# Patient Record
Sex: Female | Born: 1955 | Race: White | Hispanic: No | Marital: Married | Smoking: Never smoker
Health system: Southern US, Community
[De-identification: ages and names within clinical notes are randomized; demographics above are authoritative.]

## PROBLEM LIST (undated history)

## (undated) DIAGNOSIS — Z9115 Patient's noncompliance with renal dialysis: Secondary | ICD-10-CM

## (undated) DIAGNOSIS — I4891 Unspecified atrial fibrillation: Secondary | ICD-10-CM

## (undated) DIAGNOSIS — I671 Cerebral aneurysm, nonruptured: Secondary | ICD-10-CM

## (undated) DIAGNOSIS — Q613 Polycystic kidney, unspecified: Secondary | ICD-10-CM

## (undated) DIAGNOSIS — Z91158 Patient's noncompliance with renal dialysis for other reason: Secondary | ICD-10-CM

## (undated) DIAGNOSIS — E7800 Pure hypercholesterolemia, unspecified: Secondary | ICD-10-CM

## (undated) DIAGNOSIS — I10 Essential (primary) hypertension: Secondary | ICD-10-CM

## (undated) HISTORY — DX: Pure hypercholesterolemia, unspecified: E78.00

## (undated) HISTORY — DX: Unspecified atrial fibrillation: I48.91

## (undated) HISTORY — PX: OTHER SURGICAL HISTORY: SHX169

## (undated) HISTORY — DX: Essential (primary) hypertension: I10

## (undated) HISTORY — PX: APPENDECTOMY: SHX54

## (undated) HISTORY — PX: DG AV DIALYSIS  SHUNT ACCESS EXIST*L* OR: HXRAD910

## (undated) HISTORY — PX: THROMBECTOMY: PRO61

## (undated) HISTORY — DX: Polycystic kidney, unspecified: Q61.3

## (undated) HISTORY — DX: Cerebral aneurysm, nonruptured: I67.1

## (undated) HISTORY — PX: RIGHT OOPHORECTOMY: SHX2359

---

## 2003-04-04 ENCOUNTER — Ambulatory Visit (HOSPITAL_COMMUNITY): Admission: RE | Admit: 2003-04-04 | Discharge: 2003-04-04 | Payer: Self-pay | Admitting: Vascular Surgery

## 2003-04-04 ENCOUNTER — Encounter: Payer: Self-pay | Admitting: Vascular Surgery

## 2003-04-11 ENCOUNTER — Ambulatory Visit (HOSPITAL_COMMUNITY): Admission: RE | Admit: 2003-04-11 | Discharge: 2003-04-11 | Payer: Self-pay | Admitting: Nephrology

## 2003-04-27 ENCOUNTER — Ambulatory Visit (HOSPITAL_COMMUNITY): Admission: RE | Admit: 2003-04-27 | Discharge: 2003-04-27 | Payer: Self-pay | Admitting: Neurological Surgery

## 2003-05-09 ENCOUNTER — Ambulatory Visit (HOSPITAL_COMMUNITY): Admission: RE | Admit: 2003-05-09 | Discharge: 2003-05-09 | Payer: Self-pay | Admitting: Vascular Surgery

## 2003-06-13 ENCOUNTER — Ambulatory Visit (HOSPITAL_COMMUNITY): Admission: RE | Admit: 2003-06-13 | Discharge: 2003-06-13 | Payer: Self-pay | Admitting: Interventional Radiology

## 2003-07-31 HISTORY — PX: TOTAL KNEE ARTHROPLASTY: SHX125

## 2003-08-21 ENCOUNTER — Encounter: Admission: RE | Admit: 2003-08-21 | Discharge: 2003-08-21 | Payer: Self-pay | Admitting: Orthopedic Surgery

## 2004-01-10 ENCOUNTER — Ambulatory Visit (HOSPITAL_COMMUNITY): Admission: RE | Admit: 2004-01-10 | Discharge: 2004-01-10 | Payer: Self-pay | Admitting: Cardiology

## 2005-08-02 ENCOUNTER — Emergency Department (HOSPITAL_COMMUNITY): Admission: EM | Admit: 2005-08-02 | Discharge: 2005-08-02 | Payer: Self-pay | Admitting: Emergency Medicine

## 2005-08-19 ENCOUNTER — Inpatient Hospital Stay (HOSPITAL_COMMUNITY): Admission: AD | Admit: 2005-08-19 | Discharge: 2005-08-21 | Payer: Self-pay | Admitting: Cardiology

## 2005-08-19 ENCOUNTER — Ambulatory Visit: Payer: Self-pay | Admitting: Cardiology

## 2005-08-20 ENCOUNTER — Encounter: Payer: Self-pay | Admitting: Cardiology

## 2005-09-29 ENCOUNTER — Ambulatory Visit: Payer: Self-pay | Admitting: Cardiology

## 2005-09-29 ENCOUNTER — Inpatient Hospital Stay (HOSPITAL_COMMUNITY): Admission: AD | Admit: 2005-09-29 | Discharge: 2005-10-03 | Payer: Self-pay | Admitting: Cardiology

## 2005-09-30 ENCOUNTER — Encounter: Payer: Self-pay | Admitting: Cardiology

## 2005-10-28 ENCOUNTER — Ambulatory Visit: Payer: Self-pay | Admitting: Cardiology

## 2005-12-08 ENCOUNTER — Ambulatory Visit: Payer: Self-pay | Admitting: Cardiology

## 2006-01-20 ENCOUNTER — Ambulatory Visit: Payer: Self-pay | Admitting: Cardiology

## 2008-10-04 ENCOUNTER — Ambulatory Visit (HOSPITAL_COMMUNITY): Admission: RE | Admit: 2008-10-04 | Discharge: 2008-10-04 | Payer: Self-pay | Admitting: Nephrology

## 2008-10-22 ENCOUNTER — Ambulatory Visit: Payer: Self-pay | Admitting: Vascular Surgery

## 2008-11-22 ENCOUNTER — Ambulatory Visit: Payer: Self-pay | Admitting: Vascular Surgery

## 2008-11-22 ENCOUNTER — Ambulatory Visit (HOSPITAL_COMMUNITY): Admission: RE | Admit: 2008-11-22 | Discharge: 2008-11-22 | Payer: Self-pay | Admitting: Surgery

## 2008-12-25 ENCOUNTER — Ambulatory Visit: Payer: Self-pay | Admitting: Vascular Surgery

## 2008-12-25 ENCOUNTER — Inpatient Hospital Stay (HOSPITAL_COMMUNITY): Admission: RE | Admit: 2008-12-25 | Discharge: 2008-12-25 | Payer: Self-pay | Admitting: Vascular Surgery

## 2009-02-07 ENCOUNTER — Ambulatory Visit: Payer: Self-pay | Admitting: Vascular Surgery

## 2009-02-07 ENCOUNTER — Ambulatory Visit (HOSPITAL_COMMUNITY): Admission: RE | Admit: 2009-02-07 | Discharge: 2009-02-07 | Payer: Self-pay | Admitting: Vascular Surgery

## 2009-02-14 ENCOUNTER — Ambulatory Visit (HOSPITAL_COMMUNITY): Admission: RE | Admit: 2009-02-14 | Discharge: 2009-02-14 | Payer: Self-pay | Admitting: Nephrology

## 2009-02-15 ENCOUNTER — Ambulatory Visit (HOSPITAL_COMMUNITY): Admission: RE | Admit: 2009-02-15 | Discharge: 2009-02-15 | Payer: Self-pay | Admitting: Vascular Surgery

## 2009-05-15 ENCOUNTER — Encounter: Payer: Self-pay | Admitting: Cardiology

## 2009-05-28 DIAGNOSIS — Q613 Polycystic kidney, unspecified: Secondary | ICD-10-CM | POA: Insufficient documentation

## 2009-05-28 DIAGNOSIS — I10 Essential (primary) hypertension: Secondary | ICD-10-CM

## 2009-05-28 DIAGNOSIS — E7800 Pure hypercholesterolemia, unspecified: Secondary | ICD-10-CM

## 2009-05-28 DIAGNOSIS — I4891 Unspecified atrial fibrillation: Secondary | ICD-10-CM

## 2009-05-29 ENCOUNTER — Encounter: Payer: Self-pay | Admitting: Cardiology

## 2009-05-29 ENCOUNTER — Ambulatory Visit: Payer: Self-pay | Admitting: Cardiology

## 2009-05-29 DIAGNOSIS — N186 End stage renal disease: Secondary | ICD-10-CM

## 2009-10-10 ENCOUNTER — Ambulatory Visit (HOSPITAL_COMMUNITY): Admission: RE | Admit: 2009-10-10 | Discharge: 2009-10-10 | Payer: Self-pay | Admitting: Nephrology

## 2009-11-12 ENCOUNTER — Ambulatory Visit (HOSPITAL_COMMUNITY): Admission: RE | Admit: 2009-11-12 | Discharge: 2009-11-12 | Payer: Self-pay | Admitting: Nephrology

## 2009-11-20 ENCOUNTER — Encounter: Payer: Self-pay | Admitting: Cardiology

## 2009-12-19 ENCOUNTER — Ambulatory Visit (HOSPITAL_COMMUNITY): Admission: RE | Admit: 2009-12-19 | Discharge: 2009-12-19 | Payer: Self-pay | Admitting: Nephrology

## 2010-01-07 ENCOUNTER — Ambulatory Visit (HOSPITAL_COMMUNITY): Admission: RE | Admit: 2010-01-07 | Discharge: 2010-01-07 | Payer: Self-pay | Admitting: Nephrology

## 2010-01-14 ENCOUNTER — Ambulatory Visit (HOSPITAL_COMMUNITY): Admission: RE | Admit: 2010-01-14 | Discharge: 2010-01-14 | Payer: Self-pay | Admitting: Nephrology

## 2010-03-25 ENCOUNTER — Ambulatory Visit (HOSPITAL_COMMUNITY): Admission: RE | Admit: 2010-03-25 | Discharge: 2010-03-25 | Payer: Self-pay | Admitting: Nephrology

## 2010-05-15 ENCOUNTER — Ambulatory Visit (HOSPITAL_COMMUNITY): Admission: RE | Admit: 2010-05-15 | Discharge: 2010-05-15 | Payer: Self-pay | Admitting: Nephrology

## 2010-05-16 ENCOUNTER — Ambulatory Visit: Payer: Self-pay | Admitting: Vascular Surgery

## 2010-05-16 ENCOUNTER — Ambulatory Visit (HOSPITAL_COMMUNITY): Admission: RE | Admit: 2010-05-16 | Discharge: 2010-05-16 | Payer: Self-pay | Admitting: Nephrology

## 2010-06-03 ENCOUNTER — Ambulatory Visit (HOSPITAL_COMMUNITY)
Admission: RE | Admit: 2010-06-03 | Discharge: 2010-06-06 | Payer: Self-pay | Source: Home / Self Care | Attending: Nephrology | Admitting: Nephrology

## 2010-06-17 ENCOUNTER — Ambulatory Visit: Payer: Self-pay | Admitting: Vascular Surgery

## 2010-09-08 LAB — BASIC METABOLIC PANEL
BUN: 25 mg/dL — ABNORMAL HIGH (ref 6–23)
Chloride: 104 mEq/L (ref 96–112)
Creatinine, Ser: 7.13 mg/dL — ABNORMAL HIGH (ref 0.4–1.2)
GFR calc non Af Amer: 6 mL/min — ABNORMAL LOW (ref >60)
Glucose, Bld: 98 mg/dL (ref 70–99)
Potassium: 5.1 mEq/L (ref 3.5–5.1)

## 2010-09-08 LAB — CBC
HCT: 38.1 % (ref 36.0–46.0)
HCT: 40.4 % (ref 36.0–46.0)
HCT: 41.8 % (ref 36.0–46.0)
HCT: 42.9 % (ref 36.0–46.0)
Hemoglobin: 13.6 g/dL (ref 12.0–15.0)
MCH: 33.9 pg (ref 26.0–34.0)
MCHC: 32.4 g/dL (ref 30.0–36.0)
MCHC: 32.5 g/dL (ref 30.0–36.0)
MCV: 103.3 fL — ABNORMAL HIGH (ref 78.0–100.0)
MCV: 103.7 fL — ABNORMAL HIGH (ref 78.0–100.0)
MCV: 104.9 fL — ABNORMAL HIGH (ref 78.0–100.0)
Platelets: 111 10*3/uL — ABNORMAL LOW (ref 150–400)
Platelets: 115 10*3/uL — ABNORMAL LOW (ref 150–400)
Platelets: 131 10*3/uL — ABNORMAL LOW (ref 150–400)
RDW: 15.6 % — ABNORMAL HIGH (ref 11.5–15.5)
RDW: 15.7 % — ABNORMAL HIGH (ref 11.5–15.5)
RDW: 15.9 % — ABNORMAL HIGH (ref 11.5–15.5)
RDW: 16 % — ABNORMAL HIGH (ref 11.5–15.5)
WBC: 4.2 10*3/uL (ref 4.0–10.5)
WBC: 4.9 10*3/uL (ref 4.0–10.5)
WBC: 6.4 10*3/uL (ref 4.0–10.5)

## 2010-09-08 LAB — RENAL FUNCTION PANEL
Albumin: 3.2 g/dL — ABNORMAL LOW (ref 3.5–5.2)
Albumin: 3.2 g/dL — ABNORMAL LOW (ref 3.5–5.2)
BUN: 39 mg/dL — ABNORMAL HIGH (ref 6–23)
BUN: 50 mg/dL — ABNORMAL HIGH (ref 6–23)
Calcium: 8.7 mg/dL (ref 8.4–10.5)
Calcium: 9 mg/dL (ref 8.4–10.5)
Creatinine, Ser: 10.05 mg/dL — ABNORMAL HIGH (ref 0.4–1.2)
Creatinine, Ser: 10.95 mg/dL — ABNORMAL HIGH (ref 0.4–1.2)
GFR calc Af Amer: 4 mL/min — ABNORMAL LOW (ref >60)
GFR calc non Af Amer: 4 mL/min — ABNORMAL LOW (ref >60)
Glucose, Bld: 95 mg/dL (ref 70–99)
Phosphorus: 6.5 mg/dL — ABNORMAL HIGH (ref 2.3–4.6)

## 2010-09-08 LAB — POCT I-STAT, CHEM 8
BUN: 110 mg/dL — ABNORMAL HIGH (ref 6–23)
Calcium, Ion: 1.05 mmol/L — ABNORMAL LOW (ref 1.12–1.32)
Chloride: 105 mEq/L (ref 96–112)
Creatinine, Ser: 16.1 mg/dL — ABNORMAL HIGH (ref 0.4–1.2)
Glucose, Bld: 66 mg/dL — ABNORMAL LOW (ref 70–99)
HCT: 46 % (ref 36.0–46.0)
Potassium: 7 mEq/L (ref 3.5–5.1)
Sodium: 132 mEq/L — ABNORMAL LOW (ref 135–145)

## 2010-09-08 LAB — POTASSIUM: Potassium: 7.4 mEq/L (ref 3.5–5.1)

## 2010-09-08 LAB — PROTIME-INR
INR: 2.14 — ABNORMAL HIGH (ref 0.00–1.49)
Prothrombin Time: 24.1 seconds — ABNORMAL HIGH (ref 11.6–15.2)

## 2010-09-08 LAB — POCT I-STAT 4, (NA,K, GLUC, HGB,HCT)
Glucose, Bld: 124 mg/dL — ABNORMAL HIGH (ref 70–99)
HCT: 41 % (ref 36.0–46.0)
HCT: 42 % (ref 36.0–46.0)
Hemoglobin: 14.3 g/dL (ref 12.0–15.0)
Potassium: 4.1 mEq/L (ref 3.5–5.1)
Sodium: 132 mEq/L — ABNORMAL LOW (ref 135–145)

## 2010-09-08 LAB — APTT: aPTT: 34 seconds (ref 24–37)

## 2010-09-08 LAB — MRSA PCR SCREENING: MRSA by PCR: NEGATIVE

## 2010-09-09 LAB — PROTIME-INR: INR: 1.95 — ABNORMAL HIGH (ref 0.00–1.49)

## 2010-09-09 LAB — BASIC METABOLIC PANEL
Chloride: 95 mEq/L — ABNORMAL LOW (ref 96–112)
GFR calc non Af Amer: 2 mL/min — ABNORMAL LOW (ref >60)
Glucose, Bld: 86 mg/dL (ref 70–99)
Potassium: 6.7 mEq/L (ref 3.5–5.1)
Sodium: 134 mEq/L — ABNORMAL LOW (ref 135–145)

## 2010-09-09 LAB — POCT I-STAT 4, (NA,K, GLUC, HGB,HCT)
Glucose, Bld: 83 mg/dL (ref 70–99)
HCT: 47 % — ABNORMAL HIGH (ref 36.0–46.0)
Hemoglobin: 15.6 g/dL — ABNORMAL HIGH (ref 12.0–15.0)
Hemoglobin: 16 g/dL — ABNORMAL HIGH (ref 12.0–15.0)
Sodium: 132 mEq/L — ABNORMAL LOW (ref 135–145)

## 2010-09-09 LAB — SURGICAL PCR SCREEN
MRSA, PCR: NEGATIVE
Staphylococcus aureus: NEGATIVE

## 2010-09-09 LAB — POTASSIUM: Potassium: 5.4 mEq/L — ABNORMAL HIGH (ref 3.5–5.1)

## 2010-09-30 ENCOUNTER — Ambulatory Visit: Payer: Self-pay | Admitting: Vascular Surgery

## 2010-10-04 LAB — PROTIME-INR
Prothrombin Time: 21.7 seconds — ABNORMAL HIGH (ref 11.6–15.2)
Prothrombin Time: 15.3 seconds — ABNORMAL HIGH (ref 11.6–15.2)

## 2010-10-04 LAB — POCT I-STAT 4, (NA,K, GLUC, HGB,HCT)
Glucose, Bld: 95 mg/dL (ref 70–99)
Hemoglobin: 13.6 g/dL (ref 12.0–15.0)
Potassium: 5.5 mEq/L — ABNORMAL HIGH (ref 3.5–5.1)

## 2010-10-06 LAB — POCT I-STAT 4, (NA,K, GLUC, HGB,HCT)
HCT: 50 % — ABNORMAL HIGH (ref 36.0–46.0)
Hemoglobin: 17 g/dL — ABNORMAL HIGH (ref 12.0–15.0)
Sodium: 131 mEq/L — ABNORMAL LOW (ref 135–145)

## 2010-10-07 LAB — PROTIME-INR
INR: 1.3 (ref 0.00–1.49)
Prothrombin Time: 16.1 seconds — ABNORMAL HIGH (ref 11.6–15.2)

## 2010-10-07 LAB — CATH TIP CULTURE: Culture: 10

## 2010-10-07 LAB — POCT I-STAT 4, (NA,K, GLUC, HGB,HCT)
Glucose, Bld: 90 mg/dL (ref 70–99)
HCT: 53 % — ABNORMAL HIGH (ref 36.0–46.0)
Potassium: 5.9 mEq/L — ABNORMAL HIGH (ref 3.5–5.1)

## 2010-11-11 NOTE — Op Note (Signed)
[name] Bender, TWINING               ACCOUNT NO.:  [id number]   MEDICAL RECORD NO.:  [id number]          PATIENT TYPE:  AMB   LOCATION:  SDS                          FACILITY:  [facility]   PHYSICIAN:  Di Kindle. Edilia Bo, M.D.DATE OF BIRTH:  04/21/1956   DATE OF PROCEDURE:  02/15/2009  DATE OF DISCHARGE:  02/15/2009                               OPERATIVE REPORT   PREOPERATIVE DIAGNOSIS:  Chronic kidney disease with clotted right upper  arm arteriovenous graft.   POSTOPERATIVE DIAGNOSIS:  Chronic kidney disease with clotted right  upper arm arteriovenous graft.   PROCEDURE:  Thrombectomy of right upper arm arteriovenous graft with  revision.   SURGEON:  Di Kindle. Edilia Bo, M.D.   ASSISTANT:  Emilio Aspen, RNFA.   ANESTHESIA:  Local with sedation technique.   DESCRIPTION OF PROCEDURE:  The patient was taken to the operating room  sedated by anesthesia.  The right upper extremity was prepped and draped  in the usual sterile fashion.  A incision was made in the axilla  transversely to allow exposure of the high axillary vein.  The arterial  and venous limbs of the graft were dissected free.  I dissected up  higher on the axillary vein, essentially as high as I could get to get  above valve.  The graft was divided.  Graft thrombectomy was achieved  using a #4 Fogarty catheter.  I had problems getting the catheter  through the arterial limb of the graft, so I did divide the arterial  limb of the graft and did direct thrombectomy of the arterial limb of  the graft, it was also slightly redundant here and I excised a short  segment and then sewed the arterial end back end-to-end.  At the venous  end, the graft was redundant enough that I could spatulated and sew it  up to the more proximal axillary vein.  At the completion, there was a  good thrill in the graft.  Hemostasis was obtained in the wound.  The  wound was closed with deep layer of 3-0 Vicryl and the skin closed with  4-0 Vicryl.  Sterile dressing was applied.  The patient tolerated the  procedure well.      Di Kindle. Edilia Bo, M.D.  Electronically Signed     CSD/MEDQ  D:  02/15/2009  T:  02/16/2009  Job:  [id number]

## 2010-11-11 NOTE — Op Note (Signed)
[name] Bender, [name] Bender               ACCOUNT NO.:  [id number]   MEDICAL RECORD NO.:  [id number]          PATIENT TYPE:  INP   LOCATION:  2899                         FACILITY:  [facility]   PHYSICIAN:  Larina Earthly, M.D.    DATE OF BIRTH:  Jan 03, 1956   DATE OF PROCEDURE:  12/25/2008  DATE OF DISCHARGE:  12/25/2008                               OPERATIVE REPORT   PREOPERATIVE DIAGNOSIS:  End-stage renal disease.   POSTOPERATIVE DIAGNOSIS:  End-stage renal disease.   PROCEDURE:  Right upper arm loop arteriovenous Gore-Tex graft.   SURGEON:  Larina Earthly, MD   ASSISTANT:  Nurse.   ANESTHESIA:  MAC.   COMPLICATIONS:  None.   DISPOSITION:  To recovery room, stable.   INDICATIONS FOR PROCEDURE:  The patient is a 55 year old female with end-  stage renal disease.  She has had multiple prior access procedures.  She  had been considered for a HeRO catheter graft.  She had been assessed  with a catheter in place in her right internal jugular vein back in  April of 2010, with Dr. Myra Gianotti.  Subsequently had developed infection  in that and subsequently had replacement with a femoral graft.  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  I recommended a right arm graft.  The patient did have faint  right radial and brachial pulse.  I was concerned regarding potential  inflow issues as well.   PROCEDURE IN DETAIL:  An incision was made over the axillary pulse and  carried down to isolate the axillary vein, which was of good caliber and  axillary artery which was also of good caliber.  The patient's blood  pressure was running in the 80s to 90s with some pressure support and  did have a very poor pulse.  A separate incision was made over the  distal upper arm and a loop configuration tunnel was created.  A 6-mm  standard wall graft was brought through the tunnel.  The vein was  occluded proximally and distally and was opened with 11 blade and  extended longitudinally with Potts scissors.  The graft was spatulated  and sewn end-to-side to the vein with a running 6-0 Prolene suture.  Clamps were removed from the vein.  The graft flushed with heparinized  saline and reoccluded.  Next, the axillary artery was occluded proximal  and distally.  It was opened with 11 blade and extended longitudinally  with Potts scissors.  A small arteriotomy was created.  The graft was  cut to appropriate dimension and was sewn end-to-side of the artery with  a running 6-0 Prolene suture.  Clamps were removed and good thrill was  noted.  The wounds were irrigated with saline.  Hemostasis was achieved  with electrocautery.  Wounds were closed with 3-0 Vicryl in subcutaneous  and subcuticular tissue.  Benzoin and Steri-Strips were applied.  The  patient was taken to the recovery room in stable condition.      Larina Earthly,  M.D.  Electronically Signed     TFE/MEDQ  D:  12/25/2008  T:  12/26/2008  Job:  [id number]

## 2010-11-11 NOTE — Assessment & Plan Note (Signed)
OFFICE VISIT   ROCIO, [name] Bender  DOB:  Aug 04, 1955                                       10/22/2008  VOJJK#:[id number]   REASON FOR VISIT:  Evaluate for HeRO catheter.   HISTORY:  This is Bender 55 year old female with end-stage renal disease  secondary to polycystic kidney disease.  She has had multiple grafts  placed in the past at [facility].  This has included left-sided  fistulas, Bender left arm graft and right arm graft.  She has not had Bender right  upper arm graft, as she had been told that she was not Bender candidate to  have Bender graft placed here.  She has been dialyzing through Bender right-sided  catheter since May 2007.  This was placed in the setting of an infected  left upper extremity catheter.  She comes having undergone Bender venogram  performed by radiology.  She is eager to get her catheter removed.  She  is morbidly obese and is felt to not be Bender good candidate for dye grafts.  In addition, she has Bender very large abdominal hernia.   PAST MEDICAL HISTORY:  1. Polycystic kidney disease.  2. Atrial fibrillation, on chronic Coumadin therapy.  3. Hypertension.  4. Hypercholesterolemia.   FAMILY HISTORY:  Positive for cardiovascular disease at Bender young age in  her mother and father.   SOCIAL HISTORY:  She is married with 2 children.  She is disabled.  She  does not smoke.  She has never smoked.  She does not drink alcohol.   REVIEW OF SYSTEMS:  GENERAL:  Negative for fevers, chills, weight gain,  weight loss.  CARDIAC:  Positive for atrial fibrillation.  PULMONARY:  Negative.  GI:  Negative.  GU:  Positive for chronic kidney disease.  VASCULAR:  Negative.  NEURO:  Negative.  ORTHO:  Negative.  PSYCH:  Negative.  ENT:  Negative.  HEME:  Negative.   MEDICATIONS:  1. Renagel 800 mg 2 tablets per meal.  2. PhosLo 667 mg 1 tablet per meal.  3. Metoprolol 100 mg daily.  4. Amiodarone 200 mg daily.  5. Digoxin 125 mcg 1 tablet on Tuesday, 1 on Friday.  6. Coumadin.  7. Sensipar 30 mg b.i.d.  8. Tricor 145 mg daily.   ALLERGIES:  Ancef.   PHYSICAL EXAMINATION:  Vital Signs:  Her pulse is 70, respirations 18.  General:  She is obese, in no acute distress.  HEENT:  Normocephalic,  atraumatic.  Pupils are equal.  Cardiovascular:  Irregular.  Pulmonary:  Lungs are clear bilaterally.  Extremities:  Warm and well-perfused.  She  has Bender palpable right brachial pulse, her upper arm is obese.   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   ASSESSMENT/PLAN:  Chronic end-stage renal disease being evaluated for  HeRO catheter.   Plan:  I discussed the concept behind the HeRO catheter today.  The  patient understands and wishes to proceed.  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  I plan on using her existing right-sided  catheter as part of the HeRO.  The patient is on Coumadin.  She will  need  to have her Coumadin stopped 5 days before her procedure.  All of  this was discussed with the patient, this will be coordinated at our  earliest convenience.   Jorge Ny, MD  Electronically Signed   VWB/MEDQ  D:  10/22/2008  T:  10/23/2008  Job:  [id number]

## 2010-11-11 NOTE — Op Note (Signed)
NAMEBRAIDEN, PRESUTTI               ACCOUNT NO.:  [id number]   MEDICAL RECORD NO.:  [id number]          PATIENT TYPE:  AMB   LOCATION:  SDS                          FACILITY:  [facility]   PHYSICIAN:  Di Kindle. Edilia Bo, M.D.DATE OF BIRTH:  04/21/1956   DATE OF PROCEDURE:  11/22/2008  DATE OF DISCHARGE:  11/22/2008                               OPERATIVE REPORT   PREOPERATIVE DIAGNOSIS:  Chronic kidney disease.   POSTOPERATIVE DIAGNOSIS:  Chronic kidney disease.   PROCEDURE:  1. Ultrasound-guided placement of right femoral Diatek catheter.  2. Removal of internal jugular Diatek catheter.   SURGEON:  Di Kindle. Edilia Bo, M.D.   ASSISTANT:  Nurse.   ANESTHESIA:  Local with sedation.   TECHNIQUE:  The patient was taken to the operating room, sedated by  Anesthesia.  The groins were prepped and draped in the usual sterile  fashion.  After the skin was infiltrated with 1% lidocaine under  ultrasound guidance, the right femoral vein was cannulated and a  guidewire introduced into the inferior vena cava under fluoroscopic  control.  The tract over the wire was dilated and then the dilator and  peel-away sheath were grasped over the wire and the dilator removed.  The catheter was threaded over the wire through the peel-away sheath up  into the right atrium and then the peel-away sheath and wire were  removed.  The exit site of the catheter was selected and then the skin  anesthetized between the two areas.  The catheter was then brought  through the tunnel, cut to the appropriate length, and distal ports were  attached.  Both ports withdrew easily, were then flushed with  heparinized saline and filled with concentrated heparin.  The catheter  was secured at its exit site with a 3-0 nylon suture.  The femoral  cannulation site was closed with a 4-0 subcuticular stitch.  Next,  attention was turned to removal of the IJ catheter.  The skin was  anesthetized.  The cuff was dissected  free and the catheter easily  removed, pressure was held for hemostasis.  A single 3-0 nylon was  placed at the exit site.  The patient tolerated the procedure well, was  transferred to the recovery room in satisfactory condition.  All needle  and sponge counts were correct.      Di Kindle. Edilia Bo, M.D.  Electronically Signed     CSD/MEDQ  D:  11/22/2008  T:  11/23/2008  Job:  [id number]

## 2010-11-11 NOTE — Discharge Summary (Signed)
[name] Bender, [name] Bender               ACCOUNT NO.:  [id number]   MEDICAL RECORD NO.:  [id number]          PATIENT TYPE:  INP   LOCATION:  2899                         FACILITY:  [facility]   PHYSICIAN:  Larina Earthly, M.D.    DATE OF BIRTH:  October 26, 1955   DATE OF ADMISSION:  12/25/2008  DATE OF DISCHARGE:  12/25/2008                               DISCHARGE SUMMARY   FINAL DISCHARGE DIAGNOSIS:  End-stage renal disease.   PROCEDURE PERFORMED:  Right upper lobe arteriovenous Gore-Tex graft by  Dr. Arbie Cookey, December 25, 2008.   COMPLICATIONS:  None.   CONDITION ON DISCHARGE:  Stable, improving.   DISCHARGE MEDICATIONS:  She is instructed to resume all preoperative  medications consisting of Renagel 800 mg two tablets t.i.d. with meals,  PhosLo 667 mg p.o. with meals, metoprolol tartrate 100 mg p.o. q.p.m.,  amiodarone 200 mg p.o. q.p.m., digoxin 0.125 mg Tuesdays and Fridays,  Coumadin 2.5 mg Monday through Sundays, Sensipar 30 mg p.o. b.i.d.,  TriCor 145 mg p.o. q.p.m.   DISPOSITION:  Discharged home following surgery.   BRIEF IDENTIFYING STATEMENT:  Briefly, this woman was admitted for  hemodialysis access.  She was brought in through short stay.   HOSPITAL COURSE:  She was brought in through short stay.  She underwent  placement of a left AV loop graft in the right upper arm.  The procedure  was without complications.  She was returned to the postanesthesia care  unit.  Following stabilization, she was discharged home.      Wilmon Arms, PA      Larina Earthly, M.D.  Electronically Signed    KEL/MEDQ  D:  01/28/2009  T:  01/29/2009  Job:  [id number]

## 2010-11-14 NOTE — Op Note (Signed)
   NAME:  [name] Bender, [name] Bender                         ACCOUNT NO.:  [id number]   MEDICAL RECORD NO.:  [id number]                   PATIENT TYPE:  OIB   LOCATION:  2858                                 FACILITY:  [facility]   PHYSICIAN:  Balinda Quails, M.D.                 DATE OF BIRTH:  [birth year]   DATE OF PROCEDURE:  05/09/2003  DATE OF DISCHARGE:  05/09/2003                                 OPERATIVE REPORT   SURGEON:  Balinda Quails, M.D.   ASSISTANT:  Jerold Coombe, P.A.   ANESTHESIA:  Local with MAC.   PREOPERATIVE DIAGNOSIS:  1. End-stage renal failure.  2. Clotted left upper arm arteriovenous graft.   POSTOPERATIVE DIAGNOSIS:  1. End-stage renal failure.  2. Clotted left upper arm arteriovenous graft.   PROCEDURE:  Thrombectomy and revision of left upper arm arteriovenous graft.   DESCRIPTION OF PROCEDURE:  The patient was brought to the operating room in  stable condition.  Placed in the supine position.  Left arm prepped and  draped in the usual sterile fashion.  Skin and subcutaneous tissue instilled  with 1% Xylocaine with epinephrine.  Longitudinal skin incision made in the  left axilla.  Dissection carried down to expose the venous limb of the  graft.  This was followed down to the vein and the graft mobilized and  encircled with a vessel loop.  The vein freed proximally and controlled with  a Gregory clamp. The venous anastomosis taken down.  The vein divided  transversely.  With pseudointemal narrowing in the vein and the vein was  trimmed proximally free of this.  Graft thrombectomized several times with a  5 Fogarty catheter.  Excellent inflow obtained.  Graft filled with heparin  and saline solution and controlled with a fistula clamp. The graft was then  extended and anastomosed end-to-end to the outflow vein with running 6-0  Prolene suture.  Clamps were then removed.  Excellent flow present.  Adequate hemostasis obtained.  Needle, sponge, and instrument  counts  correct.   Subcutaneous tissue closed with running 3-0 Vicryl suture in two layers.  The skin closed with 4-0 Monocryl.  Steri-Strips applied.  The patient  transferred to the recovery room in stable condition.                                               Balinda Quails, M.D.    PGH/MEDQ  D:  05/09/2003  T:  05/10/2003  Job:  [id number]

## 2010-11-14 NOTE — H&P (Signed)
NAMEROZLYNN, LIPPOLD NO.:  [id number]   MEDICAL RECORD NO.:  [id number]          PATIENT TYPE:  INP   LOCATION:                               FACILITY:  [facility]   PHYSICIAN:  Olga Millers, M.D. LHCDATE OF BIRTH:  1955-09-08   DATE OF ADMISSION:  09/29/2005  DATE OF DISCHARGE:                                HISTORY & PHYSICAL   Ms. 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  It  should be noted that she has had atrial fibrillation first documented in  September 2004.  This was in [facility] and felt secondary to acute renal  failure.  She had a nuclear study in 2004 that showed an ejection fraction  of 44% but felt possibly inaccurate due to the patient's atrial  fibrillation.  There was breast attenuation but no ischemia.  In February  she returned to the office with recurrent palpitations and was found to be  in atrial fibrillation with a rapid ventricular response.  She was admitted  and underwent TEE-guided cardioversion.  Her TEE revealed normal LV  function.  There was moderate bi-atrial enlargement.  There was no left  atrial appendage thrombus.  There was mild mitral regurgitation and trivial  aortic insufficiency.  She subsequently underwent cardioversion to sinus  rhythm on August 20, 2005.  Since then she has done well with no dyspnea  on exertion, orthopnea, PND, pedal edema, palpitations, presyncope, syncope  or chest pain.  She returns for a follow-up.  She is again in atrial  fibrillation.   Her medications at present include:  1.  Multivitamin.  2.  Renagel 800 mg tablets two p.o. t.i.d.  3.  Fosrenol.  4.  Coumadin as directed.  5.  Tricor and Cartia XT 120 mg p.o. daily.   She has no known  drug allergies.   SOCIAL HISTORY:  She does not smoke, nor does she consume alcohol.   FAMILY HISTORY:  Significant for polycystic kidney disease and atrial  fibrillation.  Her father also had coronary disease.   PAST MEDICAL HISTORY:  Significant for hypertension, but there is no  diabetes mellitus or hyperlipidemia.  She does have a history of atrial  fibrillation.  She has a history of polycystic kidney disease and is now on  dialysis.  She has had a prior appendectomy, cesarean section and knee  surgery.  She has had a history of a right ovary removed secondary to a  cyst.   REVIEW OF SYSTEMS:  She denies any headaches or fevers or chills.  She does  state that she has had a chest cold.  There is no dysphagia, odynophagia,  melena or hematochezia.  There is no orthopnea, PND or pedal edema.  There  is no claudication noted.  The remaining systems are negative.   PHYSICAL EXAMINATION:  VITAL SIGNS:  Her physical exam today shows a blood  pressure of 88/60 and her pulse is 156.  She weighs 252 pounds.  GENERAL:  She is well-developed, chronically ill-appearing.  She is in no  acute distress.  She is somewhat obese.  She does not appear to be  depressed, and there is no peripheral clubbing.  HEENT:  Unremarkable with normal eyelids.  NECK:  Supple with a normal upstroke bilaterally, and I cannot appreciate  bruits.  There is no jugular venous distention and no thyromegaly noted.  CHEST:  Clear to auscultation, normal to expansion.  CARDIOVASCULAR:  Tachycardic rate and irregular rhythm.  There are no  murmurs, rubs or gallops noted.  ABDOMEN:  Evidence of a hernia.  There is no tenderness to palpation, and I  cannot appreciate hepatomegaly.  EXTREMITIES:  Trace edema bilaterally.  NEUROLOGIC:  Grossly intact.   Her electrocardiogram shows atrial fibrillation with a rapid ventricular  response at 156.  There is right axis deviation.  Poor R-wave progression is  noted, and a prior  septal infarct cannot be excluded.   DIAGNOSES:  1.  Recurrent atrial fibrillation.  2.  End-stage renal disease secondary to polycystic kidney disease.  3.  Coumadin therapy.  4.  Hypertension.  5.  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   PLAN:  Ms. Colton has developed recurrent atrial fibrillation.  Her heart  rate is 156.  I think the best option would be to admit the patient and  check her INR.  We will also check records from [facility] dialysis and if  she has been therapeutic with an INR of greater than 2 since discharge in  February, then we will proceed with cardioversion tomorrow morning.  Otherwise, she will need TEE-guided cardioversion.  We will then initiate  amiodarone to maintain sinus rhythm as it is clear that she is not going to  hold sinus rhythm on her own and her rate is going to be difficult to  control (her blood pressure is borderline on her present dose of Cardizem).  Note previous TSH was normal.  We will need to check baseline pulmonary  function tests and liver functions.           ______________________________  Olga Millers, M.D. [facility]     BC/MEDQ  D:  09/29/2005  T:  09/29/2005  Job:  [id number]

## 2010-11-14 NOTE — Consult Note (Signed)
[name] Bender, [name] Bender               ACCOUNT NO.:  [id number]   MEDICAL RECORD NO.:  [id number]          PATIENT TYPE:  INP   LOCATION:  2030                         FACILITY:  [facility]   PHYSICIAN:  Terrial Rhodes, M.D.DATE OF BIRTH:  May 29, 1956   DATE OF CONSULTATION:  DATE OF DISCHARGE:                                   CONSULTATION   CHIEF COMPLAINT:  Atrial fibrillation.   HISTORY OF PRESENT ILLNESS:  The patient is a pleasant 55 year old female  with a history of polycystic kidney disease with end-stage renal disease who  goes to hemodialysis at [facility] on Monday, Wednesday, Friday.  She has  had a previous history of atrial fibrillation.  She is status post DC  cardioversion but she has had a return of her atrial fibrillation.  She  denies chest pain and shortness of breath.  She has an unknown start point  of these symptoms.  Her increase in heart rate was detected today and she  was sent to [facility] for evaluation and treatment by cardiology.  Renal was asked to evaluate and help with the dialysis orders.   As far as her nephrology care, she has dialysis for four hours on Monday,  Wednesday, Friday at [facility].  Her last hemodialysis was September 28, 2005.  For access she has left chest wall catheter.  She has an old left upper  extremity graft which is nonfunctional.  Recent labs from September 16, 2005,  show sodium 136, potassium 4.7, chloride 96, creatinine 9.9, glucose 107,  albumin 3.5.  Estimated dry weight is 113 kg.   PAST MEDICAL HISTORY:  1.  Atrial fibrillation.  2.  Ventral hernia.  3.  Polycystic kidney disease with end-stage renal disease.  4.  Hypertension.  5.  History of appendectomy.  6.  History of C-section x2.  7.  History of knee surgery.  8.  History of right oophorectomy  9.  History of anemia.  10. History of hyperparathyroidism.   SOCIAL HISTORY:  No illicit drugs.   FAMILY HISTORY:  Positive history of polycystic kidney disease  and atrial  fibrillation.  Her father also has coronary artery disease.   MEDICATIONS:  1.  The patient takes Fosrenol 1000 mg p.o. t.i.d.  2.  Nephro-Vite one tablet p.o. daily.  3.  Cardizem ER 120 mg p.o. daily.  4.  Tricor 145 mg p.o. daily.  5.  Renagel 800 mg two tabs p.o. t.i.d.  6.  Coumadin 5 mg Monday and Friday, 2.5 mg on other days.   ALLERGIES:  ANCEF.   REVIEW OF SYSTEMS:  Noncontributory except for recent URI symptoms and nasal  congestion.   PHYSICAL EXAMINATION:  VITAL SIGNS:  Temperature 98, heart rate 107,  respiratory rate 18, blood pressure 103/69, O2 saturations 97% on room air.  GENERAL:  The patient is in no apparent distress.  HEENT:  Normocephalic, atraumatic.  Mucous membranes are moist.  NECK:  Supple.  CARDIOVASCULAR:  Irregular rate and rhythm.  No murmur appreciated.  PULMONARY:  Occasional cough, otherwise clear to auscultation.  Cath site is  clean  and nonerythematous on the left chest wall.  EXTREMITIES:  Left graft.  Site is without bruit.  There is trace bilateral  lower extremity edema.  ABDOMEN:  Soft, nontender, nondistended.  Positive bowel sounds.  She does  have a large IN hernia that does not appear to be incarcerated.   LABORATORY DATA:  CMET, CBC, coags, TSH, and chest x-ray are all pending.   ASSESSMENT/PLAN:  The  patient is a 55 year old with the following problems:  1.  Atrial fibrillation per Card.  2.  End-stage renal disease secondary to polycystic kidney disease.  The      patient is on the schedule for hemodialysis tomorrow.  We will arrange      for this.  See the orders.  I will provide for renal diet.  Continue her      home medications  including phosphate binders.  She is not acutely in      need of hemodialysis for right now.  She does not appear to be floridly      fluid overloaded.  3.  History of anemia.  Check CBC and continue InFeD.  4.  Hyperparathyroidism.  Continue Renagel and Fosrenol.   DISPOSITION:   Pending Cards with hemodialysis in a.m.      Dwana Curd Para March, M.D.    ______________________________  Terrial Rhodes, M.D.    GSD/MEDQ  D:  09/29/2005  T:  09/29/2005  Job:  [id number]   cc:   Wilber Bihari. Caryn Section, M.D.  Fax: [phone]   [facility]  Attn:  Dr. Caryn Section

## 2010-11-14 NOTE — Discharge Summary (Signed)
NAMEMARGARIE, [name] Bender               ACCOUNT NO.:  [id number]   MEDICAL RECORD NO.:  [id number]          PATIENT TYPE:  INP   LOCATION:  2023                         FACILITY:  [facility]   PHYSICIAN:  Olga Millers, M.D. [facility] OF BIRTH:  1956/04/13   DATE OF ADMISSION:  08/19/2005  DATE OF DISCHARGE:  08/21/2005                                 DISCHARGE SUMMARY   PRIMARY CARDIOLOGIST:  Olga Millers, M.D. [facility]   PRINCIPAL DIAGNOSIS:  Atrial fibrillation with rapid ventricular response.   SECONDARY DIAGNOSES:  1.  End-stage renal disease secondary to polycystic kidney disease.  2.  Chronic anticoagulation.  3.  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  4.  Hypertension.  5.  Obesity.   ALLERGIES:  NO KNOWN DRUG ALLERGIES.   PROCEDURE:  TEE-guided cardioversion and dialysis.   HISTORY OF PRESENT ILLNESS:  The patient is a 55 year old white female with  prior history of atrial fibrillation, status post cardioversion in July of  2005, who was in her usual state of health until August 17, 2005 while at  dialysis when she noted recurrent palpitations.  She was noted to be atrial  fibrillation with rapid ventricular response.  She was subsequently seen by  Dr. Jens Som in the clinic on August 19, 2005, and the decision was made  to admit her for TEE-guided guided cardioversion.   HOSPITAL COURSE:  Following admission, she was initiated on renal dose  digoxin therapy, and her diltiazem was increased from four days a week to  120 mg daily.  She was scheduled for TEE and cardioversion.  TEE on August 20, 2005 revealed normal left ventricular function with moderate left atrial  enlargement, without any left atrial or atrial appendage thrombus.  She was  then successfully cardioverted with 120 joules, restoring sinus rhythm  without any immediate complications.   Post-cardioversion, she underwent dialysis and is being discharged home this  evening in satisfactory  condition.   DISCHARGE LABORATORY DATA:  Hemoglobin 10.1, hematocrit 30.2, WBC 3.9,  platelets 125.  PT 29.8, INR 2.8.  Sodium 137, potassium 4.4, chloride 98,  CO2 28, BUN 37, creatinine 9.6, glucose 85.  Total bilirubin 1.0, alkaline  phosphatase 51, AST 19, ALT 19, total protein 6.5, albumin 2.8, calcium 9.8,  phosphorous 4.9, magnesium 2.7.  TSH 2.297.   DISPOSITION:  The patient is being discharged home today in good condition.   FOLLOW UP:  She will resume her Monday, Wednesday, Friday dialysis schedule  starting tomorrow and has a followup appointment with Dr. Olga Millers on  September 07, 2005 at 10:45 a.m.   DISCHARGE MEDICATIONS:  1.  Coumadin as previously prescribed.  2.  Diltiazem ER 120 mg daily.  3.  Tricor 145 mg daily.  4.  Multivitamin one daily.  5.  Digoxin 0.125 mg every Monday, Wednesday, and Friday.  6.  Renagel 800 mg, two tablets t.i.d.   OUTSTANDING LABORATORY STUDIES:  None.   DURATION OF DISCHARGE ENCOUNTER:  40 minutes, including physician time.      Ok Anis, NP    ______________________________  Olga Millers,  M.D. [facility]    CRB/MEDQ  D:  08/20/2005  T:  08/21/2005  Job:  [id number]   cc:   Olga Millers, M.D. [facility]  [address]  [address]  [place]  [place] [address]

## 2010-11-14 NOTE — Discharge Summary (Signed)
[name] Bender, [name] Bender               ACCOUNT NO.:  [id number]   MEDICAL RECORD NO.:  [id number]          PATIENT TYPE:  INP   LOCATION:  2030                         FACILITY:  [facility]   PHYSICIAN:  Olga Millers, M.D. [facility]ATE OF BIRTH:  [birth year]   DATE OF ADMISSION:  09/29/2005  DATE OF DISCHARGE:  10/03/2005                           DISCHARGE SUMMARY - REFERRING   ADDENDUM:  Discharge time greater than 30 minutes.      Joellyn Rued, P.A. [facility]    ______________________________  Olga Millers, M.D. [facility]    EW/MEDQ  D:  10/03/2005  T:  10/03/2005  Job:  [id number]

## 2010-11-14 NOTE — H&P (Signed)
NAMEARLISA, LECLERE NO.:  [id number]   MEDICAL RECORD NO.:  [id number]          PATIENT TYPE:  INP   LOCATION:  2023                         FACILITY:  [facility]   PHYSICIAN:  Olga Millers, M.D. [facility] OF BIRTH:  1956/02/26   DATE OF ADMISSION:  08/19/2005  DATE OF DISCHARGE:                                HISTORY & PHYSICAL   HISTORY OF PRESENT ILLNESS:  Mrs. Marsan is a pleasant 56 year old female  with a past medical history of atrial fibrillation, end-stage renal disease,  polycystic kidney disease, Coumadin therapy, history of small aneurysm of  the middle cerebral artery, hypertension, who presents with recurrent atrial  fibrillation.  The patient's cardiac history dates back to September 2004,  when she was admitted to [facility] with complaints of shortness of  breath and volume overload.  She was found to be in acute renal failure  secondary to polycystic kidney disease.  She developed atrial fibrillation  with a rapid ventricular response at that time and treated with Cardizem and  Lopressor, as well as Coumadin.  She subsequently was seen by me in October.  A nuclear study in October 2004, showed an ejection fraction of 44%, but was  felt to possibly be inaccurate secondary to the patient's atrial  fibrillation.  There was probable breast attenuation, but no ischemia.  Her  most recent echocardiogram was performed in September 2005, and showed  vigorous left ventricular systolic function with an ejection fraction of 65  to 70%, and mild left atrial enlargement.  There was mild right ventricular  enlargement.  There was mild mitral regurgitation.  The patient underwent  cardioversion to sinus rhythm in July 2005.  Since then, she has done well.  There typically is no dyspnea on exertion, orthopnea, PND, pedal edema,  palpitations, presyncope, syncope, or exertional chest pain.  This past  Monday, she was at dialysis and felt recurrent  palpitations and was noted to  be in atrial fibrillation with a rapid ventricular response.  She was  referred to cardiology for further evaluation.  She is asymptomatic other  than her palpitations.   MEDICATIONS:  1.  Dialysis on Mondays, Wednesdays, and Fridays.  2.  Multivitamin one p.o. daily.  3.  Renagel 800 mg tablets two p.o. t.i.d.  4.  Post-renal Cartia XT 120 mg on Tuesdays, Thursdays, Saturday's, and      Sunday's.  5.  Coumadin as directed.  6.  Tricor.   SOCIAL HISTORY:  She does not smoke or consume alcohol.   ALLERGIES:  No known drug allergies.   FAMILY HISTORY:  Significant for polycystic kidney disease and a history of  atrial fibrillation as well.  Her father also had coronary disease.   PAST MEDICAL HISTORY:  1.  There is no diabetes mellitus or hyperlipidemia, but there has been a      past medical history of hypertension.  2.  Atrial fibrillation as described in the HPI.  3.  History of polycystic kidney disease and is now on dialysis.  4.  Prior knee surgery.  5.  Prior cesarean section x2.  6.  Prior appendectomy.  7.  Removal of her right ovary secondary to a cyst.   REVIEW OF SYSTEMS:  There is no headaches, fevers, or chills.  There is no  productive cough or hemoptysis.  There is no dysphagia, odynophagia, melena,  or hematochezia.  The remaining systems are negative.   PHYSICAL EXAMINATION:  VITAL SIGNS:  Blood pressure of 98/64, pulse 154.  She weighs 248 pounds.  GENERAL:  She is well-developed and chronically ill-appearing.  She is in no  acute distress.  SKIN:  Warm and dry.  PSYCHIATRIC:  She does not appear to be depressed.  EXTREMITIES:  There is no peripheral clubbing.  No edema and I can palpate  no cords.  She has diminished pulses distally.  HEENT:  Unremarkable with normal eyelids.  NECK:  Supple with normal upstrokes bilaterally and I cannot appreciate  bruits.  CHEST:  Clear to auscultation and percussion.  CARDIOVASCULAR:   Tachycardic rate and irregular rhythm.  I cannot appreciate  murmurs, rubs, or gallops.  ABDOMEN:  Nontender, nondistended, positive bowel sounds, no  hepatosplenomegaly, no masses appreciated.  NEUROLOGIC:  Grossly intact.   LABORATORY DATA:  Her electrocardiogram shows atrial fibrillation with a  rapid ventricular response at 154.  There is right axis deviation.  There is  low voltage.  A prior septal infarct cannot be excluded and there are  nonspecific ST changes.   DIAGNOSES:  1.  Recurrent atrial fibrillation with a rapid ventricular response.  2.  End-stage renal disease secondary to polycystic kidney disease.  3.  Coumadin therapy.  4.  History of small right middle cerebral aneurysm.   PLAN:  Mrs. Slaven has developed recurrent atrial fibrillation and her rate  is in the 150s today.  Her blood pressure is also borderline.  I will  increase her Cardizem to 120 mg p.o. daily as tolerated by blood pressure.  I will also add renal dosed digoxin.  I think the most appropriate course  would be to proceed with TEE-guided cardioversion tomorrow morning as long  as her INR is therapeutic.  If she has recurrent problems with atrial  fibrillation in the future, then she will need an anti-arrhythmic.  We will  reassess her left ventricular function at the time of her TEE.  I will also  check a TSH.           ______________________________  Olga Millers, M.D. [facility]     BC/MEDQ  D:  08/19/2005  T:  08/19/2005  Job:  [id number]

## 2010-11-14 NOTE — Op Note (Signed)
NAME:  [name] Bender, [name] Bender                         ACCOUNT NO.:  [id number]   MEDICAL RECORD NO.:  [id number]                   PATIENT TYPE:  OIB   LOCATION:  2859                                 FACILITY:  [facility]   PHYSICIAN:  Balinda Quails, M.D.                 DATE OF BIRTH:  04-Apr-1956   DATE OF PROCEDURE:  04/04/2003  DATE OF DISCHARGE:                                 OPERATIVE REPORT   PREOPERATIVE DIAGNOSIS:  Chronic renal insufficiency.   POSTOPERATIVE DIAGNOSIS:  Chronic renal insufficiency.   PROCEDURE:  Insertion of left upper arm arteriovenous graft.   SURGEON:  Balinda Quails, M.D.   ASSISTANT:  Coral Ceo, P.A.   ANESTHESIA:  Local with MAC.   ANESTHESIOLOGIST:  Judie Petit, M.D.   CLINICAL NOTE:  This is a 55 year old female with polycystic kidney disease  and failing renal function.  Referred for placement of hemodialysis access.   OPERATIVE PROCEDURE:  The patient brought to the operating room in stable  condition.  Placed in the supine position.   Left arm prepped and draped in a sterile fashion.   Skin and subcutaneous tissue instilled with 1% Xylocaine with epinephrine.  A longitudinal skin incision made over the cephalic vein at the left wrist.  Dissection carried down to expose only very small vessels.   A transverse left antecubital incision was then made.  Dissection carried  down through this.  The antecubital veins were also extremely small.  The  fascia incised and the brachial artery exposed.  This was small in caliber  and encircled with vessel loops.   Skin and subcutaneous tissue in the left axilla instilled with 1% Xylocaine.  A longitudinal skin incision made.  Dissection carried down to expose the  basilic vein.  This was moderately small in caliber.  The vein was encircled  with a vessel loop.   A curvilinear tunnel made between the two incisions.  A 4 x 7 mm Gore-Tex  graft placed through the tunnel.   The brachial artery  controlled proximally and distally with clamps.  The  longitudinal arteriotomy made.  The 4 mm end of the graft beveled and  anastomosed end-to-side to the brachial artery with running 6-0 Prolene  suture.  The artery then flushed and the graft controlled with a fistula  clamp.   The basilic vein controlled proximally and distally with clamps.  Ligated  distally with 2-0 silk.  Divided transversely.  An end-to-end anastomosis  between the 7 mm end of the graft and the basilic vein carried out with  running 6-0 Prolene suture.  Clamps were then removed.   The graft revealed adequate flow.   Adequate hemostasis obtained.  The sponge, instrument counts correct.   The subcutaneous tissue closed with running 3-0 Vicryl in all incisions.  Skin closed with 4-0 Monocryl.  Steri-Strips applied.   The patient tolerated the procedure well.  Transferred to the recovery room  in stable condition.                                                Balinda Quails, M.D.    PGH/MEDQ  D:  04/04/2003  T:  04/04/2003  Job:  [id number]

## 2010-11-14 NOTE — Assessment & Plan Note (Signed)
[facility]                              CARDIOLOGY OFFICE NOTE   LATRICIA, CERRITO                      MRN:          [id number]  DATE:01/20/2006                            DOB:          1956/04/03    Mrs. Umble returns for followup today.  She has a history of atrial  fibrillation.  Since I last saw her, she is doing well.  She denies any  dyspnea, chest pain, palpitations, or syncope.  Note she is scheduled to  have a fistula placed in her right upper extremity in early August 2007.   MEDICATIONS AT PRESENT:  1.  Multivitamin.  2.  Renagel 800 mg tablets 2 p.o. t.i.d.  3.  Fosrenol.  4.  Coumadin.  5.  TriCor 145 mg p.o. daily.  6.  Amiodarone 400 mg p.o. daily.  7.  Toprol 100 mg p.o. daily.  8.  Digoxin 0.125 mg p.o. on Tuesdays and Fridays.   PHYSICAL EXAMINATION:  VITAL SIGNS:  Blood pressure 112/84, pulse 79.  She  weights 256 pounds.  NECK:  Supple.  CHEST:  Clear.  CARDIOVASCULAR:  Irregular rhythm.  EXTREMITIES:  Show chronic skin changes and trace to 1+ edema.   Electrocardiogram shows atrial fibrillation at a rate of 93.  There is right  axis deviation.  A prior anterior infarct cannot be excluded.  There are  nonspecific ST changes.   DIAGNOSES:  1.  Atrial fibrillation.  2.  Coumadin therapy.  3.  Amiodarone therapy.  4.  End-stage renal disease secondary to polycystic kidney disease.  5.  History of small aneurysm in the right middle cerebral artery.  6.  Hypertension.   PLAN:  Mrs. Morandi is doing well from a symptomatic standpoint, and her  heart rate is much better.  She remains on amiodarone 400 mg p.o. daily, and  I have asked her to decrease this to 200 mg p.o. daily.  We will check liver  functions and a TSH today.  She did have a chest x-ray back in May 2007.  I  will also check a digoxin level to make sure that she is not becoming toxic,  given her end-stage renal disease and amiodarone use.  She is  scheduled to  have a fistula placed in her upper extremity for dialysis in early August  2007.  She will most likely need to come off Coumadin for that procedure,  and we will therefore resume after the procedure, and once she has been  therapeutic for 3 weeks we will proceed with outpatient cardioversion.  I  will  also refer her to Dr. Sampson Goon at [facility] for consideration of atrial  fibrillation ablation.  We will see her back in 4 months.                              Madolyn Frieze Jens Som, MD, [facility]    BSC/MedQ  DD:  01/20/2006  DT:  01/20/2006  Job #:  [id number]

## 2010-11-14 NOTE — Consult Note (Signed)
[name] Bender, [name] Bender               ACCOUNT NO.:  [id number]   MEDICAL RECORD NO.:  [id number]          PATIENT TYPE:  INP   LOCATION:  2023                         FACILITY:  [facility]   PHYSICIAN:  Devra Dopp, MD     DATE OF BIRTH:  29-Aug-1955   DATE OF CONSULTATION:  DATE OF DISCHARGE:                                   CONSULTATION   REASON FOR CONSULTATION:  Medical management of end stage renal disease.   REASON FOR ADMISSION:  Is atrial fibrillation with biventricular response.   HISTORY OF PRESENT ILLNESS:  This is a 55 year old white female with a  several day history of shortness of breath and tachycardia, seen by Dr.  Jens Som this afternoon. He found [name] Bender to be in atrial fibrillation  with RVR and some shortness of breath having had a sub therapeutic INR on  hemodialysis last week. The patient was admitted and renal consultation for  management of her hemodialysis and many medical issues was made.   PAST MEDICAL HISTORY:  1.  End stage renal disease secondary to polycystic kidney disease. First      hemodialysis on March 12, 2003.  2.  Hypertension.  3.  History of atrial fibrillation and ventricular tachycardia. Cardiologist      was Dr. Jens Som.  4.  Anemia.  5.  Secondary hyperparathyroidism.  6.  Morbid obesity.   PAST SURGICAL HISTORY:  Bilateral nephrectomy and ventral hernia repair on  September, 2006. Two C sections, appendectomy, a right total knee  replacement in February, 2005, hysterectomy with a right oophorectomy.   ALLERGIES:  Her allergies are ANCEF, which induces vomiting.   MEDICATIONS:  Her medications at home are:  Renagel 800 mg 2 t.i.d., a.c.  Coumadin 5 mg Monday, and Friday, 2.5 on other days.  Fosphonal 1 t.i.d. with meals.  Tricor 150 mg q.h.s.  Cardia 120 mg on non hemodialysis days.  Dialyvite 800 mg daily.   Hemodialysis is done at [facility]. Her last session was on February 19, she  regularly goes on Monday, Wednesday  and Fridays.   REVIEW OF SYSTEMS:  As per HPI. Additionally she denies any current  shortness of breath, chest pain, nausea or vomiting and has been going to  hemodialysis regularly but missed this morning session.   PHYSICAL EXAMINATION:  VITAL SIGNS: 150 to 160s. Temperature: 98.7.  Respiratory rate: 18. Blood pressure: 106/74. 97% O2 sat's on room air.  WEIGHT: 252 pounds.  GENERAL: In general she is an obese white female in no acute distress. Alert  and oriented x3. Appropriate mood and affect.  HEENT: Normocephalic/atraumatic. No sclerae icterus.  CARDIOVASCULAR: Irregularly/irregular, tachycardiac.  RESPIRATIONS: Clear to auscultation, no wheezes.  ABDOMEN: Obese.  EXTREMITIES: Trace lower extremity edema.   LABORATORY DATA:  Pending at the time of dictation.   ASSESSMENT/PLAN:  This is a 55 year old white female with:  1.  Atrial fibrillation with RVR management per cards .  2.  End stage renal disease secondary to polycystic kidney disease, we will      dialyze her in the morning, if she is here beyond  Thursday we will      dialyze her again on Friday to get her back on her regular schedule.  3.  Hypertension, mange her with cards; secondary hypoparathyroidism, on      Fosphonal and Renagel and Hectoral .  4.  Anemia receiving Epogen.  5.  Hyperlipidemia, continue Tricor.      Devra Dopp, MD     TH/MEDQ  D:  08/19/2005  T:  08/20/2005  Job:  [id number]

## 2010-11-14 NOTE — Discharge Summary (Signed)
[name] Bender, [name] Bender               ACCOUNT NO.:  [id number]   MEDICAL RECORD NO.:  [id number]          PATIENT TYPE:  INP   LOCATION:  2030                         FACILITY:  [facility]   PHYSICIAN:  Olga Millers, M.D. LHCDATE OF BIRTH:  1956-06-18   DATE OF ADMISSION:  09/29/2005  DATE OF DISCHARGE:  10/03/2005                           DISCHARGE SUMMARY - REFERRING   DISCHARGE DIAGNOSES:  1.  Recurrent atrial fibrillation, status post TEE cardioversion on September 30, 2005.  2.  Amiodarone loading.  3.  Thrombocytopenia of uncertain etiology.  4.  End-stage renal disease on hemodialysis on Monday, Wednesday, and      Friday.   History as noted below.   PROCEDURES:  TEE cardioversion, on September 30, 2005, by Dr. Dietrich Pates, restoring  normal sinus rhythm.   SUMMARY OF HISTORY:  [name] Bender is a 55 year old female with a long history  of atrial fibrillation.  At the time of followup with Dr. Jens Som, on the  day of admission, she was again in atrial fibrillation.  She denies any  problems with dyspnea on exertion, orthopnea, PND, edema, palpitations,  syncope, or chest discomfort.   Her history is notable for:  1.  End-stage renal disease with hemodialysis at [facility], Monday,      Wednesday and Friday.  2.  Polycystic kidney disease.  3.  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  4.  Hypertension.  5.  Atrial fibrillation, first documented September 2004.  Stress at that      time showed an EF of 44%, however, it was noted the patient was in      atrial fibrillation.  No ischemia.  The patient underwent TEE      cardioversion, in February 2007, and TEE showed normal LV function with      bilateral atrial enlargement.  She also underwent cardioversion to sinus      rhythm, on August 20, 2005, and has done well since that time.   LABORATORY DATA:  Chest x-ray, on September 29, 2005, showed stable slight  cardiomegaly with submaximal inspiration, no active disease.  Admission  weight was 252.1 pounds.  H&H was 14.3 and 42.8, MCV was slightly elevated  at 104.5, platelets 162, WBCs 5.4.  Prior to discharge, on September 30, 2005,  H&H remained stable, platelets had dropped to 86.  On October 01, 2005,  platelets were 85, on October 02, 2005 platelets were 103, and on October 03, 2005  platelets were 86.  At the time of discharge, on October 03, 2005, H&H was also  12.8 and 36.7, MCV 102.8, WBC 4.1.  Admission PT was 21.1, INR 1.8.  On  October 01, 2005, INR was 2.1.  On October 02, 2005, INR was 2.3, and on October 03, 2005 INR was 2.3.  Admission sodium was 142, potassium 4.8, BUN 28,  creatinine 7.7.  Normal LFTs.  On September 30, 2005, her potassium was elevated  at 5.9.  BUN was 35, creatinine 8.1.  TSH, on September 29, 2005, was 2.293.  Heparin antibody screen was negative on October 01, 2005.   HOSPITAL COURSE:  Dr. Jens Som admitted [name] Bender to the hospital for  anticipated cardioversion.  He obtained records from the hemodialysis center  to assure the PT INR have been therapeutic for the proceeding several weeks  and to being amiodarone anticipate cardioversion.  If her INR has not been  therapeutic, he would perform TEE cardioversion.  A renal consult was  obtained, on September 29, 2005, to assist with hemodialysis __________  .  Dr.  Jens Som reviewed her PT INR and they have been sub-therapeutic, thus it was  felt the TEE cardioversion was necessary.  Her Coumadin was increased while  in the hospital.  Amiodarone was continued.  TEE cardioversion was  performed, on September 30, 2005, by Dr. Dietrich Pates.  There was not any thrombus  and restored rhythm to a sinus tachycardia.  The patient underwent dialysis  later that afternoon.  She was maintained on IV heparin.  Platelets were  noted to be low.  Dr. Jens Som ordered a heparin antibody which was  negative.  On October 01, 2005, Dr. Jens Som began discharge planning.  Given  her amiodarone, he felt that she could probably be discharged home on a   slightly lower Coumadin dose that she has been on while in the hospital.  On  October 02, 2005, she underwent hemodialysis without difficulty.  On October 03, 2005, INR was 2.3, platelets were 85.  EKG recording showed a prolonged QT  interval, however, Dr. Myrtis Ser felt that her QT was not actually prolonged.  After review, a long discussion with the patient it was felt that she could  be discharged home with early followup on her PT INR, platelets, EKG with  Dr. Jens Som.   DISPOSITION:  [name] Bender is discharged home.   She is asked to maintain a renal diet.   Her activities are not restricted.   Renal placed information on her pink sheet which includes dry weight of 113  kg, Monday, Wednesday and Friday dialysis at [facility], no changes to her  hemodialysis orders.   Her renal medications include:  1.  __________ 1,000 mg three times a day.  2.  Nephro-Vite one daily.  3.  Renagel 800 mg two tablets three times a day.  4.  Hectorol 0.5 mcg IV on Monday, Wednesday and Friday with hemodialysis.  5.  Epogen 5,000 units IV Monday, Wednesday and Friday with hemodialysis.  6.  InFeD 150 mg IV once a week with hemodialysis.  7.  TriCor 145 mg every day.   From a cardiac perspective, she is asked to:  1.  Continue her Coumadin 5 mg 1/2 a tablet every day except for on Monday      and Friday to take 5 mg.  2.  Cartia XT 120 mg every day.  3.  She has received a new prescription for amiodarone 400 mg every day.      (Dr. Jens Som notes in the chart that in approximately one month, he      will decrease this to 200 mg every day).   During hemodialysis on Monday, we have asked her to obtain a PT INR and a  platelet count and send to Dr. Ludwig Clarks nurse, [facility].  On Tuesday, she is  asked to come by Dr. Ludwig Clarks office to obtain an EKG.  She is also  instructed the office will call her Monday or Tuesday with an early followup appointment with Dr. Jens Som.      Joellyn Rued, P.A.  L[facility]    ______________________________  Olga Millers, M.D. [facility]    EW/MEDQ  D:  10/03/2005  T:  10/03/2005  Job:  [id number]   cc:   Olga Millers, M.D. [facility]  [address]  [address]  [place]  [place] [address]   Wilber Bihari. Caryn Section, M.D.  Fax: [phone]

## 2010-11-14 NOTE — Op Note (Signed)
[name] Bender, [name] Bender               ACCOUNT NO.:  [id number]   MEDICAL RECORD NO.:  [id number]          PATIENT TYPE:  INP   LOCATION:  2023                         FACILITY:  [facility]   PHYSICIAN:  Olga Millers, M.D. LHCDATE OF BIRTH:  1956-06-26   DATE OF PROCEDURE:  08/20/2005  DATE OF DISCHARGE:                                 OPERATIVE REPORT   CARDIOVERSION OF ATRIAL FIBRILLATION   The patient was sedated with pentothal 150 mg intravenously by anesthesia.  Synchronized cardioversion with 120 joules (biphasic) resulted in normal  sinus rhythm with PACs. There were no immediate complications. We will  recommend continuing Coumadin.           ______________________________  Olga Millers, M.D. [facility]     BC/MEDQ  D:  08/20/2005  T:  08/20/2005  Job:  [id number]

## 2010-11-18 ENCOUNTER — Ambulatory Visit (INDEPENDENT_AMBULATORY_CARE_PROVIDER_SITE_OTHER): Payer: Medicare Other | Admitting: Vascular Surgery

## 2010-11-18 DIAGNOSIS — N186 End stage renal disease: Secondary | ICD-10-CM

## 2010-11-19 NOTE — Assessment & Plan Note (Signed)
OFFICE VISIT  [name] Bender, [name] DOB:  02/22/1956                                       11/18/2010 EAVWU#:[id number]  Patient is a 55 year old female with end-stage renal disease on chronic hemodialysis on Monday, Wednesday, and Friday.  She is referred by Dr. Briant Cedar for further vascular access.  She has a very difficult access situation.  She has had multiple grafts in both upper extremities, mostly performed at [facility] in [facility].  She has occluded innominate veins and is not a candidate for any further grafts on the left.  She has had multiple grafts on the right which have failed, and the only potential option on the right would be a HeRO catheter with a graft.  They are not certain that her proximal veins on the right are now opened, although a venogram in 2007 revealed them to be open.  She has never had thigh grafts.  CHRONIC MEDICAL PROBLEMS: 1. Polycystic kidney disease. 2. Chronic atrial fibrillation on chronic Coumadin, 5 mg on Monday and     Friday, and 2.5 mg on other days of the week. 3. Hypertension. 4. Hyperlipidemia.  FAMILY HISTORY:  Positive for cardiovascular disease at a young age.  SOCIAL HISTORY:  She is married with 2 children.  Is disabled.  Does not use alcohol, never has.  Does not use alcohol.  REVIEW OF SYSTEMS:  Negative for chest pain.  Does have dyspnea on exertion.  Positive for atrial fibrillation, chronic kidney disease. All other systems are negative in complete review of systems.  PHYSICAL EXAMINATION:  Blood pressure is 124/40, heart rate 69, respirations 20.  General:  She is an obese middle-aged female in no apparent distress, alert and oriented x3.  HEENT:  Normal for age.  EOMs intact.  Chest:  Clear to auscultation.  No rhonchi or wheezing. Cardiovascular:  An irregularly irregular rhythm.  No murmurs.  Carotid pulses are 3+.  Abdomen:  Obese with a large ventral hernia.  Lower extremity exam  reveals 3+ femoral pulses bilaterally.  Both feet are well-perfused.  There are multiple clotted access sites in both upper extremities.  I think the best plan would be to attempt a thigh graft on the right. Although she is obese and has a large ventral hernia, she does not have a large panniculus overhanging her right inguinal area.  I discussed with her the increased infection rate with this and potential steal, and she would like proceed.  I have scheduled this for Thursday, May 31.  We will discontinue her Coumadin for 5 days preoperatively and then resume it postop.    Quita Skye Hart Rochester, M.D. Electronically Signed  JDL/MEDQ  D:  11/18/2010  T:  11/19/2010  Job:  [id number]

## 2010-12-18 ENCOUNTER — Ambulatory Visit (HOSPITAL_COMMUNITY): Admission: RE | Admit: 2010-12-18 | Payer: Medicare Other | Source: Ambulatory Visit | Admitting: Vascular Surgery

## 2011-02-10 ENCOUNTER — Encounter: Payer: Self-pay | Admitting: Cardiology

## 2011-02-26 ENCOUNTER — Inpatient Hospital Stay (HOSPITAL_COMMUNITY)
Admission: RE | Admit: 2011-02-26 | Discharge: 2011-02-26 | DRG: 673 | Disposition: A | Discharge status: Home / Self Care | Payer: Medicare Other | Source: Ambulatory Visit | Attending: Vascular Surgery | Admitting: Vascular Surgery

## 2011-02-26 DIAGNOSIS — N186 End stage renal disease: Secondary | ICD-10-CM

## 2011-02-26 DIAGNOSIS — E669 Obesity, unspecified: Secondary | ICD-10-CM | POA: Diagnosis present

## 2011-02-26 DIAGNOSIS — I120 Hypertensive chronic kidney disease with stage 5 chronic kidney disease or end stage renal disease: Secondary | ICD-10-CM

## 2011-02-26 DIAGNOSIS — I4891 Unspecified atrial fibrillation: Secondary | ICD-10-CM | POA: Diagnosis present

## 2011-02-26 DIAGNOSIS — Z79899 Other long term (current) drug therapy: Secondary | ICD-10-CM

## 2011-02-26 DIAGNOSIS — Z91040 Latex allergy status: Secondary | ICD-10-CM

## 2011-02-26 DIAGNOSIS — Z7901 Long term (current) use of anticoagulants: Secondary | ICD-10-CM

## 2011-02-26 DIAGNOSIS — Z992 Dependence on renal dialysis: Secondary | ICD-10-CM

## 2011-02-26 LAB — SURGICAL PCR SCREEN
MRSA, PCR: NEGATIVE
Staphylococcus aureus: POSITIVE — AB

## 2011-02-26 LAB — POCT I-STAT 4, (NA,K, GLUC, HGB,HCT)
Glucose, Bld: 105 mg/dL — ABNORMAL HIGH (ref 70–99)
Potassium: 4.5 mEq/L (ref 3.5–5.1)
Sodium: 136 mEq/L (ref 135–145)

## 2011-02-26 LAB — PROTIME-INR: Prothrombin Time: 16.5 seconds — ABNORMAL HIGH (ref 11.6–15.2)

## 2011-03-16 NOTE — Op Note (Signed)
  NAMEKELCI, Bender NO.:  [id number]  MEDICAL RECORD NO.:  [id number]  LOCATION:  2899                         FACILITY:  [facility]  PHYSICIAN:  Quita Skye. Hart Rochester, M.D.  DATE OF BIRTH:  [birth year]  DATE OF PROCEDURE:  02/26/2011 DATE OF DISCHARGE:                              OPERATIVE REPORT   PREOPERATIVE DIAGNOSIS:  End-stage renal disease.  POSTOPERATIVE DIAGNOSIS:  End-stage renal disease.  OPERATION:  Insertion of a right thigh arteriovenous graft from superficial femoral artery to saphenous vein with 6 mm Gore-Tex.  SURGEON:  Quita Skye. Hart Rochester, MD  FIRST ASSISTANT:  Della Goo, PA-C  ANESTHESIA:  LMA and general.  DESCRIPTION OF PROCEDURE:  The patient was taken to the operating room and placed in a supine position at which time a satisfactory general-LMA anesthesia was administered.  The right groin and thigh areas were prepped with Betadine scrub and solution and draped in routine sterilemanner.  A short longitudinal incision was made just distal to the inguinal crease.  Superficial femoral artery was dissected free from its origin distally about 5 cm, encircled with vessel loops.  It had an excellent pulse and was relatively free of disease.  The saphenous vein was then exposed in the same incision, dissected up to the saphenofemoral junction, and it was a 5-mm vein.  A curvilinear tunnel was created using a small counterincision at the apex of the loop.  A 6- mm Gore-Tex graft delivered through the tunnel.  No heparin was given. Superficial femoral artery was occluded proximally and distally, opened with #15 blade, and extended with Potts scissors.  Gore-Tex anastomosed end-to-side with 6-0 Prolene.  Clamps were released.  There was an excellent pulse in the distal superficial femoral artery.  Saphenous vein was then ligated distally, opened with #15 blade, and extended with Potts scissors.  It would easily accept a 5 dilator.  Venotomy  was extended up to a point about 3 cm proximal to the saphenofemoral junction.  Gore-Tex was spatulated and anastomosed end-to-side with 6-0 Prolene.  Clamps were released.  There was a pulse and thrill in the graft, although the patient's blood pressure was only in the mid 90s as it had been throughout the case.  Adequate hemostasis was achieved.  The wounds were closed in layers with Vicryl in a subcuticular fashion with Dermabond.  The patient was taken to the recovery room in stable condition.    Quita Skye Hart Rochester, M.D.    JDL/MEDQ  D:  02/26/2011  T:  02/26/2011  Job:  [id number]  Electronically Signed by Josephina Gip M.D. on 03/16/2011 02:15:38 PM

## 2011-03-31 ENCOUNTER — Other Ambulatory Visit (HOSPITAL_COMMUNITY): Payer: Self-pay | Admitting: Nephrology

## 2011-03-31 DIAGNOSIS — N186 End stage renal disease: Secondary | ICD-10-CM

## 2011-04-09 ENCOUNTER — Ambulatory Visit (HOSPITAL_COMMUNITY)
Admission: RE | Admit: 2011-04-09 | Discharge: 2011-04-09 | Disposition: A | Discharge status: Home / Self Care | Payer: Medicare Other | Source: Ambulatory Visit | Attending: Nephrology | Admitting: Nephrology

## 2011-04-09 ENCOUNTER — Other Ambulatory Visit (HOSPITAL_COMMUNITY): Payer: Self-pay | Admitting: Nephrology

## 2011-04-09 DIAGNOSIS — Z992 Dependence on renal dialysis: Secondary | ICD-10-CM | POA: Insufficient documentation

## 2011-04-09 DIAGNOSIS — Z452 Encounter for adjustment and management of vascular access device: Secondary | ICD-10-CM | POA: Insufficient documentation

## 2011-04-09 DIAGNOSIS — N186 End stage renal disease: Secondary | ICD-10-CM | POA: Insufficient documentation

## 2011-07-01 DIAGNOSIS — N2581 Secondary hyperparathyroidism of renal origin: Secondary | ICD-10-CM | POA: Diagnosis not present

## 2011-07-01 DIAGNOSIS — N186 End stage renal disease: Secondary | ICD-10-CM | POA: Diagnosis not present

## 2011-07-08 DIAGNOSIS — I4891 Unspecified atrial fibrillation: Secondary | ICD-10-CM | POA: Diagnosis not present

## 2011-07-08 DIAGNOSIS — Z7901 Long term (current) use of anticoagulants: Secondary | ICD-10-CM | POA: Diagnosis not present

## 2011-07-15 DIAGNOSIS — I4891 Unspecified atrial fibrillation: Secondary | ICD-10-CM | POA: Diagnosis not present

## 2011-07-15 DIAGNOSIS — E876 Hypokalemia: Secondary | ICD-10-CM | POA: Diagnosis not present

## 2011-07-22 DIAGNOSIS — Z7901 Long term (current) use of anticoagulants: Secondary | ICD-10-CM | POA: Diagnosis not present

## 2011-07-22 DIAGNOSIS — I4891 Unspecified atrial fibrillation: Secondary | ICD-10-CM | POA: Diagnosis not present

## 2011-07-30 DIAGNOSIS — N186 End stage renal disease: Secondary | ICD-10-CM | POA: Diagnosis not present

## 2011-07-31 DIAGNOSIS — N186 End stage renal disease: Secondary | ICD-10-CM | POA: Diagnosis not present

## 2011-07-31 DIAGNOSIS — N2581 Secondary hyperparathyroidism of renal origin: Secondary | ICD-10-CM | POA: Diagnosis not present

## 2011-08-03 DIAGNOSIS — N2581 Secondary hyperparathyroidism of renal origin: Secondary | ICD-10-CM | POA: Diagnosis not present

## 2011-08-03 DIAGNOSIS — N186 End stage renal disease: Secondary | ICD-10-CM | POA: Diagnosis not present

## 2011-08-05 DIAGNOSIS — N2581 Secondary hyperparathyroidism of renal origin: Secondary | ICD-10-CM | POA: Diagnosis not present

## 2011-08-05 DIAGNOSIS — N186 End stage renal disease: Secondary | ICD-10-CM | POA: Diagnosis not present

## 2011-08-07 DIAGNOSIS — N186 End stage renal disease: Secondary | ICD-10-CM | POA: Diagnosis not present

## 2011-08-07 DIAGNOSIS — N2581 Secondary hyperparathyroidism of renal origin: Secondary | ICD-10-CM | POA: Diagnosis not present

## 2011-08-10 DIAGNOSIS — N186 End stage renal disease: Secondary | ICD-10-CM | POA: Diagnosis not present

## 2011-08-10 DIAGNOSIS — N2581 Secondary hyperparathyroidism of renal origin: Secondary | ICD-10-CM | POA: Diagnosis not present

## 2011-08-12 DIAGNOSIS — Z7901 Long term (current) use of anticoagulants: Secondary | ICD-10-CM | POA: Diagnosis not present

## 2011-08-12 DIAGNOSIS — N186 End stage renal disease: Secondary | ICD-10-CM | POA: Diagnosis not present

## 2011-08-12 DIAGNOSIS — N2581 Secondary hyperparathyroidism of renal origin: Secondary | ICD-10-CM | POA: Diagnosis not present

## 2011-08-12 DIAGNOSIS — I4891 Unspecified atrial fibrillation: Secondary | ICD-10-CM | POA: Diagnosis not present

## 2011-08-14 DIAGNOSIS — N2581 Secondary hyperparathyroidism of renal origin: Secondary | ICD-10-CM | POA: Diagnosis not present

## 2011-08-14 DIAGNOSIS — N186 End stage renal disease: Secondary | ICD-10-CM | POA: Diagnosis not present

## 2011-08-17 DIAGNOSIS — N186 End stage renal disease: Secondary | ICD-10-CM | POA: Diagnosis not present

## 2011-08-17 DIAGNOSIS — N2581 Secondary hyperparathyroidism of renal origin: Secondary | ICD-10-CM | POA: Diagnosis not present

## 2011-08-19 DIAGNOSIS — N186 End stage renal disease: Secondary | ICD-10-CM | POA: Diagnosis not present

## 2011-08-19 DIAGNOSIS — N2581 Secondary hyperparathyroidism of renal origin: Secondary | ICD-10-CM | POA: Diagnosis not present

## 2011-08-21 DIAGNOSIS — N186 End stage renal disease: Secondary | ICD-10-CM | POA: Diagnosis not present

## 2011-08-21 DIAGNOSIS — N2581 Secondary hyperparathyroidism of renal origin: Secondary | ICD-10-CM | POA: Diagnosis not present

## 2011-08-24 DIAGNOSIS — N186 End stage renal disease: Secondary | ICD-10-CM | POA: Diagnosis not present

## 2011-08-24 DIAGNOSIS — N2581 Secondary hyperparathyroidism of renal origin: Secondary | ICD-10-CM | POA: Diagnosis not present

## 2011-08-26 ENCOUNTER — Encounter: Payer: Self-pay | Admitting: Cardiology

## 2011-08-26 DIAGNOSIS — N186 End stage renal disease: Secondary | ICD-10-CM | POA: Diagnosis not present

## 2011-08-26 DIAGNOSIS — Z7901 Long term (current) use of anticoagulants: Secondary | ICD-10-CM | POA: Diagnosis not present

## 2011-08-26 DIAGNOSIS — I4891 Unspecified atrial fibrillation: Secondary | ICD-10-CM | POA: Diagnosis not present

## 2011-08-26 DIAGNOSIS — N2581 Secondary hyperparathyroidism of renal origin: Secondary | ICD-10-CM | POA: Diagnosis not present

## 2011-08-27 DIAGNOSIS — N186 End stage renal disease: Secondary | ICD-10-CM | POA: Diagnosis not present

## 2011-08-28 DIAGNOSIS — N186 End stage renal disease: Secondary | ICD-10-CM | POA: Diagnosis not present

## 2011-08-28 DIAGNOSIS — D509 Iron deficiency anemia, unspecified: Secondary | ICD-10-CM | POA: Diagnosis not present

## 2011-08-28 DIAGNOSIS — N2581 Secondary hyperparathyroidism of renal origin: Secondary | ICD-10-CM | POA: Diagnosis not present

## 2011-08-31 DIAGNOSIS — D509 Iron deficiency anemia, unspecified: Secondary | ICD-10-CM | POA: Diagnosis not present

## 2011-08-31 DIAGNOSIS — N186 End stage renal disease: Secondary | ICD-10-CM | POA: Diagnosis not present

## 2011-08-31 DIAGNOSIS — N2581 Secondary hyperparathyroidism of renal origin: Secondary | ICD-10-CM | POA: Diagnosis not present

## 2011-09-02 DIAGNOSIS — D509 Iron deficiency anemia, unspecified: Secondary | ICD-10-CM | POA: Diagnosis not present

## 2011-09-02 DIAGNOSIS — N2581 Secondary hyperparathyroidism of renal origin: Secondary | ICD-10-CM | POA: Diagnosis not present

## 2011-09-02 DIAGNOSIS — N186 End stage renal disease: Secondary | ICD-10-CM | POA: Diagnosis not present

## 2011-09-04 DIAGNOSIS — N186 End stage renal disease: Secondary | ICD-10-CM | POA: Diagnosis not present

## 2011-09-04 DIAGNOSIS — N2581 Secondary hyperparathyroidism of renal origin: Secondary | ICD-10-CM | POA: Diagnosis not present

## 2011-09-04 DIAGNOSIS — D509 Iron deficiency anemia, unspecified: Secondary | ICD-10-CM | POA: Diagnosis not present

## 2011-09-07 DIAGNOSIS — N2581 Secondary hyperparathyroidism of renal origin: Secondary | ICD-10-CM | POA: Diagnosis not present

## 2011-09-07 DIAGNOSIS — N186 End stage renal disease: Secondary | ICD-10-CM | POA: Diagnosis not present

## 2011-09-07 DIAGNOSIS — D509 Iron deficiency anemia, unspecified: Secondary | ICD-10-CM | POA: Diagnosis not present

## 2011-09-09 DIAGNOSIS — Z7901 Long term (current) use of anticoagulants: Secondary | ICD-10-CM | POA: Diagnosis not present

## 2011-09-09 DIAGNOSIS — N186 End stage renal disease: Secondary | ICD-10-CM | POA: Diagnosis not present

## 2011-09-09 DIAGNOSIS — I4891 Unspecified atrial fibrillation: Secondary | ICD-10-CM | POA: Diagnosis not present

## 2011-09-09 DIAGNOSIS — N2581 Secondary hyperparathyroidism of renal origin: Secondary | ICD-10-CM | POA: Diagnosis not present

## 2011-09-09 DIAGNOSIS — D509 Iron deficiency anemia, unspecified: Secondary | ICD-10-CM | POA: Diagnosis not present

## 2011-09-11 DIAGNOSIS — N2581 Secondary hyperparathyroidism of renal origin: Secondary | ICD-10-CM | POA: Diagnosis not present

## 2011-09-11 DIAGNOSIS — D509 Iron deficiency anemia, unspecified: Secondary | ICD-10-CM | POA: Diagnosis not present

## 2011-09-11 DIAGNOSIS — N186 End stage renal disease: Secondary | ICD-10-CM | POA: Diagnosis not present

## 2011-09-14 DIAGNOSIS — N2581 Secondary hyperparathyroidism of renal origin: Secondary | ICD-10-CM | POA: Diagnosis not present

## 2011-09-14 DIAGNOSIS — N186 End stage renal disease: Secondary | ICD-10-CM | POA: Diagnosis not present

## 2011-09-14 DIAGNOSIS — D509 Iron deficiency anemia, unspecified: Secondary | ICD-10-CM | POA: Diagnosis not present

## 2011-09-16 DIAGNOSIS — N186 End stage renal disease: Secondary | ICD-10-CM | POA: Diagnosis not present

## 2011-09-16 DIAGNOSIS — D509 Iron deficiency anemia, unspecified: Secondary | ICD-10-CM | POA: Diagnosis not present

## 2011-09-16 DIAGNOSIS — N2581 Secondary hyperparathyroidism of renal origin: Secondary | ICD-10-CM | POA: Diagnosis not present

## 2011-09-18 DIAGNOSIS — N2581 Secondary hyperparathyroidism of renal origin: Secondary | ICD-10-CM | POA: Diagnosis not present

## 2011-09-18 DIAGNOSIS — D509 Iron deficiency anemia, unspecified: Secondary | ICD-10-CM | POA: Diagnosis not present

## 2011-09-18 DIAGNOSIS — N186 End stage renal disease: Secondary | ICD-10-CM | POA: Diagnosis not present

## 2011-09-21 DIAGNOSIS — N186 End stage renal disease: Secondary | ICD-10-CM | POA: Diagnosis not present

## 2011-09-21 DIAGNOSIS — N2581 Secondary hyperparathyroidism of renal origin: Secondary | ICD-10-CM | POA: Diagnosis not present

## 2011-09-21 DIAGNOSIS — D509 Iron deficiency anemia, unspecified: Secondary | ICD-10-CM | POA: Diagnosis not present

## 2011-09-23 DIAGNOSIS — I4891 Unspecified atrial fibrillation: Secondary | ICD-10-CM | POA: Diagnosis not present

## 2011-09-23 DIAGNOSIS — N2581 Secondary hyperparathyroidism of renal origin: Secondary | ICD-10-CM | POA: Diagnosis not present

## 2011-09-23 DIAGNOSIS — Z7901 Long term (current) use of anticoagulants: Secondary | ICD-10-CM | POA: Diagnosis not present

## 2011-09-23 DIAGNOSIS — D509 Iron deficiency anemia, unspecified: Secondary | ICD-10-CM | POA: Diagnosis not present

## 2011-09-23 DIAGNOSIS — N186 End stage renal disease: Secondary | ICD-10-CM | POA: Diagnosis not present

## 2011-09-25 DIAGNOSIS — N186 End stage renal disease: Secondary | ICD-10-CM | POA: Diagnosis not present

## 2011-09-25 DIAGNOSIS — D509 Iron deficiency anemia, unspecified: Secondary | ICD-10-CM | POA: Diagnosis not present

## 2011-09-25 DIAGNOSIS — N2581 Secondary hyperparathyroidism of renal origin: Secondary | ICD-10-CM | POA: Diagnosis not present

## 2011-09-27 DIAGNOSIS — N186 End stage renal disease: Secondary | ICD-10-CM | POA: Diagnosis not present

## 2011-09-28 DIAGNOSIS — N2581 Secondary hyperparathyroidism of renal origin: Secondary | ICD-10-CM | POA: Diagnosis not present

## 2011-09-28 DIAGNOSIS — N186 End stage renal disease: Secondary | ICD-10-CM | POA: Diagnosis not present

## 2011-09-28 DIAGNOSIS — D509 Iron deficiency anemia, unspecified: Secondary | ICD-10-CM | POA: Diagnosis not present

## 2011-10-05 ENCOUNTER — Other Ambulatory Visit (HOSPITAL_COMMUNITY): Payer: Self-pay | Admitting: Nephrology

## 2011-10-05 ENCOUNTER — Encounter (HOSPITAL_COMMUNITY): Payer: Self-pay | Admitting: Pharmacy Technician

## 2011-10-05 DIAGNOSIS — N186 End stage renal disease: Secondary | ICD-10-CM

## 2011-10-06 ENCOUNTER — Ambulatory Visit (HOSPITAL_COMMUNITY)
Admission: RE | Admit: 2011-10-06 | Discharge: 2011-10-06 | Disposition: A | Discharge status: Home / Self Care | Payer: Medicare Other | Source: Ambulatory Visit | Attending: Nephrology | Admitting: Nephrology

## 2011-10-06 ENCOUNTER — Other Ambulatory Visit (HOSPITAL_COMMUNITY): Payer: Self-pay | Admitting: Nephrology

## 2011-10-06 DIAGNOSIS — I120 Hypertensive chronic kidney disease with stage 5 chronic kidney disease or end stage renal disease: Secondary | ICD-10-CM | POA: Insufficient documentation

## 2011-10-06 DIAGNOSIS — E7800 Pure hypercholesterolemia, unspecified: Secondary | ICD-10-CM | POA: Insufficient documentation

## 2011-10-06 DIAGNOSIS — Y849 Medical procedure, unspecified as the cause of abnormal reaction of the patient, or of later complication, without mention of misadventure at the time of the procedure: Secondary | ICD-10-CM | POA: Insufficient documentation

## 2011-10-06 DIAGNOSIS — T82898A Other specified complication of vascular prosthetic devices, implants and grafts, initial encounter: Secondary | ICD-10-CM | POA: Diagnosis not present

## 2011-10-06 DIAGNOSIS — I4891 Unspecified atrial fibrillation: Secondary | ICD-10-CM | POA: Diagnosis not present

## 2011-10-06 DIAGNOSIS — N186 End stage renal disease: Secondary | ICD-10-CM | POA: Insufficient documentation

## 2011-10-06 LAB — POCT I-STAT 4, (NA,K, GLUC, HGB,HCT)
Glucose, Bld: 89 mg/dL (ref 70–99)
HCT: 40 % (ref 36.0–46.0)
Potassium: 5.2 mEq/L — ABNORMAL HIGH (ref 3.5–5.1)

## 2011-10-06 MED ORDER — IOHEXOL 300 MG/ML  SOLN
100.0000 mL | Freq: Once | INTRAMUSCULAR | Status: AC | PRN
  Administered 2011-10-06: 50 mL via INTRAVENOUS

## 2011-10-06 MED ORDER — ALTEPLASE 2 MG IJ SOLR
2.0000 mg | Freq: Once | INTRAMUSCULAR | Status: DC
  Filled 2011-10-06: qty 2

## 2011-10-06 NOTE — Procedures (Signed)
R thigh AVG PTA 7 mm No comp

## 2011-10-06 NOTE — H&P (Signed)
[name] Bender is an 56 y.o. female.   Chief Complaint: poorly functioning/ ? occluded vs narrowed right thigh dialysis graft HPI: Patient with ESRD and stenotic vs occluded right thigh AVGG; she presents today for right thigh shuntogram with possible thrombolysis, angioplasty/stenting of graft or placement of new catheter if needed.  Past Medical History  Diagnosis Date  . Atrial fibrillation   . Hypercholesterolemia   . HTN (hypertension)   . Polycystic kidney disease   . Supraclinoid carotid artery aneurysm, small     Past Surgical History  Procedure Date  . Thrombectomy     of right upper arm ateriovenous graft with revision   . Rivght ovary removal     secondary to a cyst  . C-sections     x2  . Appendectomy   . Total knee arthroplasty feb 2005    right knee  . Right oophorectomy     Family History  Problem Relation Age of Onset  . Stroke Mother   . Atrial fibrillation Father    Social History:  reports that she has never smoked. She does not have any smokeless tobacco history on file. She reports that she does not drink alcohol. Her drug history not on file.  Allergies:  Allergies  Allergen Reactions  . Ancef (Cefazolin Sodium) Itching    Medications Prior to Admission  Medication Sig Dispense Refill  . amiodarone (PACERONE) 200 MG tablet Take 200 mg by mouth daily.        . calcium acetate (PHOSLO) 667 MG capsule Take 667 mg by mouth 3 (three) times daily with meals.        . cinacalcet (SENSIPAR) 30 MG tablet Take 30 mg by mouth daily.        . digoxin (LANOXIN) 0.125 MG tablet Take 125 mcg by mouth 2 (two) times a week. On Tuesday and Friday      . fenofibrate (TRICOR) 145 MG tablet Take 145 mg by mouth daily.        . metoprolol (LOPRESSOR) 100 MG tablet Take 100 mg by mouth daily.        . midodrine (PROAMATINE) 10 MG tablet Take 10 mg by mouth 3 (three) times a week. On Monday,  Wednesday, and  Friday      . sevelamer (RENVELA) 800 MG tablet Take 2,400 mg  by mouth 3 (three) times daily before meals.       . warfarin (COUMADIN) 5 MG tablet Take 2.5-5 mg by mouth daily. Take 1 tablet on Monday and Friday, Then take 0.5 tablet the rest of the week       Medications Prior to Admission  Medication Dose Route Frequency Provider Last Rate Last Dose  . alteplase (CATHFLO ACTIVASE) injection 2 mg  2 mg Intracatheter Once Art A Hoss, MD        No results found for this or any previous visit (from the past 48 hour(s)). No results found.  Review of Systems  Constitutional: Negative for fever and chills.  Respiratory: Positive for cough. Negative for shortness of breath.   Cardiovascular: Negative for chest pain.  Gastrointestinal: Negative for nausea, vomiting and abdominal pain.  Neurological: Negative for headaches.  Endo/Heme/Allergies: Does not bruise/bleed easily.    Blood pressure 98/56, pulse 80, temperature 98.6 F (37 C), temperature source Oral, resp. rate 18. Physical Exam  Constitutional: She is oriented to person, place, and time. She appears well-developed and well-nourished.  Cardiovascular:       irreg irregular; right  thigh AVGG with faint bruit  Respiratory: Effort normal.       Few scattered rhonchi  GI: Soft. Bowel sounds are normal.       Large ventral hernia; obese  Neurological: She is alert and oriented to person, place, and time.     Assessment/Plan Patient with ESRD and stenotic vs occluded right thigh AVGG; plan is for shuntogram with possible thrombolysis, angioplasty/stenting of graft or placement of new HD catheter if necessary. Details/risks of above d/w pt with her understanding and consent.  [name] Bender,D KEVIN 10/06/2011, 11:21 AM

## 2011-10-07 ENCOUNTER — Telehealth (HOSPITAL_COMMUNITY): Payer: Self-pay | Admitting: *Deleted

## 2011-10-07 DIAGNOSIS — I4891 Unspecified atrial fibrillation: Secondary | ICD-10-CM | POA: Diagnosis not present

## 2011-10-07 DIAGNOSIS — Z7901 Long term (current) use of anticoagulants: Secondary | ICD-10-CM | POA: Diagnosis not present

## 2011-10-21 DIAGNOSIS — Z7901 Long term (current) use of anticoagulants: Secondary | ICD-10-CM | POA: Diagnosis not present

## 2011-10-21 DIAGNOSIS — I4891 Unspecified atrial fibrillation: Secondary | ICD-10-CM | POA: Diagnosis not present

## 2011-10-27 DIAGNOSIS — N186 End stage renal disease: Secondary | ICD-10-CM | POA: Diagnosis not present

## 2011-10-28 DIAGNOSIS — N186 End stage renal disease: Secondary | ICD-10-CM | POA: Diagnosis not present

## 2011-10-28 DIAGNOSIS — D509 Iron deficiency anemia, unspecified: Secondary | ICD-10-CM | POA: Diagnosis not present

## 2011-10-28 DIAGNOSIS — N2581 Secondary hyperparathyroidism of renal origin: Secondary | ICD-10-CM | POA: Diagnosis not present

## 2011-11-04 DIAGNOSIS — Z7901 Long term (current) use of anticoagulants: Secondary | ICD-10-CM | POA: Diagnosis not present

## 2011-11-04 DIAGNOSIS — I4891 Unspecified atrial fibrillation: Secondary | ICD-10-CM | POA: Diagnosis not present

## 2011-11-18 DIAGNOSIS — I4891 Unspecified atrial fibrillation: Secondary | ICD-10-CM | POA: Diagnosis not present

## 2011-11-20 DIAGNOSIS — Z7901 Long term (current) use of anticoagulants: Secondary | ICD-10-CM | POA: Diagnosis not present

## 2011-11-20 DIAGNOSIS — I4891 Unspecified atrial fibrillation: Secondary | ICD-10-CM | POA: Diagnosis not present

## 2011-11-27 DIAGNOSIS — N186 End stage renal disease: Secondary | ICD-10-CM | POA: Diagnosis not present

## 2011-11-30 DIAGNOSIS — N2581 Secondary hyperparathyroidism of renal origin: Secondary | ICD-10-CM | POA: Diagnosis not present

## 2011-11-30 DIAGNOSIS — D509 Iron deficiency anemia, unspecified: Secondary | ICD-10-CM | POA: Diagnosis not present

## 2011-11-30 DIAGNOSIS — N186 End stage renal disease: Secondary | ICD-10-CM | POA: Diagnosis not present

## 2011-12-02 DIAGNOSIS — N2581 Secondary hyperparathyroidism of renal origin: Secondary | ICD-10-CM | POA: Diagnosis not present

## 2011-12-02 DIAGNOSIS — N186 End stage renal disease: Secondary | ICD-10-CM | POA: Diagnosis not present

## 2011-12-02 DIAGNOSIS — D509 Iron deficiency anemia, unspecified: Secondary | ICD-10-CM | POA: Diagnosis not present

## 2011-12-04 DIAGNOSIS — D509 Iron deficiency anemia, unspecified: Secondary | ICD-10-CM | POA: Diagnosis not present

## 2011-12-04 DIAGNOSIS — N2581 Secondary hyperparathyroidism of renal origin: Secondary | ICD-10-CM | POA: Diagnosis not present

## 2011-12-04 DIAGNOSIS — N186 End stage renal disease: Secondary | ICD-10-CM | POA: Diagnosis not present

## 2011-12-07 DIAGNOSIS — D509 Iron deficiency anemia, unspecified: Secondary | ICD-10-CM | POA: Diagnosis not present

## 2011-12-07 DIAGNOSIS — N186 End stage renal disease: Secondary | ICD-10-CM | POA: Diagnosis not present

## 2011-12-07 DIAGNOSIS — N2581 Secondary hyperparathyroidism of renal origin: Secondary | ICD-10-CM | POA: Diagnosis not present

## 2011-12-09 DIAGNOSIS — N186 End stage renal disease: Secondary | ICD-10-CM | POA: Diagnosis not present

## 2011-12-09 DIAGNOSIS — N2581 Secondary hyperparathyroidism of renal origin: Secondary | ICD-10-CM | POA: Diagnosis not present

## 2011-12-09 DIAGNOSIS — D509 Iron deficiency anemia, unspecified: Secondary | ICD-10-CM | POA: Diagnosis not present

## 2011-12-09 DIAGNOSIS — Z7901 Long term (current) use of anticoagulants: Secondary | ICD-10-CM | POA: Diagnosis not present

## 2011-12-09 DIAGNOSIS — I4891 Unspecified atrial fibrillation: Secondary | ICD-10-CM | POA: Diagnosis not present

## 2011-12-11 DIAGNOSIS — N186 End stage renal disease: Secondary | ICD-10-CM | POA: Diagnosis not present

## 2011-12-11 DIAGNOSIS — N2581 Secondary hyperparathyroidism of renal origin: Secondary | ICD-10-CM | POA: Diagnosis not present

## 2011-12-11 DIAGNOSIS — D509 Iron deficiency anemia, unspecified: Secondary | ICD-10-CM | POA: Diagnosis not present

## 2011-12-14 DIAGNOSIS — D509 Iron deficiency anemia, unspecified: Secondary | ICD-10-CM | POA: Diagnosis not present

## 2011-12-14 DIAGNOSIS — N186 End stage renal disease: Secondary | ICD-10-CM | POA: Diagnosis not present

## 2011-12-14 DIAGNOSIS — N2581 Secondary hyperparathyroidism of renal origin: Secondary | ICD-10-CM | POA: Diagnosis not present

## 2011-12-16 DIAGNOSIS — N186 End stage renal disease: Secondary | ICD-10-CM | POA: Diagnosis not present

## 2011-12-16 DIAGNOSIS — D509 Iron deficiency anemia, unspecified: Secondary | ICD-10-CM | POA: Diagnosis not present

## 2011-12-16 DIAGNOSIS — N2581 Secondary hyperparathyroidism of renal origin: Secondary | ICD-10-CM | POA: Diagnosis not present

## 2011-12-18 DIAGNOSIS — D509 Iron deficiency anemia, unspecified: Secondary | ICD-10-CM | POA: Diagnosis not present

## 2011-12-18 DIAGNOSIS — N2581 Secondary hyperparathyroidism of renal origin: Secondary | ICD-10-CM | POA: Diagnosis not present

## 2011-12-18 DIAGNOSIS — N186 End stage renal disease: Secondary | ICD-10-CM | POA: Diagnosis not present

## 2011-12-21 DIAGNOSIS — D509 Iron deficiency anemia, unspecified: Secondary | ICD-10-CM | POA: Diagnosis not present

## 2011-12-21 DIAGNOSIS — N2581 Secondary hyperparathyroidism of renal origin: Secondary | ICD-10-CM | POA: Diagnosis not present

## 2011-12-21 DIAGNOSIS — N186 End stage renal disease: Secondary | ICD-10-CM | POA: Diagnosis not present

## 2011-12-23 DIAGNOSIS — N186 End stage renal disease: Secondary | ICD-10-CM | POA: Diagnosis not present

## 2011-12-23 DIAGNOSIS — I4891 Unspecified atrial fibrillation: Secondary | ICD-10-CM | POA: Diagnosis not present

## 2011-12-23 DIAGNOSIS — Z7901 Long term (current) use of anticoagulants: Secondary | ICD-10-CM | POA: Diagnosis not present

## 2011-12-23 DIAGNOSIS — D509 Iron deficiency anemia, unspecified: Secondary | ICD-10-CM | POA: Diagnosis not present

## 2011-12-23 DIAGNOSIS — N2581 Secondary hyperparathyroidism of renal origin: Secondary | ICD-10-CM | POA: Diagnosis not present

## 2011-12-25 DIAGNOSIS — D509 Iron deficiency anemia, unspecified: Secondary | ICD-10-CM | POA: Diagnosis not present

## 2011-12-25 DIAGNOSIS — N186 End stage renal disease: Secondary | ICD-10-CM | POA: Diagnosis not present

## 2011-12-25 DIAGNOSIS — N2581 Secondary hyperparathyroidism of renal origin: Secondary | ICD-10-CM | POA: Diagnosis not present

## 2011-12-27 DIAGNOSIS — N186 End stage renal disease: Secondary | ICD-10-CM | POA: Diagnosis not present

## 2011-12-28 DIAGNOSIS — N2581 Secondary hyperparathyroidism of renal origin: Secondary | ICD-10-CM | POA: Diagnosis not present

## 2011-12-28 DIAGNOSIS — N186 End stage renal disease: Secondary | ICD-10-CM | POA: Diagnosis not present

## 2011-12-28 DIAGNOSIS — D509 Iron deficiency anemia, unspecified: Secondary | ICD-10-CM | POA: Diagnosis not present

## 2011-12-30 DIAGNOSIS — I4891 Unspecified atrial fibrillation: Secondary | ICD-10-CM | POA: Diagnosis not present

## 2012-01-06 DIAGNOSIS — Z7901 Long term (current) use of anticoagulants: Secondary | ICD-10-CM | POA: Diagnosis not present

## 2012-01-06 DIAGNOSIS — I4891 Unspecified atrial fibrillation: Secondary | ICD-10-CM | POA: Diagnosis not present

## 2012-01-10 IMAGING — CT CT ABD-PEL WO/W CM
2 of 8 series · 13 of 46 positions shown, 18 images · IV contrast (APPLIED)
Comparison: None

CLINICAL DATA: evaluate for renal cell carcinoma.

CT ABDOMEN AND PELVIS WITHOUT AND WITH CONTRAST
TECHNIQUE: Multidetector CT imaging of the abdomen and pelvis was
performed following the standard protocol before and following the
bolus administration of intravenous contrast.
Approximately 5-10 ml of intravenous contrast material extravasated
from the IV site.  The patient was assessed by Dr. Jairo E Chiki.
Contrast: 55 ml of omni 300

[Series 5: nephrographic 3.0 b31f st · axial · 0.98mm/px · z∈[-432,-78]mm · 10 of 140 slices shown, 15 images]
[im 11/140  soft-tissue]
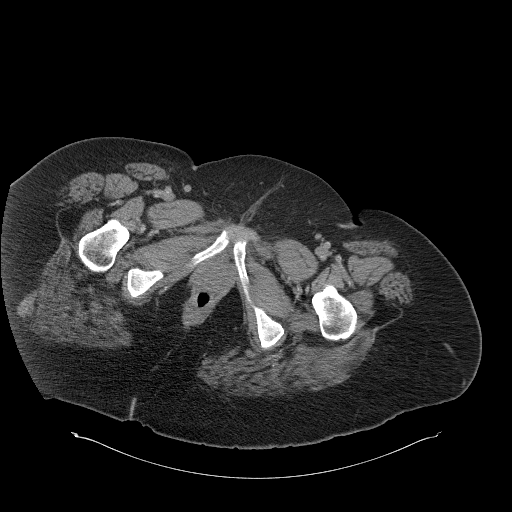
[im 11/140  bone]
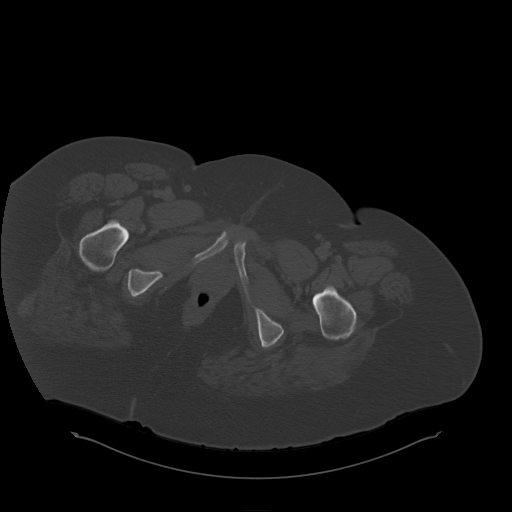
[im 33/140  soft-tissue]
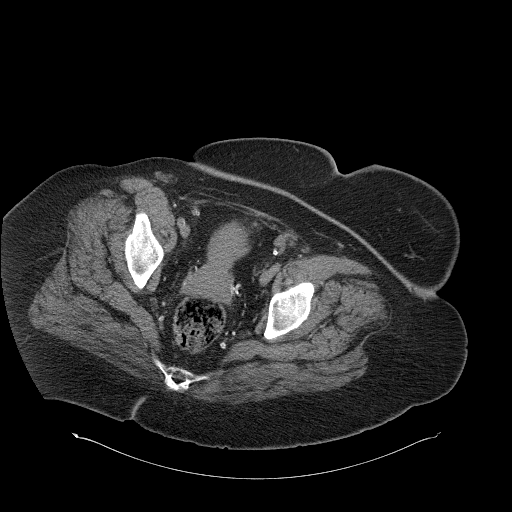
[im 43/140  soft-tissue]
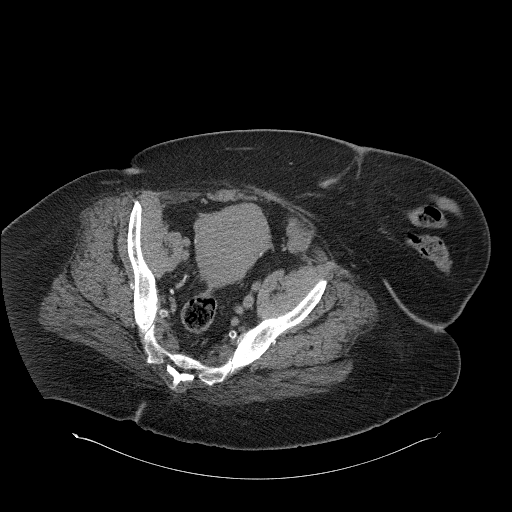
[im 54/140  soft-tissue]
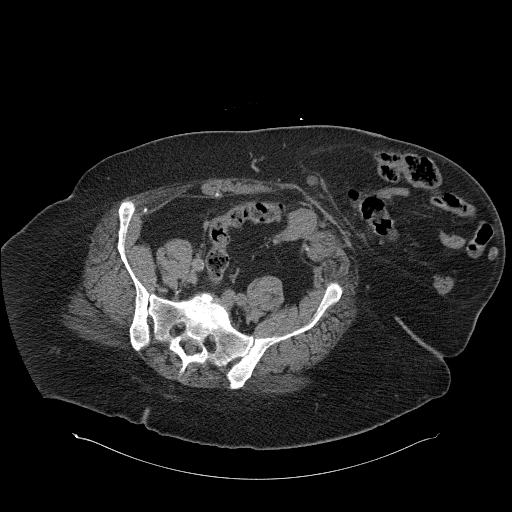
[im 75/140  soft-tissue]
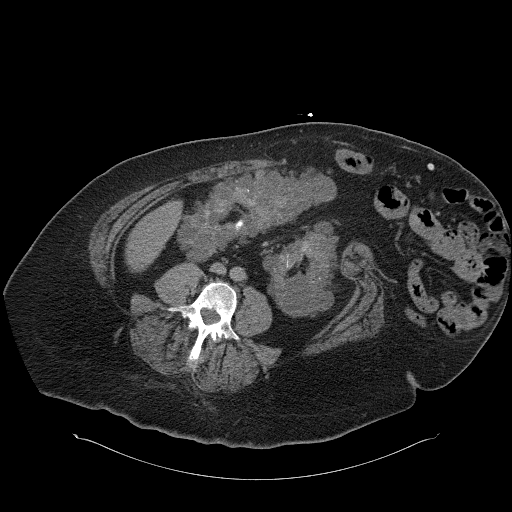
[im 86/140  soft-tissue]
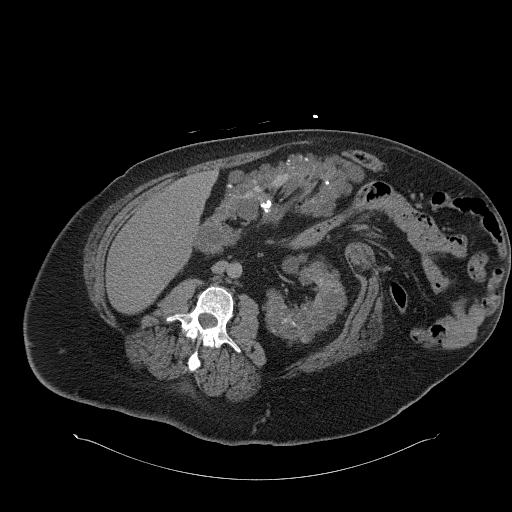
[im 97/140  soft-tissue]
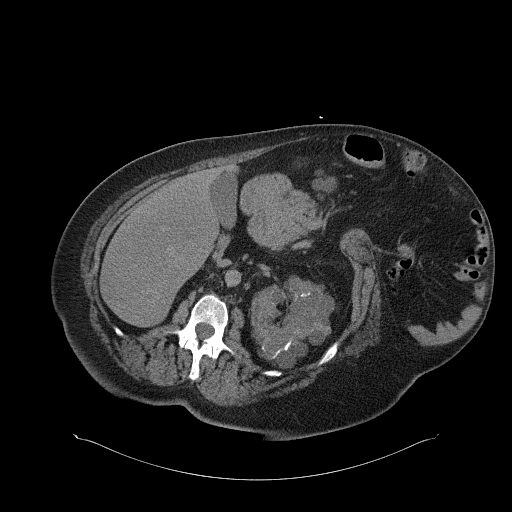
[im 97/140  lung]
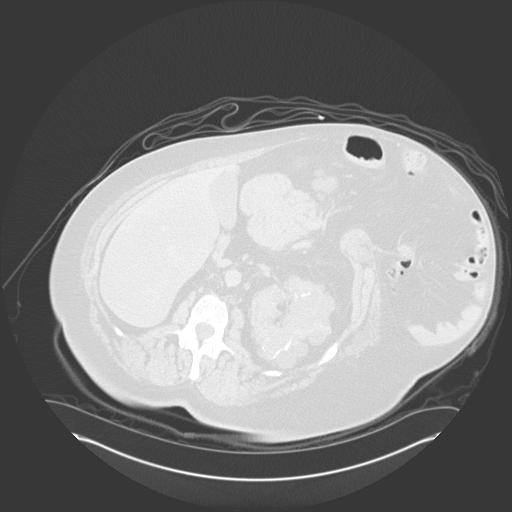
[im 107/140  lung]
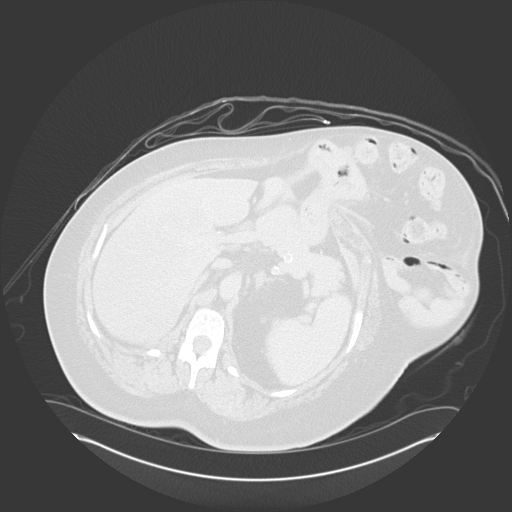
[im 118/140  soft-tissue]
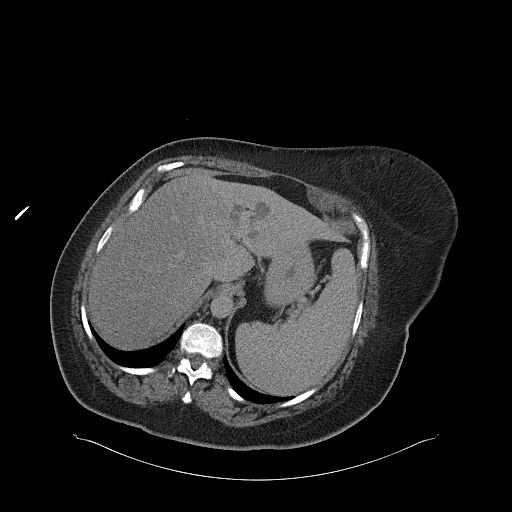
[im 118/140  lung]
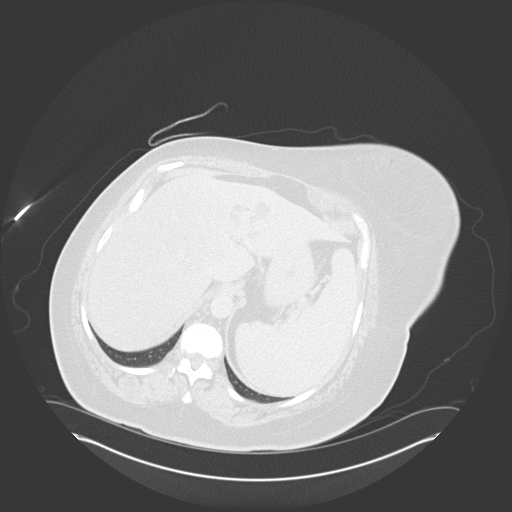
[im 129/140  soft-tissue]
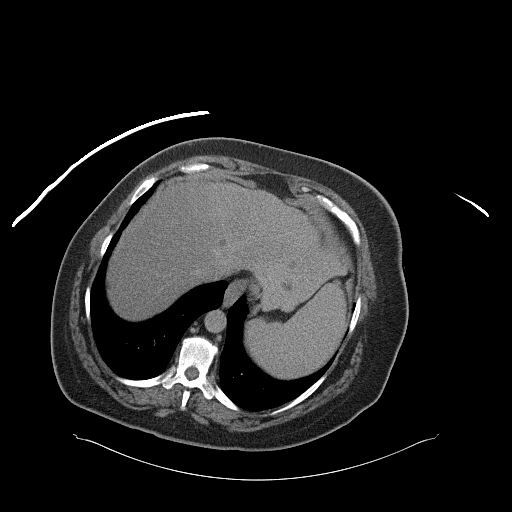
[im 129/140  lung]
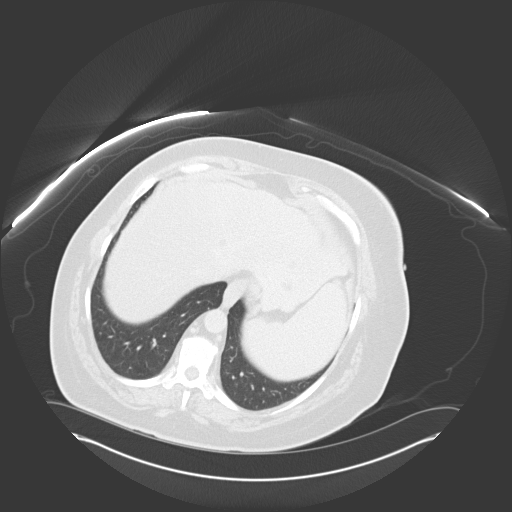
[im 129/140  bone]
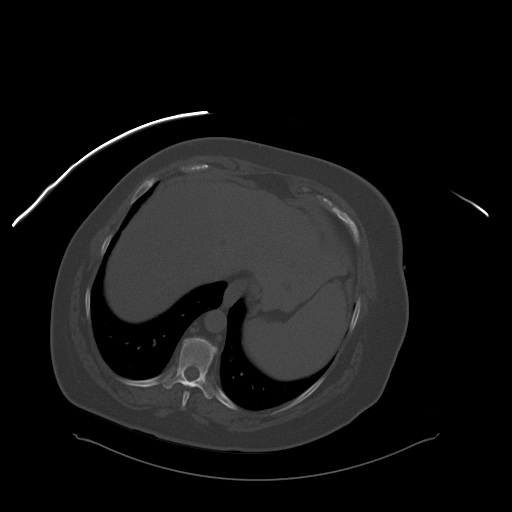

[Series 602: cor a/p venous · coronal · portal-venous · 0.98mm/px · 3 of 153 slices shown]
[im 39/153  soft-tissue]
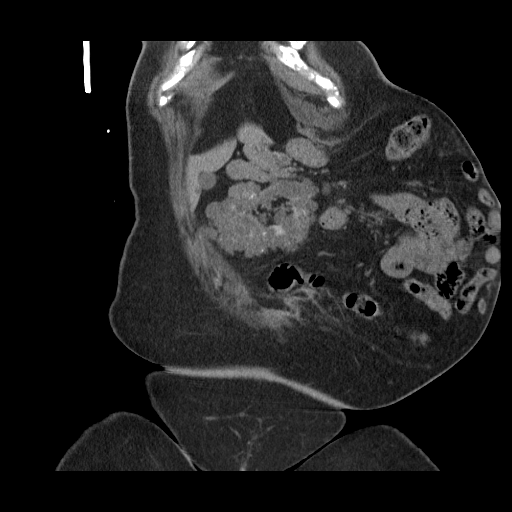
[im 77/153  soft-tissue]
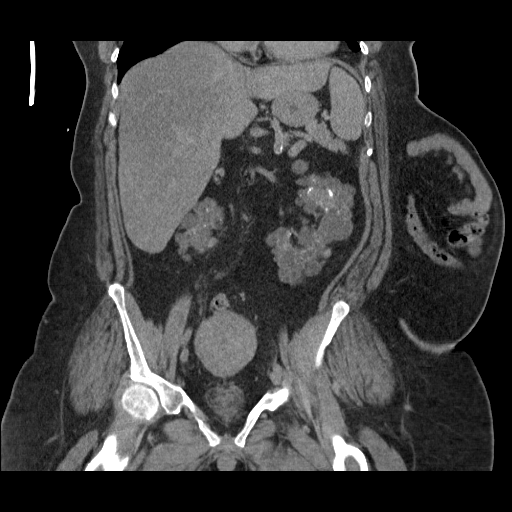
[im 115/153  soft-tissue]
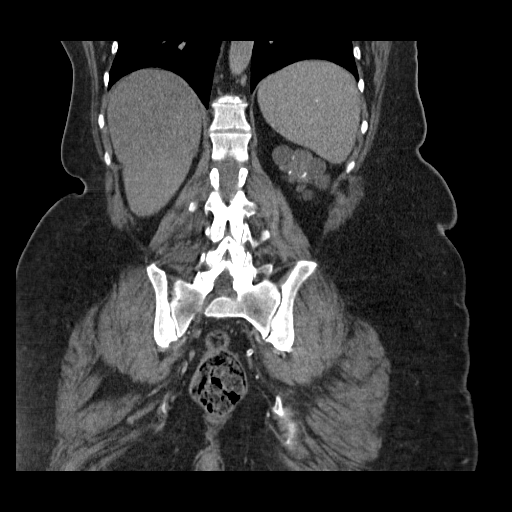

[13 of 46 positions shown; findings below may reference images not displayed]

FINDINGS: The lung bases appear clear.

No pericardial or pleural effusion identified.

The patient has a very large ventral abdominal wall hernia which
contains nonobstructed loops of both small and large bowel as well
as a portion of the stomach and the kidneys.

There is no free fluid within the abdomen or pelvis.

No pathologically enlarged lymph nodes identified within the
abdomen or pelvis.

No fluid collections identified within the abdomen or pelvis.

The spleen appears normal.

The right adrenal gland normal.

The left adrenal gland is normal.

Gallbladder is normal.  The common bile duct is increased in
caliber measuring 8.8 mm.  No obstructing stone or mass noted.  No
significant intrahepatic biliary dilatation.

There is fatty infiltration of the liver.  There are multiple low-
density foci identified within both right and left hepatic lobes.
No abnormal enhancement is associated with these lesions.  Findings
likely represent multiple liver cysts.

Changes of polycystic kidney disease involve both kidneys.  Cysts
are ovarian sinuses and density.  Many of these cysts are hyper
dense or intermediate density.  The arterial phase images are
suboptimal due to extravasation of intravenous contrast material
which occurred during the exam.  No lesions exhibit any have avid
contrast enhancement.

There is no free fluid within the pelvis.

The uterus appears normal.  The left ovary is identified, image
number 87.  The right ovary is not well visualized. The urinary
bladder appears collapsed.

The rectum appears normal.

There is no enlarged pelvic or inguinal lymph nodes.

Review of the visualized osseous structures is unremarkable.
IMPRESSION: 1.  CT findings compatible with polycystic kidney disease.
2.  Renal lesions are very in size and density without obvious
contrast enhancement.  However, note that there was extravasation
of contrast material during the examination limiting the
sensitivity of the arterial and portal venous phase images for
detecting lesion enhancement. I would suggest the patient have
continued follow-up imaging.  At follow-up, a noncontrast MRI of
the kidneys may provide additional information regarding the renal
lesions.
3.  Liver cysts
4.  Fatty infiltration the liver
5.  Large ventral abdominal wall hernia containing nonobstructed
bowel loops.

## 2012-01-20 DIAGNOSIS — I4891 Unspecified atrial fibrillation: Secondary | ICD-10-CM | POA: Diagnosis not present

## 2012-01-20 DIAGNOSIS — Z7901 Long term (current) use of anticoagulants: Secondary | ICD-10-CM | POA: Diagnosis not present

## 2012-01-27 DIAGNOSIS — N186 End stage renal disease: Secondary | ICD-10-CM | POA: Diagnosis not present

## 2012-01-29 DIAGNOSIS — N186 End stage renal disease: Secondary | ICD-10-CM | POA: Diagnosis not present

## 2012-01-29 DIAGNOSIS — D509 Iron deficiency anemia, unspecified: Secondary | ICD-10-CM | POA: Diagnosis not present

## 2012-01-29 DIAGNOSIS — N2581 Secondary hyperparathyroidism of renal origin: Secondary | ICD-10-CM | POA: Diagnosis not present

## 2012-02-10 DIAGNOSIS — I4891 Unspecified atrial fibrillation: Secondary | ICD-10-CM | POA: Diagnosis not present

## 2012-02-10 DIAGNOSIS — Z7901 Long term (current) use of anticoagulants: Secondary | ICD-10-CM | POA: Diagnosis not present

## 2012-02-24 DIAGNOSIS — I4891 Unspecified atrial fibrillation: Secondary | ICD-10-CM | POA: Diagnosis not present

## 2012-02-24 DIAGNOSIS — Z7901 Long term (current) use of anticoagulants: Secondary | ICD-10-CM | POA: Diagnosis not present

## 2012-02-27 DIAGNOSIS — N186 End stage renal disease: Secondary | ICD-10-CM | POA: Diagnosis not present

## 2012-02-29 DIAGNOSIS — N2581 Secondary hyperparathyroidism of renal origin: Secondary | ICD-10-CM | POA: Diagnosis not present

## 2012-02-29 DIAGNOSIS — D509 Iron deficiency anemia, unspecified: Secondary | ICD-10-CM | POA: Diagnosis not present

## 2012-02-29 DIAGNOSIS — Z23 Encounter for immunization: Secondary | ICD-10-CM | POA: Diagnosis not present

## 2012-02-29 DIAGNOSIS — N186 End stage renal disease: Secondary | ICD-10-CM | POA: Diagnosis not present

## 2012-03-02 DIAGNOSIS — D509 Iron deficiency anemia, unspecified: Secondary | ICD-10-CM | POA: Diagnosis not present

## 2012-03-02 DIAGNOSIS — Z23 Encounter for immunization: Secondary | ICD-10-CM | POA: Diagnosis not present

## 2012-03-02 DIAGNOSIS — N2581 Secondary hyperparathyroidism of renal origin: Secondary | ICD-10-CM | POA: Diagnosis not present

## 2012-03-02 DIAGNOSIS — N186 End stage renal disease: Secondary | ICD-10-CM | POA: Diagnosis not present

## 2012-03-04 DIAGNOSIS — N186 End stage renal disease: Secondary | ICD-10-CM | POA: Diagnosis not present

## 2012-03-04 DIAGNOSIS — Z23 Encounter for immunization: Secondary | ICD-10-CM | POA: Diagnosis not present

## 2012-03-04 DIAGNOSIS — D509 Iron deficiency anemia, unspecified: Secondary | ICD-10-CM | POA: Diagnosis not present

## 2012-03-04 DIAGNOSIS — N2581 Secondary hyperparathyroidism of renal origin: Secondary | ICD-10-CM | POA: Diagnosis not present

## 2012-03-07 DIAGNOSIS — Z23 Encounter for immunization: Secondary | ICD-10-CM | POA: Diagnosis not present

## 2012-03-07 DIAGNOSIS — N186 End stage renal disease: Secondary | ICD-10-CM | POA: Diagnosis not present

## 2012-03-07 DIAGNOSIS — N2581 Secondary hyperparathyroidism of renal origin: Secondary | ICD-10-CM | POA: Diagnosis not present

## 2012-03-07 DIAGNOSIS — D509 Iron deficiency anemia, unspecified: Secondary | ICD-10-CM | POA: Diagnosis not present

## 2012-03-09 DIAGNOSIS — N2581 Secondary hyperparathyroidism of renal origin: Secondary | ICD-10-CM | POA: Diagnosis not present

## 2012-03-09 DIAGNOSIS — Z7901 Long term (current) use of anticoagulants: Secondary | ICD-10-CM | POA: Diagnosis not present

## 2012-03-09 DIAGNOSIS — I4891 Unspecified atrial fibrillation: Secondary | ICD-10-CM | POA: Diagnosis not present

## 2012-03-09 DIAGNOSIS — N186 End stage renal disease: Secondary | ICD-10-CM | POA: Diagnosis not present

## 2012-03-09 DIAGNOSIS — D509 Iron deficiency anemia, unspecified: Secondary | ICD-10-CM | POA: Diagnosis not present

## 2012-03-09 DIAGNOSIS — Z23 Encounter for immunization: Secondary | ICD-10-CM | POA: Diagnosis not present

## 2012-03-11 DIAGNOSIS — D509 Iron deficiency anemia, unspecified: Secondary | ICD-10-CM | POA: Diagnosis not present

## 2012-03-11 DIAGNOSIS — N186 End stage renal disease: Secondary | ICD-10-CM | POA: Diagnosis not present

## 2012-03-11 DIAGNOSIS — Z23 Encounter for immunization: Secondary | ICD-10-CM | POA: Diagnosis not present

## 2012-03-11 DIAGNOSIS — N2581 Secondary hyperparathyroidism of renal origin: Secondary | ICD-10-CM | POA: Diagnosis not present

## 2012-03-14 DIAGNOSIS — N186 End stage renal disease: Secondary | ICD-10-CM | POA: Diagnosis not present

## 2012-03-14 DIAGNOSIS — N2581 Secondary hyperparathyroidism of renal origin: Secondary | ICD-10-CM | POA: Diagnosis not present

## 2012-03-14 DIAGNOSIS — D509 Iron deficiency anemia, unspecified: Secondary | ICD-10-CM | POA: Diagnosis not present

## 2012-03-14 DIAGNOSIS — Z23 Encounter for immunization: Secondary | ICD-10-CM | POA: Diagnosis not present

## 2012-03-16 DIAGNOSIS — Z23 Encounter for immunization: Secondary | ICD-10-CM | POA: Diagnosis not present

## 2012-03-16 DIAGNOSIS — N2581 Secondary hyperparathyroidism of renal origin: Secondary | ICD-10-CM | POA: Diagnosis not present

## 2012-03-16 DIAGNOSIS — D509 Iron deficiency anemia, unspecified: Secondary | ICD-10-CM | POA: Diagnosis not present

## 2012-03-16 DIAGNOSIS — N186 End stage renal disease: Secondary | ICD-10-CM | POA: Diagnosis not present

## 2012-03-18 DIAGNOSIS — Z23 Encounter for immunization: Secondary | ICD-10-CM | POA: Diagnosis not present

## 2012-03-18 DIAGNOSIS — N2581 Secondary hyperparathyroidism of renal origin: Secondary | ICD-10-CM | POA: Diagnosis not present

## 2012-03-18 DIAGNOSIS — D509 Iron deficiency anemia, unspecified: Secondary | ICD-10-CM | POA: Diagnosis not present

## 2012-03-18 DIAGNOSIS — N186 End stage renal disease: Secondary | ICD-10-CM | POA: Diagnosis not present

## 2012-03-21 DIAGNOSIS — D509 Iron deficiency anemia, unspecified: Secondary | ICD-10-CM | POA: Diagnosis not present

## 2012-03-21 DIAGNOSIS — N186 End stage renal disease: Secondary | ICD-10-CM | POA: Diagnosis not present

## 2012-03-21 DIAGNOSIS — Z23 Encounter for immunization: Secondary | ICD-10-CM | POA: Diagnosis not present

## 2012-03-21 DIAGNOSIS — N2581 Secondary hyperparathyroidism of renal origin: Secondary | ICD-10-CM | POA: Diagnosis not present

## 2012-03-23 DIAGNOSIS — Z23 Encounter for immunization: Secondary | ICD-10-CM | POA: Diagnosis not present

## 2012-03-23 DIAGNOSIS — N2581 Secondary hyperparathyroidism of renal origin: Secondary | ICD-10-CM | POA: Diagnosis not present

## 2012-03-23 DIAGNOSIS — D509 Iron deficiency anemia, unspecified: Secondary | ICD-10-CM | POA: Diagnosis not present

## 2012-03-23 DIAGNOSIS — I4891 Unspecified atrial fibrillation: Secondary | ICD-10-CM | POA: Diagnosis not present

## 2012-03-23 DIAGNOSIS — N186 End stage renal disease: Secondary | ICD-10-CM | POA: Diagnosis not present

## 2012-03-23 DIAGNOSIS — Z7901 Long term (current) use of anticoagulants: Secondary | ICD-10-CM | POA: Diagnosis not present

## 2012-03-25 DIAGNOSIS — Z23 Encounter for immunization: Secondary | ICD-10-CM | POA: Diagnosis not present

## 2012-03-25 DIAGNOSIS — N2581 Secondary hyperparathyroidism of renal origin: Secondary | ICD-10-CM | POA: Diagnosis not present

## 2012-03-25 DIAGNOSIS — N186 End stage renal disease: Secondary | ICD-10-CM | POA: Diagnosis not present

## 2012-03-25 DIAGNOSIS — D509 Iron deficiency anemia, unspecified: Secondary | ICD-10-CM | POA: Diagnosis not present

## 2012-03-28 DIAGNOSIS — N2581 Secondary hyperparathyroidism of renal origin: Secondary | ICD-10-CM | POA: Diagnosis not present

## 2012-03-28 DIAGNOSIS — D509 Iron deficiency anemia, unspecified: Secondary | ICD-10-CM | POA: Diagnosis not present

## 2012-03-28 DIAGNOSIS — Z23 Encounter for immunization: Secondary | ICD-10-CM | POA: Diagnosis not present

## 2012-03-28 DIAGNOSIS — N186 End stage renal disease: Secondary | ICD-10-CM | POA: Diagnosis not present

## 2012-03-30 DIAGNOSIS — N186 End stage renal disease: Secondary | ICD-10-CM | POA: Diagnosis not present

## 2012-03-30 DIAGNOSIS — D509 Iron deficiency anemia, unspecified: Secondary | ICD-10-CM | POA: Diagnosis not present

## 2012-03-30 DIAGNOSIS — Z992 Dependence on renal dialysis: Secondary | ICD-10-CM | POA: Diagnosis not present

## 2012-03-30 DIAGNOSIS — N2581 Secondary hyperparathyroidism of renal origin: Secondary | ICD-10-CM | POA: Diagnosis not present

## 2012-04-06 DIAGNOSIS — Z7901 Long term (current) use of anticoagulants: Secondary | ICD-10-CM | POA: Diagnosis not present

## 2012-04-06 DIAGNOSIS — I4891 Unspecified atrial fibrillation: Secondary | ICD-10-CM | POA: Diagnosis not present

## 2012-04-13 DIAGNOSIS — I4891 Unspecified atrial fibrillation: Secondary | ICD-10-CM | POA: Diagnosis not present

## 2012-04-13 DIAGNOSIS — Z7901 Long term (current) use of anticoagulants: Secondary | ICD-10-CM | POA: Diagnosis not present

## 2012-04-15 DIAGNOSIS — I4891 Unspecified atrial fibrillation: Secondary | ICD-10-CM | POA: Diagnosis not present

## 2012-04-18 DIAGNOSIS — I4891 Unspecified atrial fibrillation: Secondary | ICD-10-CM | POA: Diagnosis not present

## 2012-04-28 DIAGNOSIS — N186 End stage renal disease: Secondary | ICD-10-CM | POA: Diagnosis not present

## 2012-04-29 DIAGNOSIS — D509 Iron deficiency anemia, unspecified: Secondary | ICD-10-CM | POA: Diagnosis not present

## 2012-04-29 DIAGNOSIS — N186 End stage renal disease: Secondary | ICD-10-CM | POA: Diagnosis not present

## 2012-04-29 DIAGNOSIS — N2581 Secondary hyperparathyroidism of renal origin: Secondary | ICD-10-CM | POA: Diagnosis not present

## 2012-05-02 DIAGNOSIS — N186 End stage renal disease: Secondary | ICD-10-CM | POA: Diagnosis not present

## 2012-05-02 DIAGNOSIS — D509 Iron deficiency anemia, unspecified: Secondary | ICD-10-CM | POA: Diagnosis not present

## 2012-05-02 DIAGNOSIS — N2581 Secondary hyperparathyroidism of renal origin: Secondary | ICD-10-CM | POA: Diagnosis not present

## 2012-05-04 DIAGNOSIS — N186 End stage renal disease: Secondary | ICD-10-CM | POA: Diagnosis not present

## 2012-05-04 DIAGNOSIS — N2581 Secondary hyperparathyroidism of renal origin: Secondary | ICD-10-CM | POA: Diagnosis not present

## 2012-05-04 DIAGNOSIS — D509 Iron deficiency anemia, unspecified: Secondary | ICD-10-CM | POA: Diagnosis not present

## 2012-05-06 DIAGNOSIS — D509 Iron deficiency anemia, unspecified: Secondary | ICD-10-CM | POA: Diagnosis not present

## 2012-05-06 DIAGNOSIS — N186 End stage renal disease: Secondary | ICD-10-CM | POA: Diagnosis not present

## 2012-05-06 DIAGNOSIS — N2581 Secondary hyperparathyroidism of renal origin: Secondary | ICD-10-CM | POA: Diagnosis not present

## 2012-05-09 DIAGNOSIS — D509 Iron deficiency anemia, unspecified: Secondary | ICD-10-CM | POA: Diagnosis not present

## 2012-05-09 DIAGNOSIS — N2581 Secondary hyperparathyroidism of renal origin: Secondary | ICD-10-CM | POA: Diagnosis not present

## 2012-05-09 DIAGNOSIS — N186 End stage renal disease: Secondary | ICD-10-CM | POA: Diagnosis not present

## 2012-05-11 DIAGNOSIS — D509 Iron deficiency anemia, unspecified: Secondary | ICD-10-CM | POA: Diagnosis not present

## 2012-05-11 DIAGNOSIS — N2581 Secondary hyperparathyroidism of renal origin: Secondary | ICD-10-CM | POA: Diagnosis not present

## 2012-05-11 DIAGNOSIS — N186 End stage renal disease: Secondary | ICD-10-CM | POA: Diagnosis not present

## 2012-05-13 DIAGNOSIS — N186 End stage renal disease: Secondary | ICD-10-CM | POA: Diagnosis not present

## 2012-05-13 DIAGNOSIS — D509 Iron deficiency anemia, unspecified: Secondary | ICD-10-CM | POA: Diagnosis not present

## 2012-05-13 DIAGNOSIS — N2581 Secondary hyperparathyroidism of renal origin: Secondary | ICD-10-CM | POA: Diagnosis not present

## 2012-05-16 DIAGNOSIS — N186 End stage renal disease: Secondary | ICD-10-CM | POA: Diagnosis not present

## 2012-05-16 DIAGNOSIS — D509 Iron deficiency anemia, unspecified: Secondary | ICD-10-CM | POA: Diagnosis not present

## 2012-05-16 DIAGNOSIS — N2581 Secondary hyperparathyroidism of renal origin: Secondary | ICD-10-CM | POA: Diagnosis not present

## 2012-05-18 DIAGNOSIS — D509 Iron deficiency anemia, unspecified: Secondary | ICD-10-CM | POA: Diagnosis not present

## 2012-05-18 DIAGNOSIS — N2581 Secondary hyperparathyroidism of renal origin: Secondary | ICD-10-CM | POA: Diagnosis not present

## 2012-05-18 DIAGNOSIS — N186 End stage renal disease: Secondary | ICD-10-CM | POA: Diagnosis not present

## 2012-05-20 DIAGNOSIS — N2581 Secondary hyperparathyroidism of renal origin: Secondary | ICD-10-CM | POA: Diagnosis not present

## 2012-05-20 DIAGNOSIS — D509 Iron deficiency anemia, unspecified: Secondary | ICD-10-CM | POA: Diagnosis not present

## 2012-05-20 DIAGNOSIS — N186 End stage renal disease: Secondary | ICD-10-CM | POA: Diagnosis not present

## 2012-05-23 DIAGNOSIS — N2581 Secondary hyperparathyroidism of renal origin: Secondary | ICD-10-CM | POA: Diagnosis not present

## 2012-05-23 DIAGNOSIS — D509 Iron deficiency anemia, unspecified: Secondary | ICD-10-CM | POA: Diagnosis not present

## 2012-05-23 DIAGNOSIS — N186 End stage renal disease: Secondary | ICD-10-CM | POA: Diagnosis not present

## 2012-05-23 DIAGNOSIS — I4891 Unspecified atrial fibrillation: Secondary | ICD-10-CM | POA: Diagnosis not present

## 2012-05-25 DIAGNOSIS — N2581 Secondary hyperparathyroidism of renal origin: Secondary | ICD-10-CM | POA: Diagnosis not present

## 2012-05-25 DIAGNOSIS — N186 End stage renal disease: Secondary | ICD-10-CM | POA: Diagnosis not present

## 2012-05-25 DIAGNOSIS — D509 Iron deficiency anemia, unspecified: Secondary | ICD-10-CM | POA: Diagnosis not present

## 2012-05-27 DIAGNOSIS — N2581 Secondary hyperparathyroidism of renal origin: Secondary | ICD-10-CM | POA: Diagnosis not present

## 2012-05-27 DIAGNOSIS — D509 Iron deficiency anemia, unspecified: Secondary | ICD-10-CM | POA: Diagnosis not present

## 2012-05-27 DIAGNOSIS — N186 End stage renal disease: Secondary | ICD-10-CM | POA: Diagnosis not present

## 2012-05-28 DIAGNOSIS — N186 End stage renal disease: Secondary | ICD-10-CM | POA: Diagnosis not present

## 2012-05-30 DIAGNOSIS — N186 End stage renal disease: Secondary | ICD-10-CM | POA: Diagnosis not present

## 2012-05-30 DIAGNOSIS — N2581 Secondary hyperparathyroidism of renal origin: Secondary | ICD-10-CM | POA: Diagnosis not present

## 2012-05-30 DIAGNOSIS — D509 Iron deficiency anemia, unspecified: Secondary | ICD-10-CM | POA: Diagnosis not present

## 2012-06-06 IMAGING — CR DG CHEST 2V
2 series · 2 of 2 positions shown · non-contrast
Comparison: 11/22/2008.

CLINICAL DATA: Preop.

CHEST - 2 VIEW

[view not recorded (1 of 2)]
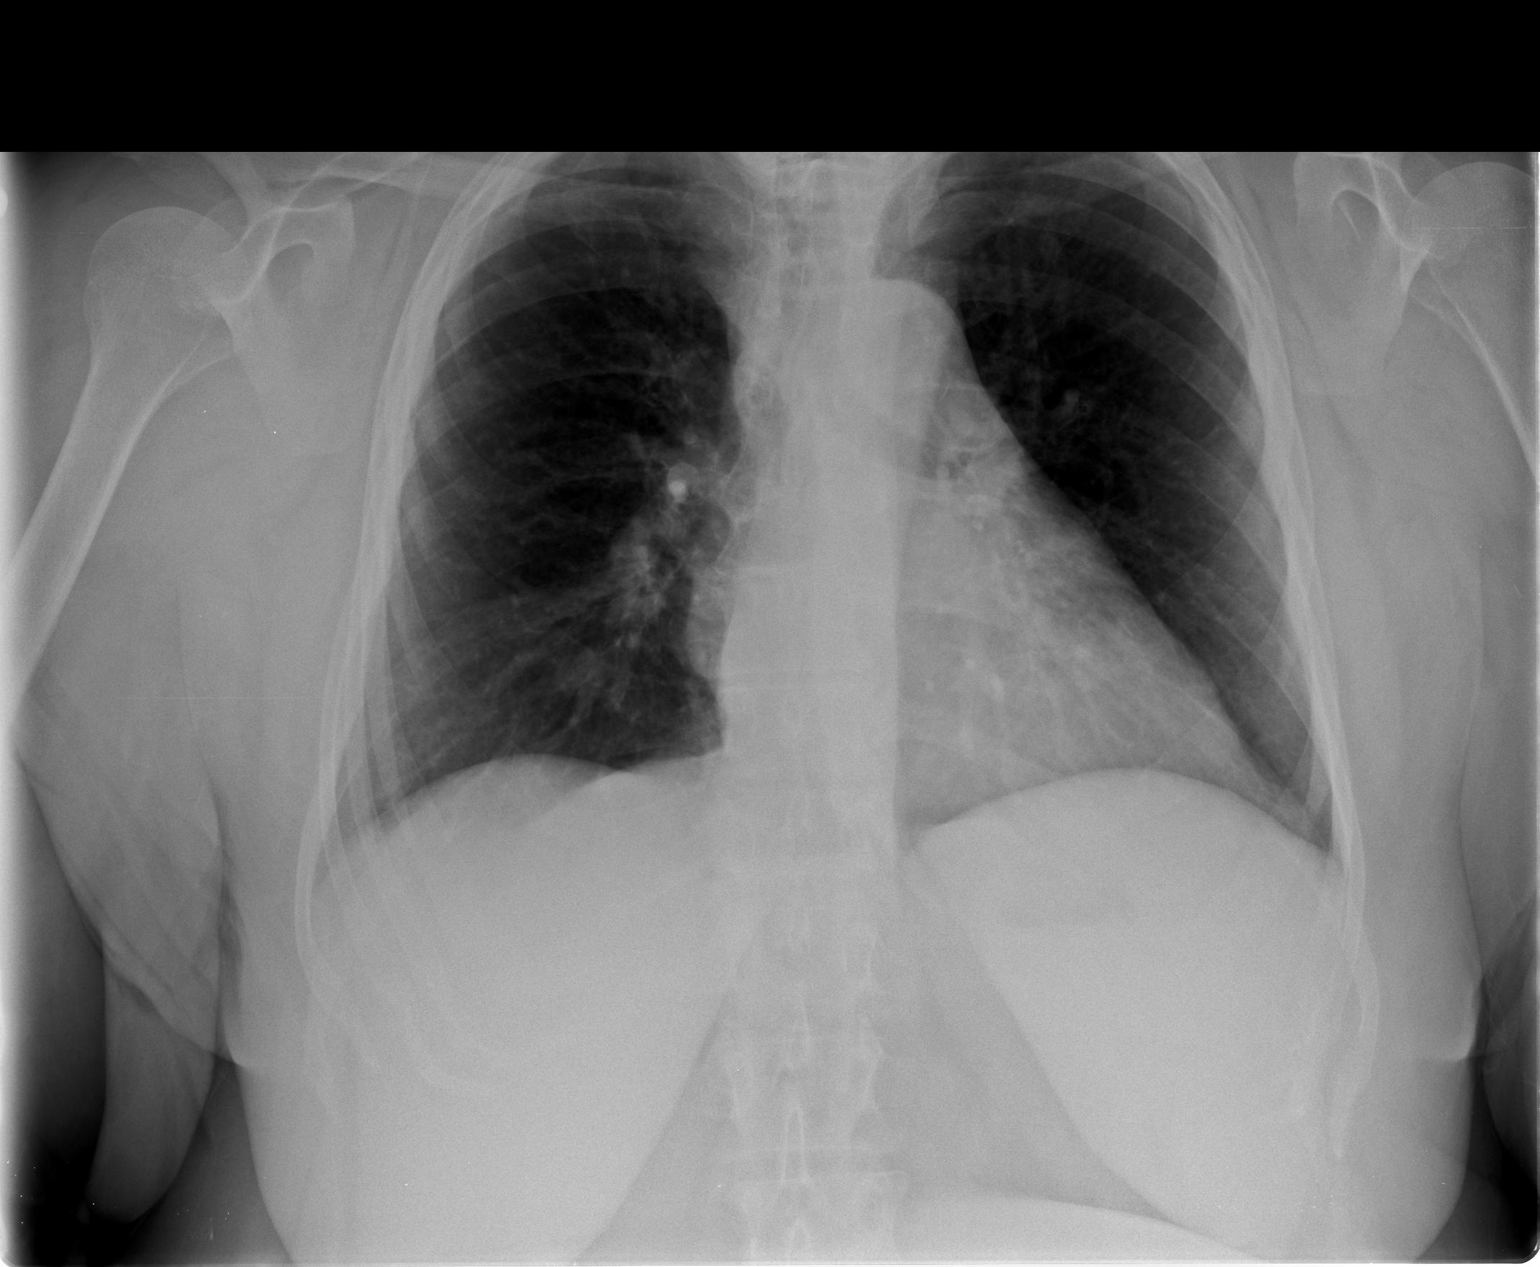

[view not recorded (2 of 2)]
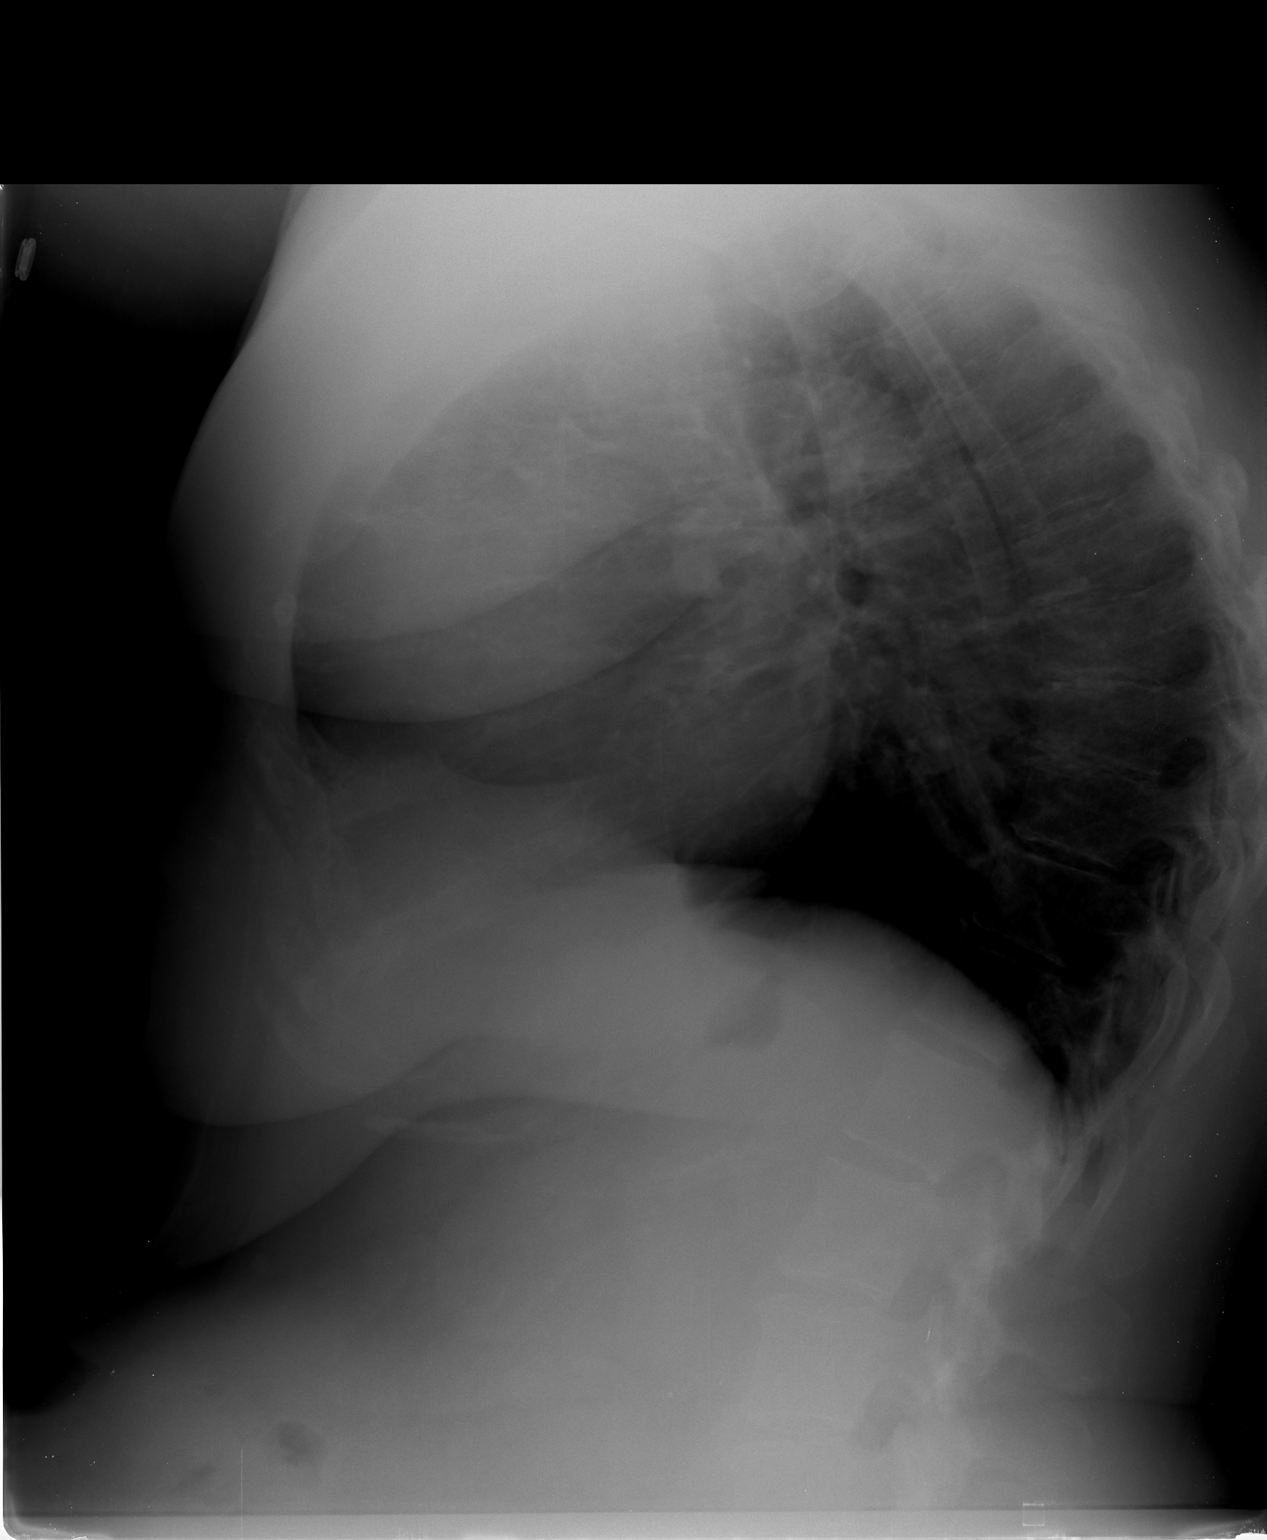

[2 of 2 positions shown; findings below may reference images not displayed]

FINDINGS: Trachea is midline.  Heart size normal.  Lungs are clear.
No pleural fluid.
IMPRESSION: No acute findings.

## 2012-06-19 DIAGNOSIS — I4891 Unspecified atrial fibrillation: Secondary | ICD-10-CM | POA: Diagnosis not present

## 2012-06-28 DIAGNOSIS — N186 End stage renal disease: Secondary | ICD-10-CM | POA: Diagnosis not present

## 2012-07-01 DIAGNOSIS — N186 End stage renal disease: Secondary | ICD-10-CM | POA: Diagnosis not present

## 2012-07-01 DIAGNOSIS — D509 Iron deficiency anemia, unspecified: Secondary | ICD-10-CM | POA: Diagnosis not present

## 2012-07-01 DIAGNOSIS — N2581 Secondary hyperparathyroidism of renal origin: Secondary | ICD-10-CM | POA: Diagnosis not present

## 2012-07-04 DIAGNOSIS — N2581 Secondary hyperparathyroidism of renal origin: Secondary | ICD-10-CM | POA: Diagnosis not present

## 2012-07-04 DIAGNOSIS — D509 Iron deficiency anemia, unspecified: Secondary | ICD-10-CM | POA: Diagnosis not present

## 2012-07-04 DIAGNOSIS — N186 End stage renal disease: Secondary | ICD-10-CM | POA: Diagnosis not present

## 2012-07-06 DIAGNOSIS — D509 Iron deficiency anemia, unspecified: Secondary | ICD-10-CM | POA: Diagnosis not present

## 2012-07-06 DIAGNOSIS — N2581 Secondary hyperparathyroidism of renal origin: Secondary | ICD-10-CM | POA: Diagnosis not present

## 2012-07-06 DIAGNOSIS — N186 End stage renal disease: Secondary | ICD-10-CM | POA: Diagnosis not present

## 2012-07-08 DIAGNOSIS — N186 End stage renal disease: Secondary | ICD-10-CM | POA: Diagnosis not present

## 2012-07-08 DIAGNOSIS — N2581 Secondary hyperparathyroidism of renal origin: Secondary | ICD-10-CM | POA: Diagnosis not present

## 2012-07-08 DIAGNOSIS — D509 Iron deficiency anemia, unspecified: Secondary | ICD-10-CM | POA: Diagnosis not present

## 2012-07-11 DIAGNOSIS — D509 Iron deficiency anemia, unspecified: Secondary | ICD-10-CM | POA: Diagnosis not present

## 2012-07-11 DIAGNOSIS — N2581 Secondary hyperparathyroidism of renal origin: Secondary | ICD-10-CM | POA: Diagnosis not present

## 2012-07-11 DIAGNOSIS — N186 End stage renal disease: Secondary | ICD-10-CM | POA: Diagnosis not present

## 2012-07-13 DIAGNOSIS — N186 End stage renal disease: Secondary | ICD-10-CM | POA: Diagnosis not present

## 2012-07-13 DIAGNOSIS — D509 Iron deficiency anemia, unspecified: Secondary | ICD-10-CM | POA: Diagnosis not present

## 2012-07-13 DIAGNOSIS — N2581 Secondary hyperparathyroidism of renal origin: Secondary | ICD-10-CM | POA: Diagnosis not present

## 2012-07-15 DIAGNOSIS — N186 End stage renal disease: Secondary | ICD-10-CM | POA: Diagnosis not present

## 2012-07-15 DIAGNOSIS — N2581 Secondary hyperparathyroidism of renal origin: Secondary | ICD-10-CM | POA: Diagnosis not present

## 2012-07-15 DIAGNOSIS — D509 Iron deficiency anemia, unspecified: Secondary | ICD-10-CM | POA: Diagnosis not present

## 2012-07-18 DIAGNOSIS — D509 Iron deficiency anemia, unspecified: Secondary | ICD-10-CM | POA: Diagnosis not present

## 2012-07-18 DIAGNOSIS — N2581 Secondary hyperparathyroidism of renal origin: Secondary | ICD-10-CM | POA: Diagnosis not present

## 2012-07-18 DIAGNOSIS — N186 End stage renal disease: Secondary | ICD-10-CM | POA: Diagnosis not present

## 2012-07-22 DIAGNOSIS — N2581 Secondary hyperparathyroidism of renal origin: Secondary | ICD-10-CM | POA: Diagnosis not present

## 2012-07-22 DIAGNOSIS — N186 End stage renal disease: Secondary | ICD-10-CM | POA: Diagnosis not present

## 2012-07-22 DIAGNOSIS — I4891 Unspecified atrial fibrillation: Secondary | ICD-10-CM | POA: Diagnosis not present

## 2012-07-22 DIAGNOSIS — D509 Iron deficiency anemia, unspecified: Secondary | ICD-10-CM | POA: Diagnosis not present

## 2012-07-25 DIAGNOSIS — N186 End stage renal disease: Secondary | ICD-10-CM | POA: Diagnosis not present

## 2012-07-25 DIAGNOSIS — N2581 Secondary hyperparathyroidism of renal origin: Secondary | ICD-10-CM | POA: Diagnosis not present

## 2012-07-25 DIAGNOSIS — D509 Iron deficiency anemia, unspecified: Secondary | ICD-10-CM | POA: Diagnosis not present

## 2012-07-29 DIAGNOSIS — D509 Iron deficiency anemia, unspecified: Secondary | ICD-10-CM | POA: Diagnosis not present

## 2012-07-29 DIAGNOSIS — N186 End stage renal disease: Secondary | ICD-10-CM | POA: Diagnosis not present

## 2012-07-29 DIAGNOSIS — N2581 Secondary hyperparathyroidism of renal origin: Secondary | ICD-10-CM | POA: Diagnosis not present

## 2012-08-01 DIAGNOSIS — D509 Iron deficiency anemia, unspecified: Secondary | ICD-10-CM | POA: Diagnosis not present

## 2012-08-01 DIAGNOSIS — N2581 Secondary hyperparathyroidism of renal origin: Secondary | ICD-10-CM | POA: Diagnosis not present

## 2012-08-01 DIAGNOSIS — N186 End stage renal disease: Secondary | ICD-10-CM | POA: Diagnosis not present

## 2012-08-24 DIAGNOSIS — I4891 Unspecified atrial fibrillation: Secondary | ICD-10-CM | POA: Diagnosis not present

## 2012-08-26 DIAGNOSIS — N186 End stage renal disease: Secondary | ICD-10-CM | POA: Diagnosis not present

## 2012-08-29 DIAGNOSIS — D509 Iron deficiency anemia, unspecified: Secondary | ICD-10-CM | POA: Diagnosis not present

## 2012-08-29 DIAGNOSIS — N2581 Secondary hyperparathyroidism of renal origin: Secondary | ICD-10-CM | POA: Diagnosis not present

## 2012-08-29 DIAGNOSIS — N186 End stage renal disease: Secondary | ICD-10-CM | POA: Diagnosis not present

## 2012-09-14 DIAGNOSIS — I4891 Unspecified atrial fibrillation: Secondary | ICD-10-CM | POA: Diagnosis not present

## 2012-09-21 DIAGNOSIS — I4891 Unspecified atrial fibrillation: Secondary | ICD-10-CM | POA: Diagnosis not present

## 2012-09-23 DIAGNOSIS — I4891 Unspecified atrial fibrillation: Secondary | ICD-10-CM | POA: Diagnosis not present

## 2012-09-26 DIAGNOSIS — N186 End stage renal disease: Secondary | ICD-10-CM | POA: Diagnosis not present

## 2012-09-28 DIAGNOSIS — N186 End stage renal disease: Secondary | ICD-10-CM | POA: Diagnosis not present

## 2012-09-28 DIAGNOSIS — I4891 Unspecified atrial fibrillation: Secondary | ICD-10-CM | POA: Diagnosis not present

## 2012-09-28 DIAGNOSIS — N2581 Secondary hyperparathyroidism of renal origin: Secondary | ICD-10-CM | POA: Diagnosis not present

## 2012-10-05 DIAGNOSIS — I4891 Unspecified atrial fibrillation: Secondary | ICD-10-CM | POA: Diagnosis not present

## 2012-10-19 DIAGNOSIS — I4891 Unspecified atrial fibrillation: Secondary | ICD-10-CM | POA: Diagnosis not present

## 2012-10-26 DIAGNOSIS — N186 End stage renal disease: Secondary | ICD-10-CM | POA: Diagnosis not present

## 2012-10-28 DIAGNOSIS — N186 End stage renal disease: Secondary | ICD-10-CM | POA: Diagnosis not present

## 2012-10-28 DIAGNOSIS — N2581 Secondary hyperparathyroidism of renal origin: Secondary | ICD-10-CM | POA: Diagnosis not present

## 2012-11-23 DIAGNOSIS — I4891 Unspecified atrial fibrillation: Secondary | ICD-10-CM | POA: Diagnosis not present

## 2012-11-26 DIAGNOSIS — N186 End stage renal disease: Secondary | ICD-10-CM | POA: Diagnosis not present

## 2012-11-28 DIAGNOSIS — N2581 Secondary hyperparathyroidism of renal origin: Secondary | ICD-10-CM | POA: Diagnosis not present

## 2012-11-28 DIAGNOSIS — N186 End stage renal disease: Secondary | ICD-10-CM | POA: Diagnosis not present

## 2012-11-30 DIAGNOSIS — N186 End stage renal disease: Secondary | ICD-10-CM | POA: Diagnosis not present

## 2012-11-30 DIAGNOSIS — N2581 Secondary hyperparathyroidism of renal origin: Secondary | ICD-10-CM | POA: Diagnosis not present

## 2012-12-02 DIAGNOSIS — N186 End stage renal disease: Secondary | ICD-10-CM | POA: Diagnosis not present

## 2012-12-02 DIAGNOSIS — N2581 Secondary hyperparathyroidism of renal origin: Secondary | ICD-10-CM | POA: Diagnosis not present

## 2012-12-05 DIAGNOSIS — N2581 Secondary hyperparathyroidism of renal origin: Secondary | ICD-10-CM | POA: Diagnosis not present

## 2012-12-05 DIAGNOSIS — N186 End stage renal disease: Secondary | ICD-10-CM | POA: Diagnosis not present

## 2012-12-07 DIAGNOSIS — N2581 Secondary hyperparathyroidism of renal origin: Secondary | ICD-10-CM | POA: Diagnosis not present

## 2012-12-07 DIAGNOSIS — N186 End stage renal disease: Secondary | ICD-10-CM | POA: Diagnosis not present

## 2012-12-09 DIAGNOSIS — N186 End stage renal disease: Secondary | ICD-10-CM | POA: Diagnosis not present

## 2012-12-09 DIAGNOSIS — N2581 Secondary hyperparathyroidism of renal origin: Secondary | ICD-10-CM | POA: Diagnosis not present

## 2012-12-12 DIAGNOSIS — N2581 Secondary hyperparathyroidism of renal origin: Secondary | ICD-10-CM | POA: Diagnosis not present

## 2012-12-12 DIAGNOSIS — N186 End stage renal disease: Secondary | ICD-10-CM | POA: Diagnosis not present

## 2012-12-14 DIAGNOSIS — N2581 Secondary hyperparathyroidism of renal origin: Secondary | ICD-10-CM | POA: Diagnosis not present

## 2012-12-14 DIAGNOSIS — N186 End stage renal disease: Secondary | ICD-10-CM | POA: Diagnosis not present

## 2012-12-16 DIAGNOSIS — N2581 Secondary hyperparathyroidism of renal origin: Secondary | ICD-10-CM | POA: Diagnosis not present

## 2012-12-16 DIAGNOSIS — N186 End stage renal disease: Secondary | ICD-10-CM | POA: Diagnosis not present

## 2012-12-19 DIAGNOSIS — N186 End stage renal disease: Secondary | ICD-10-CM | POA: Diagnosis not present

## 2012-12-19 DIAGNOSIS — N2581 Secondary hyperparathyroidism of renal origin: Secondary | ICD-10-CM | POA: Diagnosis not present

## 2012-12-21 DIAGNOSIS — N2581 Secondary hyperparathyroidism of renal origin: Secondary | ICD-10-CM | POA: Diagnosis not present

## 2012-12-21 DIAGNOSIS — N186 End stage renal disease: Secondary | ICD-10-CM | POA: Diagnosis not present

## 2012-12-21 DIAGNOSIS — I4891 Unspecified atrial fibrillation: Secondary | ICD-10-CM | POA: Diagnosis not present

## 2012-12-23 DIAGNOSIS — N186 End stage renal disease: Secondary | ICD-10-CM | POA: Diagnosis not present

## 2012-12-23 DIAGNOSIS — N2581 Secondary hyperparathyroidism of renal origin: Secondary | ICD-10-CM | POA: Diagnosis not present

## 2012-12-26 DIAGNOSIS — N2581 Secondary hyperparathyroidism of renal origin: Secondary | ICD-10-CM | POA: Diagnosis not present

## 2012-12-26 DIAGNOSIS — N186 End stage renal disease: Secondary | ICD-10-CM | POA: Diagnosis not present

## 2012-12-28 DIAGNOSIS — N2581 Secondary hyperparathyroidism of renal origin: Secondary | ICD-10-CM | POA: Diagnosis not present

## 2012-12-28 DIAGNOSIS — N186 End stage renal disease: Secondary | ICD-10-CM | POA: Diagnosis not present

## 2013-01-04 DIAGNOSIS — I4891 Unspecified atrial fibrillation: Secondary | ICD-10-CM | POA: Diagnosis not present

## 2013-01-18 DIAGNOSIS — I4891 Unspecified atrial fibrillation: Secondary | ICD-10-CM | POA: Diagnosis not present

## 2013-01-26 DIAGNOSIS — N186 End stage renal disease: Secondary | ICD-10-CM | POA: Diagnosis not present

## 2013-01-27 DIAGNOSIS — N186 End stage renal disease: Secondary | ICD-10-CM | POA: Diagnosis not present

## 2013-01-27 DIAGNOSIS — N2581 Secondary hyperparathyroidism of renal origin: Secondary | ICD-10-CM | POA: Diagnosis not present

## 2013-01-30 DIAGNOSIS — N186 End stage renal disease: Secondary | ICD-10-CM | POA: Diagnosis not present

## 2013-01-30 DIAGNOSIS — N2581 Secondary hyperparathyroidism of renal origin: Secondary | ICD-10-CM | POA: Diagnosis not present

## 2013-02-01 DIAGNOSIS — N186 End stage renal disease: Secondary | ICD-10-CM | POA: Diagnosis not present

## 2013-02-01 DIAGNOSIS — N2581 Secondary hyperparathyroidism of renal origin: Secondary | ICD-10-CM | POA: Diagnosis not present

## 2013-02-03 DIAGNOSIS — N2581 Secondary hyperparathyroidism of renal origin: Secondary | ICD-10-CM | POA: Diagnosis not present

## 2013-02-03 DIAGNOSIS — N186 End stage renal disease: Secondary | ICD-10-CM | POA: Diagnosis not present

## 2013-02-06 DIAGNOSIS — N2581 Secondary hyperparathyroidism of renal origin: Secondary | ICD-10-CM | POA: Diagnosis not present

## 2013-02-06 DIAGNOSIS — N186 End stage renal disease: Secondary | ICD-10-CM | POA: Diagnosis not present

## 2013-02-08 DIAGNOSIS — N186 End stage renal disease: Secondary | ICD-10-CM | POA: Diagnosis not present

## 2013-02-08 DIAGNOSIS — N2581 Secondary hyperparathyroidism of renal origin: Secondary | ICD-10-CM | POA: Diagnosis not present

## 2013-02-10 DIAGNOSIS — N186 End stage renal disease: Secondary | ICD-10-CM | POA: Diagnosis not present

## 2013-02-10 DIAGNOSIS — N2581 Secondary hyperparathyroidism of renal origin: Secondary | ICD-10-CM | POA: Diagnosis not present

## 2013-02-13 DIAGNOSIS — N2581 Secondary hyperparathyroidism of renal origin: Secondary | ICD-10-CM | POA: Diagnosis not present

## 2013-02-13 DIAGNOSIS — N186 End stage renal disease: Secondary | ICD-10-CM | POA: Diagnosis not present

## 2013-02-15 DIAGNOSIS — N2581 Secondary hyperparathyroidism of renal origin: Secondary | ICD-10-CM | POA: Diagnosis not present

## 2013-02-15 DIAGNOSIS — N186 End stage renal disease: Secondary | ICD-10-CM | POA: Diagnosis not present

## 2013-02-17 DIAGNOSIS — N2581 Secondary hyperparathyroidism of renal origin: Secondary | ICD-10-CM | POA: Diagnosis not present

## 2013-02-17 DIAGNOSIS — N186 End stage renal disease: Secondary | ICD-10-CM | POA: Diagnosis not present

## 2013-02-20 DIAGNOSIS — N2581 Secondary hyperparathyroidism of renal origin: Secondary | ICD-10-CM | POA: Diagnosis not present

## 2013-02-20 DIAGNOSIS — N186 End stage renal disease: Secondary | ICD-10-CM | POA: Diagnosis not present

## 2013-02-22 DIAGNOSIS — N2581 Secondary hyperparathyroidism of renal origin: Secondary | ICD-10-CM | POA: Diagnosis not present

## 2013-02-22 DIAGNOSIS — I4891 Unspecified atrial fibrillation: Secondary | ICD-10-CM | POA: Diagnosis not present

## 2013-02-22 DIAGNOSIS — N186 End stage renal disease: Secondary | ICD-10-CM | POA: Diagnosis not present

## 2013-02-24 DIAGNOSIS — N186 End stage renal disease: Secondary | ICD-10-CM | POA: Diagnosis not present

## 2013-02-24 DIAGNOSIS — N2581 Secondary hyperparathyroidism of renal origin: Secondary | ICD-10-CM | POA: Diagnosis not present

## 2013-02-26 DIAGNOSIS — N186 End stage renal disease: Secondary | ICD-10-CM | POA: Diagnosis not present

## 2013-02-27 DIAGNOSIS — N186 End stage renal disease: Secondary | ICD-10-CM | POA: Diagnosis not present

## 2013-02-27 DIAGNOSIS — N2581 Secondary hyperparathyroidism of renal origin: Secondary | ICD-10-CM | POA: Diagnosis not present

## 2013-03-01 DIAGNOSIS — N186 End stage renal disease: Secondary | ICD-10-CM | POA: Diagnosis not present

## 2013-03-01 DIAGNOSIS — N2581 Secondary hyperparathyroidism of renal origin: Secondary | ICD-10-CM | POA: Diagnosis not present

## 2013-03-03 DIAGNOSIS — N2581 Secondary hyperparathyroidism of renal origin: Secondary | ICD-10-CM | POA: Diagnosis not present

## 2013-03-03 DIAGNOSIS — N186 End stage renal disease: Secondary | ICD-10-CM | POA: Diagnosis not present

## 2013-03-06 DIAGNOSIS — N186 End stage renal disease: Secondary | ICD-10-CM | POA: Diagnosis not present

## 2013-03-06 DIAGNOSIS — N2581 Secondary hyperparathyroidism of renal origin: Secondary | ICD-10-CM | POA: Diagnosis not present

## 2013-03-08 DIAGNOSIS — N186 End stage renal disease: Secondary | ICD-10-CM | POA: Diagnosis not present

## 2013-03-08 DIAGNOSIS — N2581 Secondary hyperparathyroidism of renal origin: Secondary | ICD-10-CM | POA: Diagnosis not present

## 2013-03-10 DIAGNOSIS — N2581 Secondary hyperparathyroidism of renal origin: Secondary | ICD-10-CM | POA: Diagnosis not present

## 2013-03-10 DIAGNOSIS — N186 End stage renal disease: Secondary | ICD-10-CM | POA: Diagnosis not present

## 2013-03-13 DIAGNOSIS — N2581 Secondary hyperparathyroidism of renal origin: Secondary | ICD-10-CM | POA: Diagnosis not present

## 2013-03-13 DIAGNOSIS — N186 End stage renal disease: Secondary | ICD-10-CM | POA: Diagnosis not present

## 2013-03-15 DIAGNOSIS — N186 End stage renal disease: Secondary | ICD-10-CM | POA: Diagnosis not present

## 2013-03-15 DIAGNOSIS — N2581 Secondary hyperparathyroidism of renal origin: Secondary | ICD-10-CM | POA: Diagnosis not present

## 2013-03-17 DIAGNOSIS — N186 End stage renal disease: Secondary | ICD-10-CM | POA: Diagnosis not present

## 2013-03-17 DIAGNOSIS — N2581 Secondary hyperparathyroidism of renal origin: Secondary | ICD-10-CM | POA: Diagnosis not present

## 2013-03-20 DIAGNOSIS — N186 End stage renal disease: Secondary | ICD-10-CM | POA: Diagnosis not present

## 2013-03-20 DIAGNOSIS — N2581 Secondary hyperparathyroidism of renal origin: Secondary | ICD-10-CM | POA: Diagnosis not present

## 2013-03-22 DIAGNOSIS — I4891 Unspecified atrial fibrillation: Secondary | ICD-10-CM | POA: Diagnosis not present

## 2013-03-22 DIAGNOSIS — N2581 Secondary hyperparathyroidism of renal origin: Secondary | ICD-10-CM | POA: Diagnosis not present

## 2013-03-22 DIAGNOSIS — N186 End stage renal disease: Secondary | ICD-10-CM | POA: Diagnosis not present

## 2013-03-24 DIAGNOSIS — N186 End stage renal disease: Secondary | ICD-10-CM | POA: Diagnosis not present

## 2013-03-24 DIAGNOSIS — N2581 Secondary hyperparathyroidism of renal origin: Secondary | ICD-10-CM | POA: Diagnosis not present

## 2013-03-27 DIAGNOSIS — N186 End stage renal disease: Secondary | ICD-10-CM | POA: Diagnosis not present

## 2013-03-27 DIAGNOSIS — N2581 Secondary hyperparathyroidism of renal origin: Secondary | ICD-10-CM | POA: Diagnosis not present

## 2013-03-28 DIAGNOSIS — N186 End stage renal disease: Secondary | ICD-10-CM | POA: Diagnosis not present

## 2013-03-29 DIAGNOSIS — Z23 Encounter for immunization: Secondary | ICD-10-CM | POA: Diagnosis not present

## 2013-03-29 DIAGNOSIS — N186 End stage renal disease: Secondary | ICD-10-CM | POA: Diagnosis not present

## 2013-03-29 DIAGNOSIS — N2581 Secondary hyperparathyroidism of renal origin: Secondary | ICD-10-CM | POA: Diagnosis not present

## 2013-04-19 DIAGNOSIS — I4891 Unspecified atrial fibrillation: Secondary | ICD-10-CM | POA: Diagnosis not present

## 2013-04-21 DIAGNOSIS — I359 Nonrheumatic aortic valve disorder, unspecified: Secondary | ICD-10-CM | POA: Diagnosis not present

## 2013-04-28 DIAGNOSIS — N186 End stage renal disease: Secondary | ICD-10-CM | POA: Diagnosis not present

## 2013-05-01 DIAGNOSIS — N2581 Secondary hyperparathyroidism of renal origin: Secondary | ICD-10-CM | POA: Diagnosis not present

## 2013-05-01 DIAGNOSIS — N186 End stage renal disease: Secondary | ICD-10-CM | POA: Diagnosis not present

## 2013-05-28 DIAGNOSIS — N186 End stage renal disease: Secondary | ICD-10-CM | POA: Diagnosis not present

## 2013-05-29 DIAGNOSIS — E878 Other disorders of electrolyte and fluid balance, not elsewhere classified: Secondary | ICD-10-CM | POA: Diagnosis not present

## 2013-05-29 DIAGNOSIS — N2581 Secondary hyperparathyroidism of renal origin: Secondary | ICD-10-CM | POA: Diagnosis not present

## 2013-05-29 DIAGNOSIS — N186 End stage renal disease: Secondary | ICD-10-CM | POA: Diagnosis not present

## 2013-05-31 DIAGNOSIS — E878 Other disorders of electrolyte and fluid balance, not elsewhere classified: Secondary | ICD-10-CM | POA: Diagnosis not present

## 2013-05-31 DIAGNOSIS — N186 End stage renal disease: Secondary | ICD-10-CM | POA: Diagnosis not present

## 2013-05-31 DIAGNOSIS — N2581 Secondary hyperparathyroidism of renal origin: Secondary | ICD-10-CM | POA: Diagnosis not present

## 2013-06-02 DIAGNOSIS — E878 Other disorders of electrolyte and fluid balance, not elsewhere classified: Secondary | ICD-10-CM | POA: Diagnosis not present

## 2013-06-02 DIAGNOSIS — N2581 Secondary hyperparathyroidism of renal origin: Secondary | ICD-10-CM | POA: Diagnosis not present

## 2013-06-02 DIAGNOSIS — N186 End stage renal disease: Secondary | ICD-10-CM | POA: Diagnosis not present

## 2013-06-05 DIAGNOSIS — N186 End stage renal disease: Secondary | ICD-10-CM | POA: Diagnosis not present

## 2013-06-05 DIAGNOSIS — E878 Other disorders of electrolyte and fluid balance, not elsewhere classified: Secondary | ICD-10-CM | POA: Diagnosis not present

## 2013-06-05 DIAGNOSIS — N2581 Secondary hyperparathyroidism of renal origin: Secondary | ICD-10-CM | POA: Diagnosis not present

## 2013-06-07 DIAGNOSIS — N2581 Secondary hyperparathyroidism of renal origin: Secondary | ICD-10-CM | POA: Diagnosis not present

## 2013-06-07 DIAGNOSIS — N186 End stage renal disease: Secondary | ICD-10-CM | POA: Diagnosis not present

## 2013-06-07 DIAGNOSIS — E878 Other disorders of electrolyte and fluid balance, not elsewhere classified: Secondary | ICD-10-CM | POA: Diagnosis not present

## 2013-06-08 DIAGNOSIS — E878 Other disorders of electrolyte and fluid balance, not elsewhere classified: Secondary | ICD-10-CM | POA: Diagnosis not present

## 2013-06-08 DIAGNOSIS — N2581 Secondary hyperparathyroidism of renal origin: Secondary | ICD-10-CM | POA: Diagnosis not present

## 2013-06-08 DIAGNOSIS — N186 End stage renal disease: Secondary | ICD-10-CM | POA: Diagnosis not present

## 2013-06-09 DIAGNOSIS — N186 End stage renal disease: Secondary | ICD-10-CM | POA: Diagnosis not present

## 2013-06-09 DIAGNOSIS — E878 Other disorders of electrolyte and fluid balance, not elsewhere classified: Secondary | ICD-10-CM | POA: Diagnosis not present

## 2013-06-09 DIAGNOSIS — N2581 Secondary hyperparathyroidism of renal origin: Secondary | ICD-10-CM | POA: Diagnosis not present

## 2013-06-12 DIAGNOSIS — N186 End stage renal disease: Secondary | ICD-10-CM | POA: Diagnosis not present

## 2013-06-12 DIAGNOSIS — N2581 Secondary hyperparathyroidism of renal origin: Secondary | ICD-10-CM | POA: Diagnosis not present

## 2013-06-12 DIAGNOSIS — E878 Other disorders of electrolyte and fluid balance, not elsewhere classified: Secondary | ICD-10-CM | POA: Diagnosis not present

## 2013-06-14 DIAGNOSIS — N2581 Secondary hyperparathyroidism of renal origin: Secondary | ICD-10-CM | POA: Diagnosis not present

## 2013-06-14 DIAGNOSIS — N186 End stage renal disease: Secondary | ICD-10-CM | POA: Diagnosis not present

## 2013-06-14 DIAGNOSIS — E878 Other disorders of electrolyte and fluid balance, not elsewhere classified: Secondary | ICD-10-CM | POA: Diagnosis not present

## 2013-06-16 DIAGNOSIS — N2581 Secondary hyperparathyroidism of renal origin: Secondary | ICD-10-CM | POA: Diagnosis not present

## 2013-06-16 DIAGNOSIS — N186 End stage renal disease: Secondary | ICD-10-CM | POA: Diagnosis not present

## 2013-06-16 DIAGNOSIS — E878 Other disorders of electrolyte and fluid balance, not elsewhere classified: Secondary | ICD-10-CM | POA: Diagnosis not present

## 2013-06-18 DIAGNOSIS — Z79899 Other long term (current) drug therapy: Secondary | ICD-10-CM | POA: Diagnosis not present

## 2013-06-18 DIAGNOSIS — D631 Anemia in chronic kidney disease: Secondary | ICD-10-CM | POA: Diagnosis present

## 2013-06-18 DIAGNOSIS — I4891 Unspecified atrial fibrillation: Secondary | ICD-10-CM | POA: Diagnosis not present

## 2013-06-18 DIAGNOSIS — I6509 Occlusion and stenosis of unspecified vertebral artery: Secondary | ICD-10-CM | POA: Diagnosis not present

## 2013-06-18 DIAGNOSIS — G40109 Localization-related (focal) (partial) symptomatic epilepsy and epileptic syndromes with simple partial seizures, not intractable, without status epilepticus: Secondary | ICD-10-CM | POA: Diagnosis not present

## 2013-06-18 DIAGNOSIS — K439 Ventral hernia without obstruction or gangrene: Secondary | ICD-10-CM | POA: Diagnosis present

## 2013-06-18 DIAGNOSIS — Z7901 Long term (current) use of anticoagulants: Secondary | ICD-10-CM | POA: Diagnosis not present

## 2013-06-18 DIAGNOSIS — R569 Unspecified convulsions: Secondary | ICD-10-CM | POA: Diagnosis not present

## 2013-06-18 DIAGNOSIS — N186 End stage renal disease: Secondary | ICD-10-CM | POA: Diagnosis not present

## 2013-06-18 DIAGNOSIS — E871 Hypo-osmolality and hyponatremia: Secondary | ICD-10-CM | POA: Diagnosis not present

## 2013-06-18 DIAGNOSIS — E46 Unspecified protein-calorie malnutrition: Secondary | ICD-10-CM | POA: Diagnosis present

## 2013-06-18 DIAGNOSIS — G40401 Other generalized epilepsy and epileptic syndromes, not intractable, with status epilepticus: Secondary | ICD-10-CM | POA: Diagnosis not present

## 2013-06-18 DIAGNOSIS — Q613 Polycystic kidney, unspecified: Secondary | ICD-10-CM | POA: Diagnosis not present

## 2013-06-18 DIAGNOSIS — E875 Hyperkalemia: Secondary | ICD-10-CM | POA: Diagnosis not present

## 2013-06-18 DIAGNOSIS — I671 Cerebral aneurysm, nonruptured: Secondary | ICD-10-CM | POA: Diagnosis present

## 2013-06-18 DIAGNOSIS — Z992 Dependence on renal dialysis: Secondary | ICD-10-CM | POA: Diagnosis not present

## 2013-06-18 DIAGNOSIS — I120 Hypertensive chronic kidney disease with stage 5 chronic kidney disease or end stage renal disease: Secondary | ICD-10-CM | POA: Diagnosis not present

## 2013-06-18 DIAGNOSIS — G40209 Localization-related (focal) (partial) symptomatic epilepsy and epileptic syndromes with complex partial seizures, not intractable, without status epilepticus: Secondary | ICD-10-CM | POA: Diagnosis not present

## 2013-06-18 DIAGNOSIS — E8809 Other disorders of plasma-protein metabolism, not elsewhere classified: Secondary | ICD-10-CM | POA: Diagnosis present

## 2013-06-18 DIAGNOSIS — Q048 Other specified congenital malformations of brain: Secondary | ICD-10-CM | POA: Diagnosis not present

## 2013-06-18 DIAGNOSIS — R0602 Shortness of breath: Secondary | ICD-10-CM | POA: Diagnosis not present

## 2013-06-23 DIAGNOSIS — N2581 Secondary hyperparathyroidism of renal origin: Secondary | ICD-10-CM | POA: Diagnosis not present

## 2013-06-23 DIAGNOSIS — I4891 Unspecified atrial fibrillation: Secondary | ICD-10-CM | POA: Diagnosis not present

## 2013-06-23 DIAGNOSIS — N186 End stage renal disease: Secondary | ICD-10-CM | POA: Diagnosis not present

## 2013-06-23 DIAGNOSIS — E878 Other disorders of electrolyte and fluid balance, not elsewhere classified: Secondary | ICD-10-CM | POA: Diagnosis not present

## 2013-06-25 DIAGNOSIS — E878 Other disorders of electrolyte and fluid balance, not elsewhere classified: Secondary | ICD-10-CM | POA: Diagnosis not present

## 2013-06-25 DIAGNOSIS — N2581 Secondary hyperparathyroidism of renal origin: Secondary | ICD-10-CM | POA: Diagnosis not present

## 2013-06-25 DIAGNOSIS — N186 End stage renal disease: Secondary | ICD-10-CM | POA: Diagnosis not present

## 2013-06-27 DIAGNOSIS — I4891 Unspecified atrial fibrillation: Secondary | ICD-10-CM | POA: Diagnosis not present

## 2013-06-27 DIAGNOSIS — N186 End stage renal disease: Secondary | ICD-10-CM | POA: Diagnosis not present

## 2013-06-27 DIAGNOSIS — N2581 Secondary hyperparathyroidism of renal origin: Secondary | ICD-10-CM | POA: Diagnosis not present

## 2013-06-27 DIAGNOSIS — E878 Other disorders of electrolyte and fluid balance, not elsewhere classified: Secondary | ICD-10-CM | POA: Diagnosis not present

## 2013-06-28 DIAGNOSIS — N186 End stage renal disease: Secondary | ICD-10-CM | POA: Diagnosis not present

## 2013-06-30 DIAGNOSIS — N2581 Secondary hyperparathyroidism of renal origin: Secondary | ICD-10-CM | POA: Diagnosis not present

## 2013-06-30 DIAGNOSIS — N186 End stage renal disease: Secondary | ICD-10-CM | POA: Diagnosis not present

## 2013-07-05 DIAGNOSIS — I4891 Unspecified atrial fibrillation: Secondary | ICD-10-CM | POA: Diagnosis not present

## 2013-07-12 DIAGNOSIS — I4891 Unspecified atrial fibrillation: Secondary | ICD-10-CM | POA: Diagnosis not present

## 2013-07-12 DIAGNOSIS — I871 Compression of vein: Secondary | ICD-10-CM | POA: Diagnosis not present

## 2013-07-12 DIAGNOSIS — N186 End stage renal disease: Secondary | ICD-10-CM | POA: Diagnosis not present

## 2013-07-12 DIAGNOSIS — T82898A Other specified complication of vascular prosthetic devices, implants and grafts, initial encounter: Secondary | ICD-10-CM | POA: Diagnosis not present

## 2013-07-19 DIAGNOSIS — I4891 Unspecified atrial fibrillation: Secondary | ICD-10-CM | POA: Diagnosis not present

## 2013-07-20 DIAGNOSIS — I2541 Coronary artery aneurysm: Secondary | ICD-10-CM | POA: Diagnosis not present

## 2013-07-20 DIAGNOSIS — Q048 Other specified congenital malformations of brain: Secondary | ICD-10-CM | POA: Diagnosis not present

## 2013-07-20 DIAGNOSIS — I671 Cerebral aneurysm, nonruptured: Secondary | ICD-10-CM | POA: Diagnosis not present

## 2013-07-26 DIAGNOSIS — N186 End stage renal disease: Secondary | ICD-10-CM | POA: Diagnosis not present

## 2013-07-26 DIAGNOSIS — I4891 Unspecified atrial fibrillation: Secondary | ICD-10-CM | POA: Diagnosis not present

## 2013-07-29 DIAGNOSIS — N186 End stage renal disease: Secondary | ICD-10-CM | POA: Diagnosis not present

## 2013-07-31 DIAGNOSIS — N186 End stage renal disease: Secondary | ICD-10-CM | POA: Diagnosis not present

## 2013-07-31 DIAGNOSIS — N2581 Secondary hyperparathyroidism of renal origin: Secondary | ICD-10-CM | POA: Diagnosis not present

## 2013-08-02 DIAGNOSIS — I4891 Unspecified atrial fibrillation: Secondary | ICD-10-CM | POA: Diagnosis not present

## 2013-08-09 DIAGNOSIS — I4891 Unspecified atrial fibrillation: Secondary | ICD-10-CM | POA: Diagnosis not present

## 2013-08-23 DIAGNOSIS — I4891 Unspecified atrial fibrillation: Secondary | ICD-10-CM | POA: Diagnosis not present

## 2013-08-26 DIAGNOSIS — N186 End stage renal disease: Secondary | ICD-10-CM | POA: Diagnosis not present

## 2013-08-28 DIAGNOSIS — N2581 Secondary hyperparathyroidism of renal origin: Secondary | ICD-10-CM | POA: Diagnosis not present

## 2013-08-28 DIAGNOSIS — N186 End stage renal disease: Secondary | ICD-10-CM | POA: Diagnosis not present

## 2013-09-20 DIAGNOSIS — I4891 Unspecified atrial fibrillation: Secondary | ICD-10-CM | POA: Diagnosis not present

## 2013-09-20 DIAGNOSIS — R569 Unspecified convulsions: Secondary | ICD-10-CM | POA: Diagnosis not present

## 2013-09-25 DIAGNOSIS — I4891 Unspecified atrial fibrillation: Secondary | ICD-10-CM | POA: Diagnosis not present

## 2013-09-26 DIAGNOSIS — N186 End stage renal disease: Secondary | ICD-10-CM | POA: Diagnosis not present

## 2013-09-27 DIAGNOSIS — N2581 Secondary hyperparathyroidism of renal origin: Secondary | ICD-10-CM | POA: Diagnosis not present

## 2013-09-27 DIAGNOSIS — N186 End stage renal disease: Secondary | ICD-10-CM | POA: Diagnosis not present

## 2013-09-29 DIAGNOSIS — I4891 Unspecified atrial fibrillation: Secondary | ICD-10-CM | POA: Diagnosis not present

## 2013-10-18 DIAGNOSIS — R569 Unspecified convulsions: Secondary | ICD-10-CM | POA: Diagnosis not present

## 2013-10-18 DIAGNOSIS — I4891 Unspecified atrial fibrillation: Secondary | ICD-10-CM | POA: Diagnosis not present

## 2013-10-20 DIAGNOSIS — I2541 Coronary artery aneurysm: Secondary | ICD-10-CM | POA: Diagnosis not present

## 2013-10-20 DIAGNOSIS — I671 Cerebral aneurysm, nonruptured: Secondary | ICD-10-CM | POA: Diagnosis not present

## 2013-10-25 DIAGNOSIS — I871 Compression of vein: Secondary | ICD-10-CM | POA: Diagnosis not present

## 2013-10-25 DIAGNOSIS — T82898A Other specified complication of vascular prosthetic devices, implants and grafts, initial encounter: Secondary | ICD-10-CM | POA: Diagnosis not present

## 2013-10-25 DIAGNOSIS — N186 End stage renal disease: Secondary | ICD-10-CM | POA: Diagnosis not present

## 2013-10-26 DIAGNOSIS — N186 End stage renal disease: Secondary | ICD-10-CM | POA: Diagnosis not present

## 2013-10-27 DIAGNOSIS — N2581 Secondary hyperparathyroidism of renal origin: Secondary | ICD-10-CM | POA: Diagnosis not present

## 2013-10-27 DIAGNOSIS — N186 End stage renal disease: Secondary | ICD-10-CM | POA: Diagnosis not present

## 2013-10-27 IMAGING — XA IR SHUNTOGRAM/ FISTULAGRAM *R*
1 series · 15 of 24 positions shown · IV contrast (IODINE)
Comparison: none

CLINICAL DATA: Decreased access flow

AV SHUNTOGRAM :
Procedure: The right thigh was prepped and draped in a sterile
fashion.  An 18 gauge Angiocath was inserted into the AV graft.
Contrast was injected.  No complication.

[Series 300: ir declot *r* · 15 of 28 slices shown]
[im 1/28]
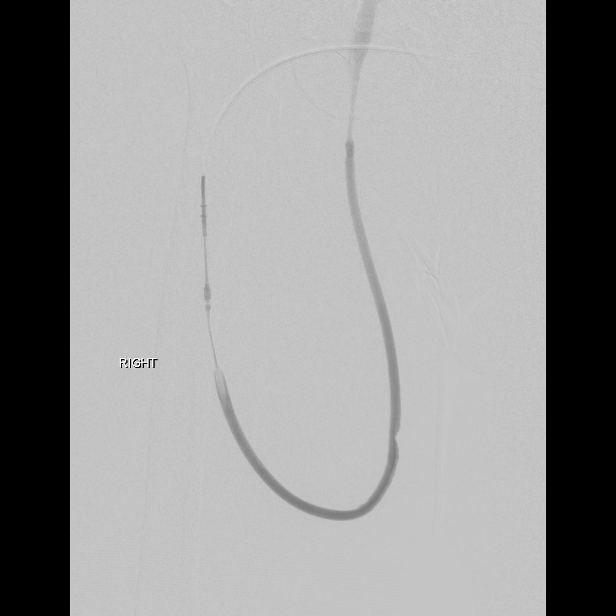
[im 3/28]
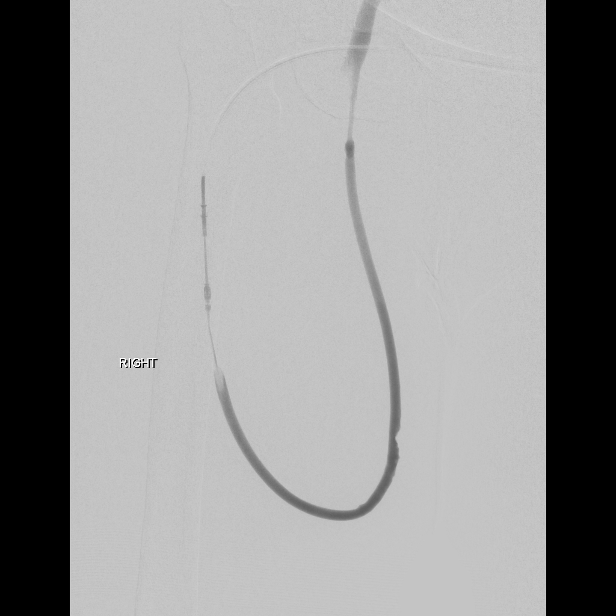
[im 5/28]
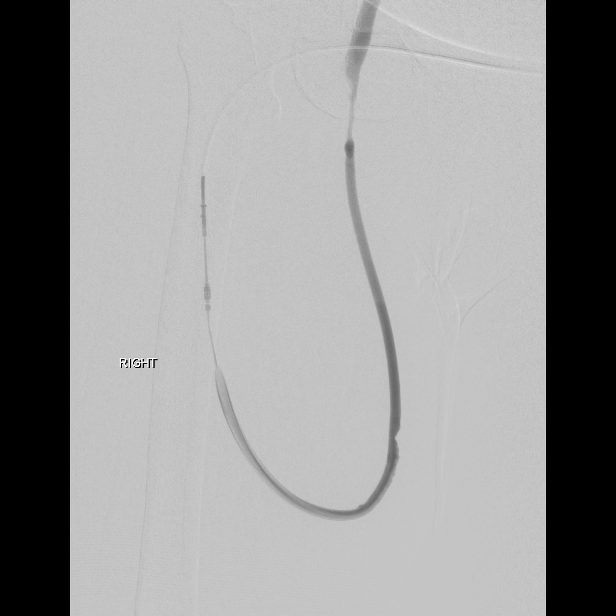
[im 6/28]
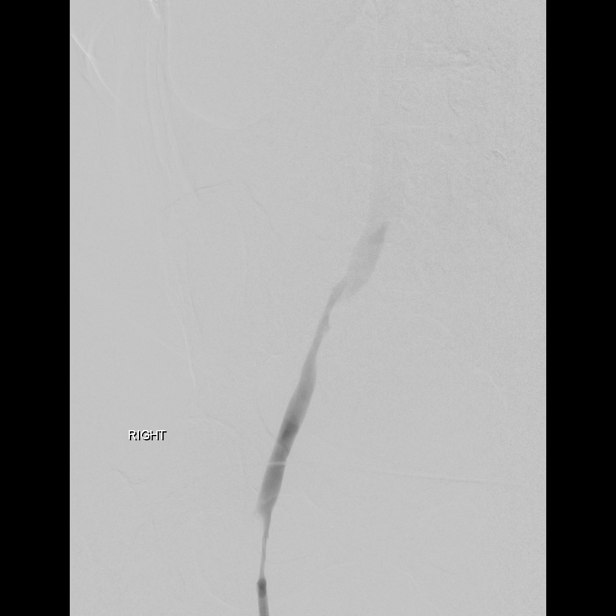
[im 9/28]
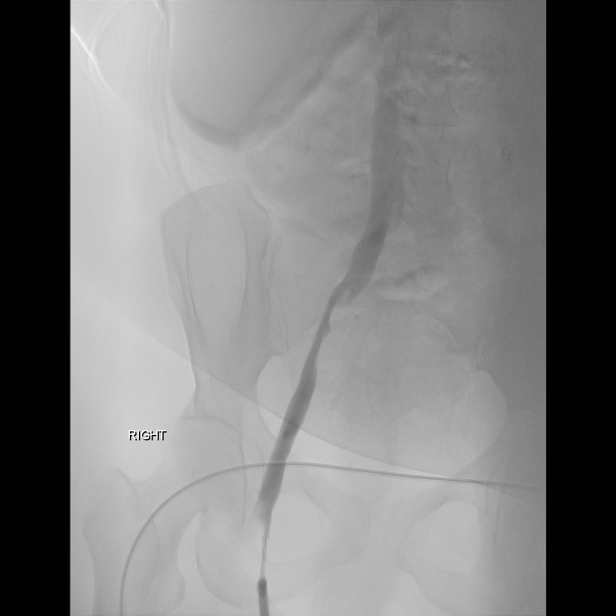
[im 10/28]
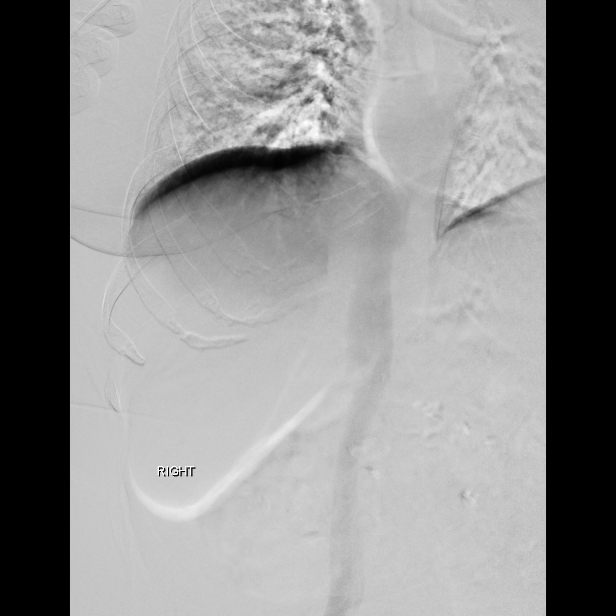
[im 12/28]
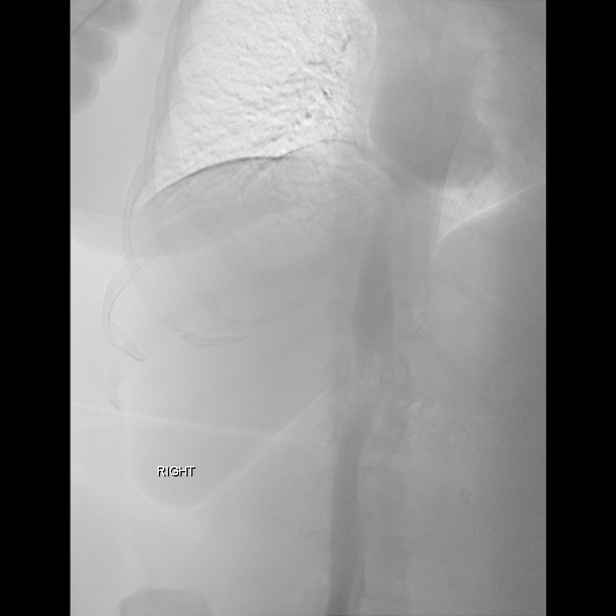
[im 15/28]
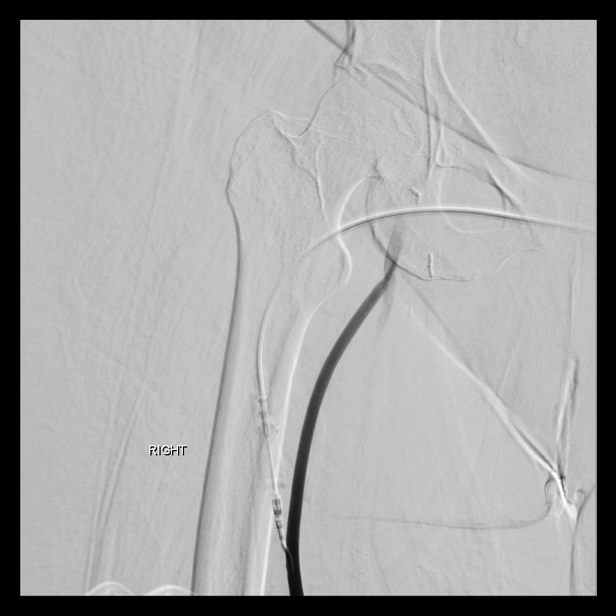
[im 16/28]
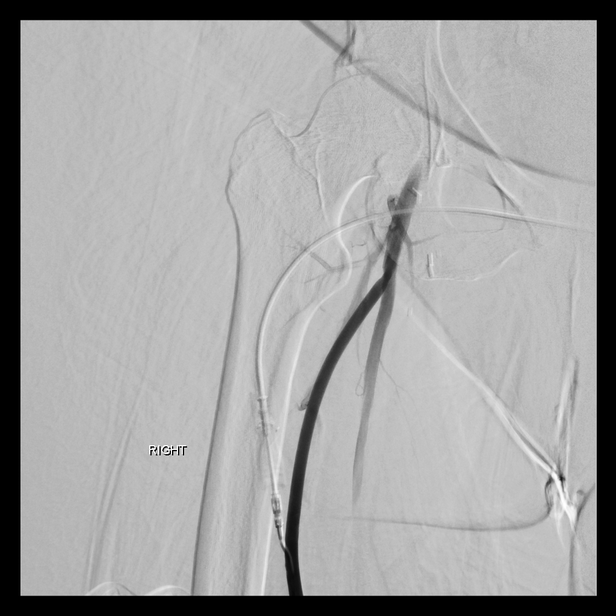
[im 18/28]
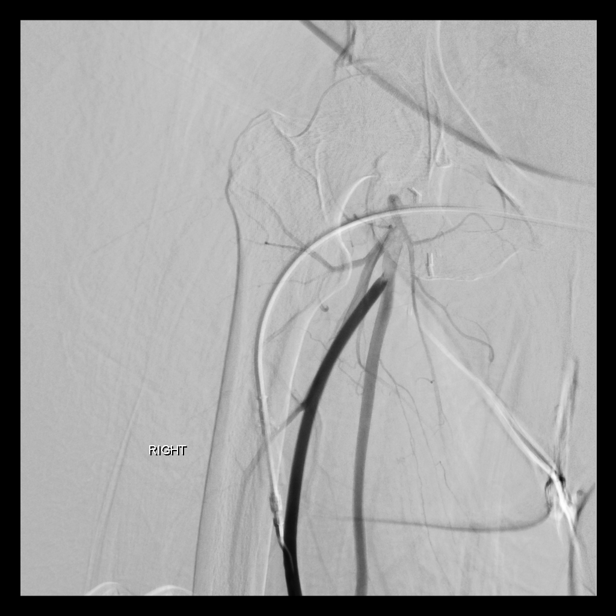
[im 19/28]
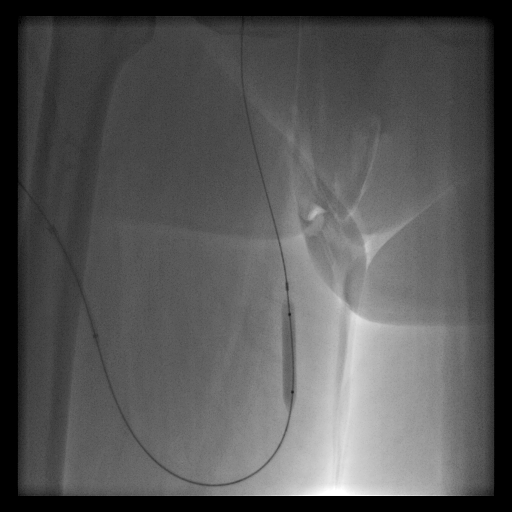
[im 22/28]
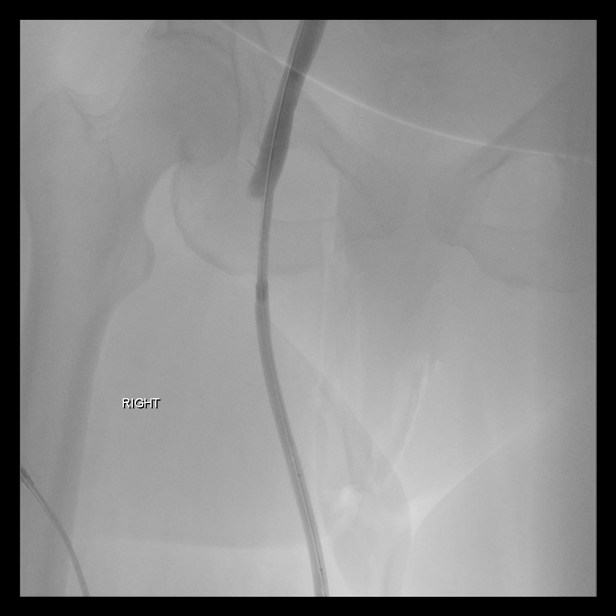
[im 24/28]
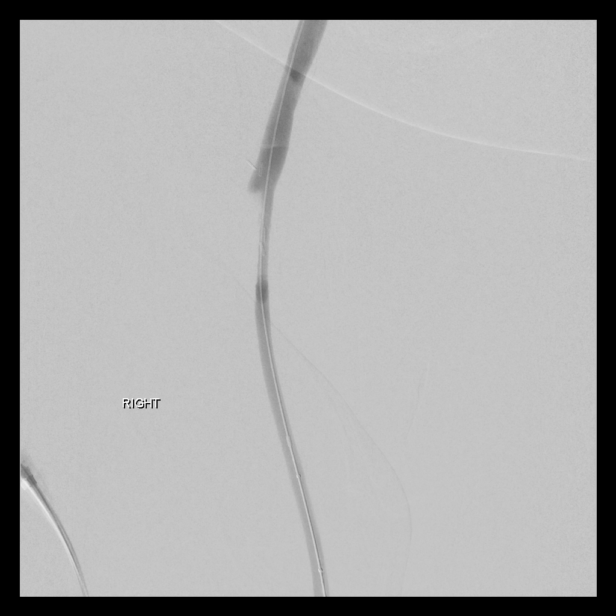
[im 25/28]
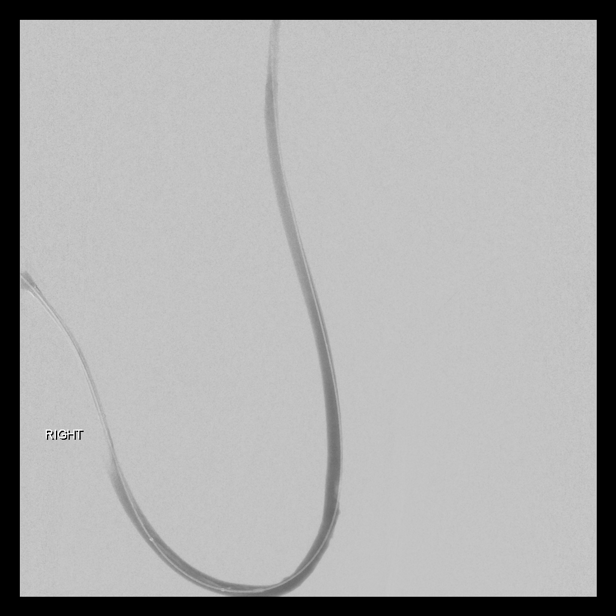
[im 28/28]
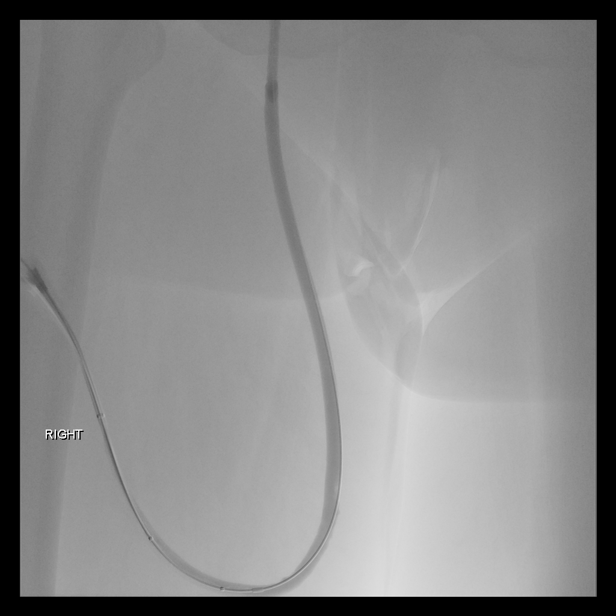

[15 of 24 positions shown; findings below may reference images not displayed]

FINDINGS: There is a small stenosis in the venous limb of the graft
as well as a significant stenosis at the venous anastomosis.
Moderate narrowing in the right common iliac vein without
collateral venous formation.
IMPRESSION: Multiple venous stenoses.

VENOUS PTA

Procedure:  The 18 gauge Angiocath was exchanged over Notorius Sabina for
a 6 French sheath. The the graft and venous anastomotic stenoses
were dilated to 7 mm. Repeat imaging was performed. The iliac vein
stenosis was not dilated at this time.  The sheath was removed and
hemostasis was achieved with a 0 Prolene figure-of-eight stitch.
No complications.
FINDINGS: Postangioplasty imaging demonstrates wide patency of the
graft and venous anastomotic stenoses.
IMPRESSION: Successful dilatation of venous anastomotic and graft stenoses to 7
mm.

## 2013-10-30 DIAGNOSIS — N186 End stage renal disease: Secondary | ICD-10-CM | POA: Diagnosis not present

## 2013-10-30 DIAGNOSIS — N2581 Secondary hyperparathyroidism of renal origin: Secondary | ICD-10-CM | POA: Diagnosis not present

## 2013-11-01 DIAGNOSIS — N2581 Secondary hyperparathyroidism of renal origin: Secondary | ICD-10-CM | POA: Diagnosis not present

## 2013-11-01 DIAGNOSIS — N186 End stage renal disease: Secondary | ICD-10-CM | POA: Diagnosis not present

## 2013-11-04 DIAGNOSIS — N2581 Secondary hyperparathyroidism of renal origin: Secondary | ICD-10-CM | POA: Diagnosis not present

## 2013-11-04 DIAGNOSIS — N186 End stage renal disease: Secondary | ICD-10-CM | POA: Diagnosis not present

## 2013-11-06 DIAGNOSIS — N186 End stage renal disease: Secondary | ICD-10-CM | POA: Diagnosis not present

## 2013-11-06 DIAGNOSIS — N2581 Secondary hyperparathyroidism of renal origin: Secondary | ICD-10-CM | POA: Diagnosis not present

## 2013-11-08 DIAGNOSIS — N2581 Secondary hyperparathyroidism of renal origin: Secondary | ICD-10-CM | POA: Diagnosis not present

## 2013-11-08 DIAGNOSIS — N186 End stage renal disease: Secondary | ICD-10-CM | POA: Diagnosis not present

## 2013-11-10 DIAGNOSIS — N186 End stage renal disease: Secondary | ICD-10-CM | POA: Diagnosis not present

## 2013-11-10 DIAGNOSIS — N2581 Secondary hyperparathyroidism of renal origin: Secondary | ICD-10-CM | POA: Diagnosis not present

## 2013-11-13 DIAGNOSIS — N186 End stage renal disease: Secondary | ICD-10-CM | POA: Diagnosis not present

## 2013-11-13 DIAGNOSIS — N2581 Secondary hyperparathyroidism of renal origin: Secondary | ICD-10-CM | POA: Diagnosis not present

## 2013-11-15 DIAGNOSIS — N2581 Secondary hyperparathyroidism of renal origin: Secondary | ICD-10-CM | POA: Diagnosis not present

## 2013-11-15 DIAGNOSIS — N186 End stage renal disease: Secondary | ICD-10-CM | POA: Diagnosis not present

## 2013-11-17 DIAGNOSIS — N2581 Secondary hyperparathyroidism of renal origin: Secondary | ICD-10-CM | POA: Diagnosis not present

## 2013-11-17 DIAGNOSIS — N186 End stage renal disease: Secondary | ICD-10-CM | POA: Diagnosis not present

## 2013-11-20 DIAGNOSIS — N186 End stage renal disease: Secondary | ICD-10-CM | POA: Diagnosis not present

## 2013-11-20 DIAGNOSIS — N2581 Secondary hyperparathyroidism of renal origin: Secondary | ICD-10-CM | POA: Diagnosis not present

## 2013-11-22 DIAGNOSIS — I4891 Unspecified atrial fibrillation: Secondary | ICD-10-CM | POA: Diagnosis not present

## 2013-11-22 DIAGNOSIS — N2581 Secondary hyperparathyroidism of renal origin: Secondary | ICD-10-CM | POA: Diagnosis not present

## 2013-11-22 DIAGNOSIS — R569 Unspecified convulsions: Secondary | ICD-10-CM | POA: Diagnosis not present

## 2013-11-22 DIAGNOSIS — N186 End stage renal disease: Secondary | ICD-10-CM | POA: Diagnosis not present

## 2013-11-24 DIAGNOSIS — N186 End stage renal disease: Secondary | ICD-10-CM | POA: Diagnosis not present

## 2013-11-24 DIAGNOSIS — N2581 Secondary hyperparathyroidism of renal origin: Secondary | ICD-10-CM | POA: Diagnosis not present

## 2013-11-26 DIAGNOSIS — N186 End stage renal disease: Secondary | ICD-10-CM | POA: Diagnosis not present

## 2013-11-27 DIAGNOSIS — N186 End stage renal disease: Secondary | ICD-10-CM | POA: Diagnosis not present

## 2013-11-27 DIAGNOSIS — N2581 Secondary hyperparathyroidism of renal origin: Secondary | ICD-10-CM | POA: Diagnosis not present

## 2013-11-29 DIAGNOSIS — I4891 Unspecified atrial fibrillation: Secondary | ICD-10-CM | POA: Diagnosis not present

## 2013-11-29 DIAGNOSIS — Z09 Encounter for follow-up examination after completed treatment for conditions other than malignant neoplasm: Secondary | ICD-10-CM | POA: Diagnosis not present

## 2013-12-20 DIAGNOSIS — R569 Unspecified convulsions: Secondary | ICD-10-CM | POA: Diagnosis not present

## 2013-12-20 DIAGNOSIS — I4891 Unspecified atrial fibrillation: Secondary | ICD-10-CM | POA: Diagnosis not present

## 2013-12-25 ENCOUNTER — Encounter: Payer: Self-pay | Admitting: Cardiology

## 2013-12-25 DIAGNOSIS — I4891 Unspecified atrial fibrillation: Secondary | ICD-10-CM | POA: Diagnosis not present

## 2013-12-25 LAB — PROTIME-INR

## 2013-12-26 DIAGNOSIS — N186 End stage renal disease: Secondary | ICD-10-CM | POA: Diagnosis not present

## 2013-12-27 DIAGNOSIS — N186 End stage renal disease: Secondary | ICD-10-CM | POA: Diagnosis not present

## 2013-12-27 DIAGNOSIS — N2581 Secondary hyperparathyroidism of renal origin: Secondary | ICD-10-CM | POA: Diagnosis not present

## 2013-12-28 DIAGNOSIS — N186 End stage renal disease: Secondary | ICD-10-CM | POA: Diagnosis not present

## 2013-12-28 DIAGNOSIS — T82898A Other specified complication of vascular prosthetic devices, implants and grafts, initial encounter: Secondary | ICD-10-CM | POA: Diagnosis not present

## 2013-12-28 DIAGNOSIS — I871 Compression of vein: Secondary | ICD-10-CM | POA: Diagnosis not present

## 2014-01-10 ENCOUNTER — Ambulatory Visit: Payer: Medicare Other | Admitting: Cardiology

## 2014-01-10 ENCOUNTER — Telehealth: Payer: Self-pay | Admitting: *Deleted

## 2014-01-10 NOTE — Telephone Encounter (Signed)
Informed [place] that they will have to continue to manage pt's coumadin until after she sees Dr Jens Somrenshaw on 02/14/14. LMOM for pt, about coumadin management until she see the MD, that HD will continue to manage until that time and at the visit with Dr Jens Somrenshaw ask him to set her up with one of our coumadin clinics and unfortunately there is not one at [facility].

## 2014-01-17 DIAGNOSIS — I4891 Unspecified atrial fibrillation: Secondary | ICD-10-CM | POA: Diagnosis not present

## 2014-01-17 DIAGNOSIS — R569 Unspecified convulsions: Secondary | ICD-10-CM | POA: Diagnosis not present

## 2014-01-26 DIAGNOSIS — N186 End stage renal disease: Secondary | ICD-10-CM | POA: Diagnosis not present

## 2014-01-29 DIAGNOSIS — N186 End stage renal disease: Secondary | ICD-10-CM | POA: Diagnosis not present

## 2014-01-29 DIAGNOSIS — N2581 Secondary hyperparathyroidism of renal origin: Secondary | ICD-10-CM | POA: Diagnosis not present

## 2014-02-14 ENCOUNTER — Ambulatory Visit: Payer: Medicare Other | Admitting: Cardiology

## 2014-02-21 DIAGNOSIS — R569 Unspecified convulsions: Secondary | ICD-10-CM | POA: Diagnosis not present

## 2014-02-21 DIAGNOSIS — I4891 Unspecified atrial fibrillation: Secondary | ICD-10-CM | POA: Diagnosis not present

## 2014-02-26 DIAGNOSIS — N186 End stage renal disease: Secondary | ICD-10-CM | POA: Diagnosis not present

## 2014-02-28 DIAGNOSIS — N186 End stage renal disease: Secondary | ICD-10-CM | POA: Diagnosis not present

## 2014-02-28 DIAGNOSIS — N2581 Secondary hyperparathyroidism of renal origin: Secondary | ICD-10-CM | POA: Diagnosis not present

## 2014-03-21 DIAGNOSIS — R569 Unspecified convulsions: Secondary | ICD-10-CM | POA: Diagnosis not present

## 2014-03-21 DIAGNOSIS — I4891 Unspecified atrial fibrillation: Secondary | ICD-10-CM | POA: Diagnosis not present

## 2014-03-28 DIAGNOSIS — N186 End stage renal disease: Secondary | ICD-10-CM | POA: Diagnosis not present

## 2014-03-30 DIAGNOSIS — N2581 Secondary hyperparathyroidism of renal origin: Secondary | ICD-10-CM | POA: Diagnosis not present

## 2014-03-30 DIAGNOSIS — N186 End stage renal disease: Secondary | ICD-10-CM | POA: Diagnosis not present

## 2014-03-30 DIAGNOSIS — Z23 Encounter for immunization: Secondary | ICD-10-CM | POA: Diagnosis not present

## 2014-04-02 DIAGNOSIS — I4891 Unspecified atrial fibrillation: Secondary | ICD-10-CM | POA: Diagnosis not present

## 2014-04-04 ENCOUNTER — Ambulatory Visit (INDEPENDENT_AMBULATORY_CARE_PROVIDER_SITE_OTHER): Payer: Medicare Other | Admitting: Cardiology

## 2014-04-04 ENCOUNTER — Encounter: Payer: Self-pay | Admitting: Cardiology

## 2014-04-04 ENCOUNTER — Encounter: Payer: Self-pay | Admitting: *Deleted

## 2014-04-04 VITALS — BP 90/74 | HR 91 | Ht 64.0 in | Wt 239.8 lb

## 2014-04-04 DIAGNOSIS — I482 Chronic atrial fibrillation, unspecified: Secondary | ICD-10-CM

## 2014-04-04 DIAGNOSIS — I4891 Unspecified atrial fibrillation: Secondary | ICD-10-CM

## 2014-04-04 DIAGNOSIS — I10 Essential (primary) hypertension: Secondary | ICD-10-CM

## 2014-04-04 DIAGNOSIS — N186 End stage renal disease: Secondary | ICD-10-CM | POA: Diagnosis not present

## 2014-04-04 NOTE — Assessment & Plan Note (Signed)
Blood pressure controlled. Continue present medications. 

## 2014-04-04 NOTE — Progress Notes (Signed)
HPI: Evaluate atrial fibrillation. Previously seen here but not since 12/10. Note a previous nuclear study in October of 2004 showed breast attenuation but no ischemia. The ejection fraction was 44% but may have been inaccurate due to the patient's atrial fibrillation. Her last echocardiogram in April of 2007 showed normal LV function, trivial aortic insufficiency and mitral regurgitation. The right atrium was mildly to moderately dilated and the left atrium was moderately to markedly dilated. Atrial fibrillation is treated with rate control and anticoagulation.  Patient denies dyspnea, chest pain, palpitations or syncope. Occasional mild pedal edema.  Current Outpatient Prescriptions  Medication Sig Dispense Refill  . amiodarone (PACERONE) 200 MG tablet Take 200 mg by mouth daily.        . calcium acetate (PHOSLO) 667 MG capsule Take 667 mg by mouth 3 (three) times daily with meals.        . cinacalcet (SENSIPAR) 30 MG tablet Take 30 mg by mouth daily.        . digoxin (LANOXIN) 0.125 MG tablet Take 125 mcg by mouth 2 (two) times a week. On Tuesday and Friday      . fenofibrate (TRICOR) 145 MG tablet Take 145 mg by mouth daily.        . metoprolol (LOPRESSOR) 100 MG tablet Take 100 mg by mouth daily.        . sevelamer (RENVELA) 800 MG tablet Take 2,400 mg by mouth 3 (three) times daily before meals.       . warfarin (COUMADIN) 5 MG tablet Take 2.5-5 mg by mouth daily. Take 1 tablet on Monday and Friday, Then take 0.5 tablet the rest of the week       No current facility-administered medications for this visit.    Allergies  Allergen Reactions  . Ancef [Cefazolin Sodium] Itching    Past Medical History  Diagnosis Date  . Atrial fibrillation   . Hypercholesterolemia   . HTN (hypertension)   . Polycystic kidney disease   . Supraclinoid carotid artery aneurysm, small     Past Surgical History  Procedure Laterality Date  . Thrombectomy      of right upper arm ateriovenous graft  with revision   . Rivght ovary removal      secondary to a cyst  . C-sections      x2  . Appendectomy    . Total knee arthroplasty  feb 2005    right knee  . Right oophorectomy      History   Social History  . Marital Status: Married    Spouse Name: N/A    Number of Children: 2  . Years of Education: N/A   Occupational History  . Not on file.   Social History Main Topics  . Smoking status: Never Smoker   . Smokeless tobacco: Not on file  . Alcohol Use: Yes     Comment: occasional  . Drug Use: Not on file  . Sexual Activity: Not on file   Other Topics Concern  . Not on file   Social History Narrative  . No narrative on file    Family History  Problem Relation Age of Onset  . Stroke Mother   . Atrial fibrillation Father     ROS: no fevers or chills, productive cough, hemoptysis, dysphasia, odynophagia, melena, hematochezia, dysuria, hematuria, rash, seizure activity, orthopnea, PND, claudication. Remaining systems are negative.  Physical Exam:   Blood pressure 90/74, pulse 91, height 5\' 4"  (1.626 m), weight 239 lb  12.8 oz (108.773 kg).  General:  Well developed/chronically ill appearing in NAD Skin warm/dry Patient not depressed No peripheral clubbing Back-normal HEENT-normal/normal eyelids Neck supple/normal carotid upstroke bilaterally; no bruits; no JVD; no thyromegaly chest - CTA/ normal expansion CV - irregular/normal S1 and S2; no murmurs, rubs or gallops;  PMI nondisplaced Abdomen -NT/ND, no HSM, + bowel sounds, no bruit, Large abdominal wall hernia Ext-Trace edema, no chords, chronic skin changes Neuro-grossly nonfocal  ECG Atrial fibrillation, Low voltage, RV conduction delay, nonspecific ST changes

## 2014-04-04 NOTE — Assessment & Plan Note (Signed)
-   Dialysis per nephrology 

## 2014-04-04 NOTE — Assessment & Plan Note (Signed)
Patient remains in permanent atrial fibrillation. Continue renal dose digoxin and metoprolol for control. She is now taking amiodarone. Check 24-hour Holter monitor to make sure that rate is controlled. Repeat echocardiogram. Embolic risk factors of hypertension and female sex. Continue Coumadin which is monitored by nephrology.

## 2014-04-04 NOTE — Patient Instructions (Signed)
Your physician wants you to follow-up in: ONE YEAR WITH DR Shelda PalRENSHAW You will receive a reminder letter in the mail two months in advance. If you don't receive a letter, please call our office to schedule the follow-up appointment.   Your physician has recommended that you wear a 24 HOUR holter monitor. Holter monitors are medical devices that record the heart's electrical activity. Doctors most often use these monitors to diagnose arrhythmias. Arrhythmias are problems with the speed or rhythm of the heartbeat. The monitor is a small, portable device. You can wear one while you do your normal daily activities. This is usually used to diagnose what is causing palpitations/syncope (passing out).  Your physician has requested that you have an echocardiogram. Echocardiography is a painless test that uses sound waves to create images of your heart. It provides your doctor with information about the size and shape of your heart and how well your heart's chambers and valves are working. This procedure takes approximately one hour. There are no restrictions for this procedure.

## 2014-04-06 DIAGNOSIS — I4891 Unspecified atrial fibrillation: Secondary | ICD-10-CM | POA: Diagnosis not present

## 2014-04-06 LAB — PROTIME-INR: INR: 3.8 — AB (ref 0.9–1.1)

## 2014-04-09 ENCOUNTER — Ambulatory Visit (INDEPENDENT_AMBULATORY_CARE_PROVIDER_SITE_OTHER): Payer: Medicare Other | Admitting: Internal Medicine

## 2014-04-09 DIAGNOSIS — I4891 Unspecified atrial fibrillation: Secondary | ICD-10-CM

## 2014-04-13 ENCOUNTER — Telehealth: Payer: Self-pay | Admitting: Cardiology

## 2014-04-13 ENCOUNTER — Other Ambulatory Visit: Payer: Self-pay

## 2014-04-13 MED ORDER — METOPROLOL TARTRATE 100 MG PO TABS: 100.0000 mg | ORAL_TABLET | Freq: Every day | ORAL | Status: DC

## 2014-04-13 NOTE — Telephone Encounter (Signed)
Pt need a refill on Metoprolol 100 mg #30.Please call to [facility],(pt did not know the phone number).

## 2014-04-17 ENCOUNTER — Other Ambulatory Visit (HOSPITAL_COMMUNITY): Payer: Medicare Other

## 2014-04-20 DIAGNOSIS — I4891 Unspecified atrial fibrillation: Secondary | ICD-10-CM | POA: Diagnosis not present

## 2014-04-21 LAB — PROTIME-INR: INR: 3.3 — AB (ref 0.9–1.1)

## 2014-04-23 ENCOUNTER — Ambulatory Visit (INDEPENDENT_AMBULATORY_CARE_PROVIDER_SITE_OTHER): Payer: Medicare Other | Admitting: Cardiology

## 2014-04-23 DIAGNOSIS — I4891 Unspecified atrial fibrillation: Secondary | ICD-10-CM

## 2014-04-25 DIAGNOSIS — I4891 Unspecified atrial fibrillation: Secondary | ICD-10-CM | POA: Diagnosis not present

## 2014-04-25 DIAGNOSIS — R569 Unspecified convulsions: Secondary | ICD-10-CM | POA: Diagnosis not present

## 2014-04-28 DIAGNOSIS — N186 End stage renal disease: Secondary | ICD-10-CM | POA: Diagnosis not present

## 2014-04-28 DIAGNOSIS — Z992 Dependence on renal dialysis: Secondary | ICD-10-CM | POA: Diagnosis not present

## 2014-04-30 DIAGNOSIS — N186 End stage renal disease: Secondary | ICD-10-CM | POA: Diagnosis not present

## 2014-04-30 DIAGNOSIS — N2581 Secondary hyperparathyroidism of renal origin: Secondary | ICD-10-CM | POA: Diagnosis not present

## 2014-05-02 DIAGNOSIS — I4891 Unspecified atrial fibrillation: Secondary | ICD-10-CM | POA: Diagnosis not present

## 2014-05-03 ENCOUNTER — Telehealth: Payer: Self-pay

## 2014-05-03 MED ORDER — WARFARIN SODIUM 5 MG PO TABS: 5.0000 mg | ORAL_TABLET | ORAL | Status: DC

## 2014-05-03 NOTE — Telephone Encounter (Signed)
Rx sent 

## 2014-05-20 DIAGNOSIS — R569 Unspecified convulsions: Secondary | ICD-10-CM | POA: Diagnosis not present

## 2014-05-20 DIAGNOSIS — I4891 Unspecified atrial fibrillation: Secondary | ICD-10-CM | POA: Diagnosis not present

## 2014-05-20 LAB — PROTIME-INR: INR: 2.6 — AB (ref 0.9–1.1)

## 2014-05-22 ENCOUNTER — Ambulatory Visit (INDEPENDENT_AMBULATORY_CARE_PROVIDER_SITE_OTHER): Payer: Medicare Other | Admitting: Interventional Cardiology

## 2014-05-22 DIAGNOSIS — I4891 Unspecified atrial fibrillation: Secondary | ICD-10-CM

## 2014-05-28 DIAGNOSIS — N186 End stage renal disease: Secondary | ICD-10-CM | POA: Diagnosis not present

## 2014-05-30 DIAGNOSIS — N2581 Secondary hyperparathyroidism of renal origin: Secondary | ICD-10-CM | POA: Diagnosis not present

## 2014-05-30 DIAGNOSIS — N186 End stage renal disease: Secondary | ICD-10-CM | POA: Diagnosis not present

## 2014-06-08 ENCOUNTER — Other Ambulatory Visit: Payer: Self-pay | Admitting: *Deleted

## 2014-06-08 MED ORDER — DIGOXIN 125 MCG PO TABS: 125.0000 ug | ORAL_TABLET | ORAL | Status: DC

## 2014-06-08 NOTE — Telephone Encounter (Signed)
Yes will be fine. We had not seen her in awhile until recently.

## 2014-06-08 NOTE — Telephone Encounter (Signed)
Ok to refill digoxin for this patient? I do not see where Dr Jens Somrenshaw has ever filled this or checked digoxin level. Please advise. Thanks, MI

## 2014-06-18 DIAGNOSIS — Z09 Encounter for follow-up examination after completed treatment for conditions other than malignant neoplasm: Secondary | ICD-10-CM | POA: Diagnosis not present

## 2014-06-18 DIAGNOSIS — I4891 Unspecified atrial fibrillation: Secondary | ICD-10-CM | POA: Diagnosis not present

## 2014-06-19 ENCOUNTER — Ambulatory Visit (INDEPENDENT_AMBULATORY_CARE_PROVIDER_SITE_OTHER): Payer: Medicare Other | Admitting: Pharmacist

## 2014-06-19 DIAGNOSIS — I4891 Unspecified atrial fibrillation: Secondary | ICD-10-CM

## 2014-06-19 LAB — PROTIME-INR: INR: 3.5 — AB (ref 0.9–1.1)

## 2014-06-28 DIAGNOSIS — N186 End stage renal disease: Secondary | ICD-10-CM | POA: Diagnosis not present

## 2014-06-28 DIAGNOSIS — Z992 Dependence on renal dialysis: Secondary | ICD-10-CM | POA: Diagnosis not present

## 2014-06-30 DIAGNOSIS — N2581 Secondary hyperparathyroidism of renal origin: Secondary | ICD-10-CM | POA: Diagnosis not present

## 2014-06-30 DIAGNOSIS — N186 End stage renal disease: Secondary | ICD-10-CM | POA: Diagnosis not present

## 2014-07-09 DIAGNOSIS — I4891 Unspecified atrial fibrillation: Secondary | ICD-10-CM | POA: Diagnosis not present

## 2014-07-09 DIAGNOSIS — I359 Nonrheumatic aortic valve disorder, unspecified: Secondary | ICD-10-CM | POA: Diagnosis not present

## 2014-07-10 ENCOUNTER — Ambulatory Visit (INDEPENDENT_AMBULATORY_CARE_PROVIDER_SITE_OTHER): Payer: Medicare Other | Admitting: *Deleted

## 2014-07-10 DIAGNOSIS — I4891 Unspecified atrial fibrillation: Secondary | ICD-10-CM

## 2014-07-10 LAB — POCT INR: INR: 5.7

## 2014-07-16 DIAGNOSIS — I4891 Unspecified atrial fibrillation: Secondary | ICD-10-CM | POA: Diagnosis not present

## 2014-07-17 ENCOUNTER — Ambulatory Visit (INDEPENDENT_AMBULATORY_CARE_PROVIDER_SITE_OTHER): Payer: Medicare Other | Admitting: *Deleted

## 2014-07-17 DIAGNOSIS — I4891 Unspecified atrial fibrillation: Secondary | ICD-10-CM

## 2014-07-17 LAB — POCT INR: INR: 3.68

## 2014-07-25 DIAGNOSIS — I4891 Unspecified atrial fibrillation: Secondary | ICD-10-CM | POA: Diagnosis not present

## 2014-07-29 DIAGNOSIS — Z992 Dependence on renal dialysis: Secondary | ICD-10-CM | POA: Diagnosis not present

## 2014-07-29 DIAGNOSIS — N186 End stage renal disease: Secondary | ICD-10-CM | POA: Diagnosis not present

## 2014-07-30 DIAGNOSIS — N186 End stage renal disease: Secondary | ICD-10-CM | POA: Diagnosis not present

## 2014-07-30 DIAGNOSIS — N2581 Secondary hyperparathyroidism of renal origin: Secondary | ICD-10-CM | POA: Diagnosis not present

## 2014-08-01 DIAGNOSIS — I4891 Unspecified atrial fibrillation: Secondary | ICD-10-CM | POA: Diagnosis not present

## 2014-08-02 ENCOUNTER — Ambulatory Visit (INDEPENDENT_AMBULATORY_CARE_PROVIDER_SITE_OTHER): Payer: Medicare Other | Admitting: Cardiology

## 2014-08-02 DIAGNOSIS — I4891 Unspecified atrial fibrillation: Secondary | ICD-10-CM

## 2014-08-02 LAB — PROTIME-INR: INR: 5.1 — AB (ref 0.9–1.1)

## 2014-08-08 DIAGNOSIS — I4891 Unspecified atrial fibrillation: Secondary | ICD-10-CM | POA: Diagnosis not present

## 2014-08-09 LAB — PROTIME-INR: INR: 2.4 — AB (ref 0.9–1.1)

## 2014-08-10 ENCOUNTER — Ambulatory Visit (INDEPENDENT_AMBULATORY_CARE_PROVIDER_SITE_OTHER): Payer: Medicare Other | Admitting: Internal Medicine

## 2014-08-10 DIAGNOSIS — I4891 Unspecified atrial fibrillation: Secondary | ICD-10-CM

## 2014-08-22 DIAGNOSIS — I4891 Unspecified atrial fibrillation: Secondary | ICD-10-CM | POA: Diagnosis not present

## 2014-08-23 ENCOUNTER — Ambulatory Visit (INDEPENDENT_AMBULATORY_CARE_PROVIDER_SITE_OTHER): Payer: Medicare Other | Admitting: Cardiovascular Disease

## 2014-08-23 DIAGNOSIS — I4891 Unspecified atrial fibrillation: Secondary | ICD-10-CM

## 2014-08-23 LAB — PROTIME-INR: INR: 2.9 — AB (ref 0.9–1.1)

## 2014-08-27 DIAGNOSIS — N186 End stage renal disease: Secondary | ICD-10-CM | POA: Diagnosis not present

## 2014-08-27 DIAGNOSIS — Z992 Dependence on renal dialysis: Secondary | ICD-10-CM | POA: Diagnosis not present

## 2014-08-29 DIAGNOSIS — N2581 Secondary hyperparathyroidism of renal origin: Secondary | ICD-10-CM | POA: Diagnosis not present

## 2014-08-29 DIAGNOSIS — N186 End stage renal disease: Secondary | ICD-10-CM | POA: Diagnosis not present

## 2014-09-12 DIAGNOSIS — D631 Anemia in chronic kidney disease: Secondary | ICD-10-CM | POA: Diagnosis not present

## 2014-09-12 LAB — PROTIME-INR: INR: 3.9 — AB (ref 0.9–1.1)

## 2014-09-13 ENCOUNTER — Ambulatory Visit (INDEPENDENT_AMBULATORY_CARE_PROVIDER_SITE_OTHER): Payer: Medicare Other | Admitting: Cardiology

## 2014-09-13 DIAGNOSIS — I4891 Unspecified atrial fibrillation: Secondary | ICD-10-CM

## 2014-09-19 DIAGNOSIS — I4891 Unspecified atrial fibrillation: Secondary | ICD-10-CM | POA: Diagnosis not present

## 2014-09-26 DIAGNOSIS — I348 Other nonrheumatic mitral valve disorders: Secondary | ICD-10-CM | POA: Diagnosis not present

## 2014-09-26 LAB — PROTIME-INR: INR: 2.2 — AB (ref <=1.1)

## 2014-09-27 DIAGNOSIS — Z992 Dependence on renal dialysis: Secondary | ICD-10-CM | POA: Diagnosis not present

## 2014-09-27 DIAGNOSIS — Q612 Polycystic kidney, adult type: Secondary | ICD-10-CM | POA: Diagnosis not present

## 2014-09-27 DIAGNOSIS — N186 End stage renal disease: Secondary | ICD-10-CM | POA: Diagnosis not present

## 2014-09-28 DIAGNOSIS — N186 End stage renal disease: Secondary | ICD-10-CM | POA: Diagnosis not present

## 2014-09-28 DIAGNOSIS — N2581 Secondary hyperparathyroidism of renal origin: Secondary | ICD-10-CM | POA: Diagnosis not present

## 2014-10-03 ENCOUNTER — Ambulatory Visit (INDEPENDENT_AMBULATORY_CARE_PROVIDER_SITE_OTHER): Payer: Medicare Other | Admitting: Cardiology

## 2014-10-03 DIAGNOSIS — I4891 Unspecified atrial fibrillation: Secondary | ICD-10-CM | POA: Diagnosis not present

## 2014-10-03 LAB — PROTIME-INR: INR: 1.7 — AB (ref 0.9–1.1)

## 2014-10-05 ENCOUNTER — Ambulatory Visit (INDEPENDENT_AMBULATORY_CARE_PROVIDER_SITE_OTHER): Payer: Medicare Other | Admitting: Cardiovascular Disease

## 2014-10-05 DIAGNOSIS — I4891 Unspecified atrial fibrillation: Secondary | ICD-10-CM

## 2014-10-10 DIAGNOSIS — I4891 Unspecified atrial fibrillation: Secondary | ICD-10-CM | POA: Diagnosis not present

## 2014-10-10 LAB — PROTIME-INR: INR: 3 — AB (ref 0.9–1.1)

## 2014-10-11 ENCOUNTER — Ambulatory Visit (INDEPENDENT_AMBULATORY_CARE_PROVIDER_SITE_OTHER): Payer: Medicare Other | Admitting: Cardiovascular Disease

## 2014-10-11 DIAGNOSIS — Z5181 Encounter for therapeutic drug level monitoring: Secondary | ICD-10-CM | POA: Insufficient documentation

## 2014-10-11 DIAGNOSIS — I4891 Unspecified atrial fibrillation: Secondary | ICD-10-CM

## 2014-10-17 DIAGNOSIS — I4891 Unspecified atrial fibrillation: Secondary | ICD-10-CM | POA: Diagnosis not present

## 2014-10-18 ENCOUNTER — Ambulatory Visit (INDEPENDENT_AMBULATORY_CARE_PROVIDER_SITE_OTHER): Payer: Medicare Other | Admitting: Cardiology

## 2014-10-18 DIAGNOSIS — I4891 Unspecified atrial fibrillation: Secondary | ICD-10-CM

## 2014-10-18 DIAGNOSIS — Z5181 Encounter for therapeutic drug level monitoring: Secondary | ICD-10-CM

## 2014-10-18 LAB — PROTIME-INR: INR: 4.2 — AB (ref 0.9–1.1)

## 2014-10-23 DIAGNOSIS — N186 End stage renal disease: Secondary | ICD-10-CM | POA: Diagnosis not present

## 2014-10-23 DIAGNOSIS — I871 Compression of vein: Secondary | ICD-10-CM | POA: Diagnosis not present

## 2014-10-23 DIAGNOSIS — Z992 Dependence on renal dialysis: Secondary | ICD-10-CM | POA: Diagnosis not present

## 2014-10-23 DIAGNOSIS — T82858D Stenosis of vascular prosthetic devices, implants and grafts, subsequent encounter: Secondary | ICD-10-CM | POA: Diagnosis not present

## 2014-10-24 DIAGNOSIS — I4891 Unspecified atrial fibrillation: Secondary | ICD-10-CM | POA: Diagnosis not present

## 2014-10-24 LAB — PROTIME-INR: INR: 2.8 — AB (ref 0.9–1.1)

## 2014-10-26 ENCOUNTER — Ambulatory Visit (INDEPENDENT_AMBULATORY_CARE_PROVIDER_SITE_OTHER): Payer: Medicare Other | Admitting: Cardiology

## 2014-10-26 DIAGNOSIS — I4891 Unspecified atrial fibrillation: Secondary | ICD-10-CM

## 2014-10-26 DIAGNOSIS — Z5181 Encounter for therapeutic drug level monitoring: Secondary | ICD-10-CM

## 2014-10-27 DIAGNOSIS — Q612 Polycystic kidney, adult type: Secondary | ICD-10-CM | POA: Diagnosis not present

## 2014-10-27 DIAGNOSIS — N186 End stage renal disease: Secondary | ICD-10-CM | POA: Diagnosis not present

## 2014-10-27 DIAGNOSIS — Z992 Dependence on renal dialysis: Secondary | ICD-10-CM | POA: Diagnosis not present

## 2014-10-29 DIAGNOSIS — N186 End stage renal disease: Secondary | ICD-10-CM | POA: Diagnosis not present

## 2014-10-29 DIAGNOSIS — N2581 Secondary hyperparathyroidism of renal origin: Secondary | ICD-10-CM | POA: Diagnosis not present

## 2014-10-31 DIAGNOSIS — I4891 Unspecified atrial fibrillation: Secondary | ICD-10-CM | POA: Diagnosis not present

## 2014-10-31 LAB — PROTIME-INR: INR: 2.7 — AB (ref 0.9–1.1)

## 2014-11-01 ENCOUNTER — Ambulatory Visit (INDEPENDENT_AMBULATORY_CARE_PROVIDER_SITE_OTHER): Payer: Medicare Other | Admitting: Cardiovascular Disease

## 2014-11-01 DIAGNOSIS — Z5181 Encounter for therapeutic drug level monitoring: Secondary | ICD-10-CM

## 2014-11-01 DIAGNOSIS — I4891 Unspecified atrial fibrillation: Secondary | ICD-10-CM

## 2014-11-09 DIAGNOSIS — I4891 Unspecified atrial fibrillation: Secondary | ICD-10-CM | POA: Diagnosis not present

## 2014-11-14 DIAGNOSIS — I4891 Unspecified atrial fibrillation: Secondary | ICD-10-CM | POA: Diagnosis not present

## 2014-11-14 LAB — PROTIME-INR: INR: 3.1 — AB (ref 0.9–1.1)

## 2014-11-15 ENCOUNTER — Telehealth: Payer: Self-pay

## 2014-11-15 ENCOUNTER — Ambulatory Visit (INDEPENDENT_AMBULATORY_CARE_PROVIDER_SITE_OTHER): Payer: Medicare Other | Admitting: Cardiology

## 2014-11-15 DIAGNOSIS — I4891 Unspecified atrial fibrillation: Secondary | ICD-10-CM

## 2014-11-15 DIAGNOSIS — Z5181 Encounter for therapeutic drug level monitoring: Secondary | ICD-10-CM

## 2014-11-15 MED ORDER — WARFARIN SODIUM 5 MG PO TABS: ORAL_TABLET | ORAL | Status: DC

## 2014-11-15 NOTE — Telephone Encounter (Signed)
Warfarin refill sent to pharmacy as requested. 

## 2014-11-16 ENCOUNTER — Other Ambulatory Visit: Payer: Self-pay | Admitting: Pharmacist Clinician (PhC)/ Clinical Pharmacy Specialist

## 2014-11-16 MED ORDER — WARFARIN SODIUM 5 MG PO TABS: ORAL_TABLET | ORAL | Status: DC

## 2014-11-21 DIAGNOSIS — I4891 Unspecified atrial fibrillation: Secondary | ICD-10-CM | POA: Diagnosis not present

## 2014-11-27 DIAGNOSIS — N186 End stage renal disease: Secondary | ICD-10-CM | POA: Diagnosis not present

## 2014-11-27 DIAGNOSIS — Z992 Dependence on renal dialysis: Secondary | ICD-10-CM | POA: Diagnosis not present

## 2014-11-27 DIAGNOSIS — Q612 Polycystic kidney, adult type: Secondary | ICD-10-CM | POA: Diagnosis not present

## 2014-11-29 DIAGNOSIS — I4891 Unspecified atrial fibrillation: Secondary | ICD-10-CM | POA: Diagnosis not present

## 2014-11-29 DIAGNOSIS — N186 End stage renal disease: Secondary | ICD-10-CM | POA: Diagnosis not present

## 2014-11-29 DIAGNOSIS — N2581 Secondary hyperparathyroidism of renal origin: Secondary | ICD-10-CM | POA: Diagnosis not present

## 2014-11-30 ENCOUNTER — Ambulatory Visit (INDEPENDENT_AMBULATORY_CARE_PROVIDER_SITE_OTHER): Payer: Medicare Other | Admitting: Cardiology

## 2014-11-30 DIAGNOSIS — N2581 Secondary hyperparathyroidism of renal origin: Secondary | ICD-10-CM | POA: Diagnosis not present

## 2014-11-30 DIAGNOSIS — Z5181 Encounter for therapeutic drug level monitoring: Secondary | ICD-10-CM

## 2014-11-30 DIAGNOSIS — N186 End stage renal disease: Secondary | ICD-10-CM | POA: Diagnosis not present

## 2014-11-30 DIAGNOSIS — I4891 Unspecified atrial fibrillation: Secondary | ICD-10-CM

## 2014-11-30 LAB — PROTIME-INR: INR: 2.6 — AB (ref 0.9–1.1)

## 2014-12-03 DIAGNOSIS — N2581 Secondary hyperparathyroidism of renal origin: Secondary | ICD-10-CM | POA: Diagnosis not present

## 2014-12-03 DIAGNOSIS — N186 End stage renal disease: Secondary | ICD-10-CM | POA: Diagnosis not present

## 2014-12-05 DIAGNOSIS — N2581 Secondary hyperparathyroidism of renal origin: Secondary | ICD-10-CM | POA: Diagnosis not present

## 2014-12-05 DIAGNOSIS — N186 End stage renal disease: Secondary | ICD-10-CM | POA: Diagnosis not present

## 2014-12-07 DIAGNOSIS — N2581 Secondary hyperparathyroidism of renal origin: Secondary | ICD-10-CM | POA: Diagnosis not present

## 2014-12-07 DIAGNOSIS — N186 End stage renal disease: Secondary | ICD-10-CM | POA: Diagnosis not present

## 2014-12-10 DIAGNOSIS — N186 End stage renal disease: Secondary | ICD-10-CM | POA: Diagnosis not present

## 2014-12-10 DIAGNOSIS — N2581 Secondary hyperparathyroidism of renal origin: Secondary | ICD-10-CM | POA: Diagnosis not present

## 2014-12-12 DIAGNOSIS — N186 End stage renal disease: Secondary | ICD-10-CM | POA: Diagnosis not present

## 2014-12-12 DIAGNOSIS — N2581 Secondary hyperparathyroidism of renal origin: Secondary | ICD-10-CM | POA: Diagnosis not present

## 2014-12-14 DIAGNOSIS — N186 End stage renal disease: Secondary | ICD-10-CM | POA: Diagnosis not present

## 2014-12-14 DIAGNOSIS — N2581 Secondary hyperparathyroidism of renal origin: Secondary | ICD-10-CM | POA: Diagnosis not present

## 2014-12-17 DIAGNOSIS — N186 End stage renal disease: Secondary | ICD-10-CM | POA: Diagnosis not present

## 2014-12-17 DIAGNOSIS — N2581 Secondary hyperparathyroidism of renal origin: Secondary | ICD-10-CM | POA: Diagnosis not present

## 2014-12-21 DIAGNOSIS — I4891 Unspecified atrial fibrillation: Secondary | ICD-10-CM | POA: Diagnosis not present

## 2014-12-21 DIAGNOSIS — N2581 Secondary hyperparathyroidism of renal origin: Secondary | ICD-10-CM | POA: Diagnosis not present

## 2014-12-21 DIAGNOSIS — N186 End stage renal disease: Secondary | ICD-10-CM | POA: Diagnosis not present

## 2014-12-21 LAB — PROTIME-INR: INR: 2.4 — AB (ref 0.9–1.1)

## 2014-12-24 ENCOUNTER — Ambulatory Visit (INDEPENDENT_AMBULATORY_CARE_PROVIDER_SITE_OTHER): Payer: Medicare Other | Admitting: Interventional Cardiology

## 2014-12-24 DIAGNOSIS — Z5181 Encounter for therapeutic drug level monitoring: Secondary | ICD-10-CM

## 2014-12-24 DIAGNOSIS — N2581 Secondary hyperparathyroidism of renal origin: Secondary | ICD-10-CM | POA: Diagnosis not present

## 2014-12-24 DIAGNOSIS — I4891 Unspecified atrial fibrillation: Secondary | ICD-10-CM

## 2014-12-24 DIAGNOSIS — N186 End stage renal disease: Secondary | ICD-10-CM | POA: Diagnosis not present

## 2014-12-26 DIAGNOSIS — N2581 Secondary hyperparathyroidism of renal origin: Secondary | ICD-10-CM | POA: Diagnosis not present

## 2014-12-26 DIAGNOSIS — N186 End stage renal disease: Secondary | ICD-10-CM | POA: Diagnosis not present

## 2014-12-27 DIAGNOSIS — Q612 Polycystic kidney, adult type: Secondary | ICD-10-CM | POA: Diagnosis not present

## 2014-12-27 DIAGNOSIS — N186 End stage renal disease: Secondary | ICD-10-CM | POA: Diagnosis not present

## 2014-12-27 DIAGNOSIS — Z992 Dependence on renal dialysis: Secondary | ICD-10-CM | POA: Diagnosis not present

## 2014-12-28 DIAGNOSIS — N186 End stage renal disease: Secondary | ICD-10-CM | POA: Diagnosis not present

## 2014-12-28 DIAGNOSIS — N2581 Secondary hyperparathyroidism of renal origin: Secondary | ICD-10-CM | POA: Diagnosis not present

## 2015-01-21 LAB — PROTIME-INR: INR: 1.8 — AB (ref <=1.1)

## 2015-01-23 DIAGNOSIS — I4891 Unspecified atrial fibrillation: Secondary | ICD-10-CM | POA: Diagnosis not present

## 2015-01-24 ENCOUNTER — Ambulatory Visit (INDEPENDENT_AMBULATORY_CARE_PROVIDER_SITE_OTHER): Payer: Medicare Other | Admitting: Cardiovascular Disease

## 2015-01-24 DIAGNOSIS — Z5181 Encounter for therapeutic drug level monitoring: Secondary | ICD-10-CM

## 2015-01-24 DIAGNOSIS — I4891 Unspecified atrial fibrillation: Secondary | ICD-10-CM

## 2015-01-27 DIAGNOSIS — N186 End stage renal disease: Secondary | ICD-10-CM | POA: Diagnosis not present

## 2015-01-27 DIAGNOSIS — Z992 Dependence on renal dialysis: Secondary | ICD-10-CM | POA: Diagnosis not present

## 2015-01-27 DIAGNOSIS — Q612 Polycystic kidney, adult type: Secondary | ICD-10-CM | POA: Diagnosis not present

## 2015-01-29 ENCOUNTER — Other Ambulatory Visit: Payer: Self-pay | Admitting: Cardiology

## 2015-01-29 DIAGNOSIS — N186 End stage renal disease: Secondary | ICD-10-CM | POA: Diagnosis not present

## 2015-01-29 DIAGNOSIS — N2581 Secondary hyperparathyroidism of renal origin: Secondary | ICD-10-CM | POA: Diagnosis not present

## 2015-02-01 DIAGNOSIS — N186 End stage renal disease: Secondary | ICD-10-CM | POA: Diagnosis not present

## 2015-02-01 DIAGNOSIS — N2581 Secondary hyperparathyroidism of renal origin: Secondary | ICD-10-CM | POA: Diagnosis not present

## 2015-02-06 DIAGNOSIS — N186 End stage renal disease: Secondary | ICD-10-CM | POA: Diagnosis not present

## 2015-02-06 DIAGNOSIS — N2581 Secondary hyperparathyroidism of renal origin: Secondary | ICD-10-CM | POA: Diagnosis not present

## 2015-02-08 DIAGNOSIS — N186 End stage renal disease: Secondary | ICD-10-CM | POA: Diagnosis not present

## 2015-02-08 DIAGNOSIS — N2581 Secondary hyperparathyroidism of renal origin: Secondary | ICD-10-CM | POA: Diagnosis not present

## 2015-02-11 DIAGNOSIS — N186 End stage renal disease: Secondary | ICD-10-CM | POA: Diagnosis not present

## 2015-02-11 DIAGNOSIS — N2581 Secondary hyperparathyroidism of renal origin: Secondary | ICD-10-CM | POA: Diagnosis not present

## 2015-02-15 DIAGNOSIS — N186 End stage renal disease: Secondary | ICD-10-CM | POA: Diagnosis not present

## 2015-02-15 DIAGNOSIS — N2581 Secondary hyperparathyroidism of renal origin: Secondary | ICD-10-CM | POA: Diagnosis not present

## 2015-02-18 DIAGNOSIS — N2581 Secondary hyperparathyroidism of renal origin: Secondary | ICD-10-CM | POA: Diagnosis not present

## 2015-02-18 DIAGNOSIS — N186 End stage renal disease: Secondary | ICD-10-CM | POA: Diagnosis not present

## 2015-02-18 LAB — PROTIME-INR: INR: 3.4 — AB (ref 0.9–1.1)

## 2015-02-19 ENCOUNTER — Ambulatory Visit (INDEPENDENT_AMBULATORY_CARE_PROVIDER_SITE_OTHER): Payer: Medicare Other | Admitting: Pharmacist

## 2015-02-19 DIAGNOSIS — Z5181 Encounter for therapeutic drug level monitoring: Secondary | ICD-10-CM

## 2015-02-19 DIAGNOSIS — I4891 Unspecified atrial fibrillation: Secondary | ICD-10-CM

## 2015-02-22 DIAGNOSIS — N186 End stage renal disease: Secondary | ICD-10-CM | POA: Diagnosis not present

## 2015-02-22 DIAGNOSIS — N2581 Secondary hyperparathyroidism of renal origin: Secondary | ICD-10-CM | POA: Diagnosis not present

## 2015-02-22 DIAGNOSIS — I4891 Unspecified atrial fibrillation: Secondary | ICD-10-CM | POA: Diagnosis not present

## 2015-02-25 DIAGNOSIS — N2581 Secondary hyperparathyroidism of renal origin: Secondary | ICD-10-CM | POA: Diagnosis not present

## 2015-02-25 DIAGNOSIS — N186 End stage renal disease: Secondary | ICD-10-CM | POA: Diagnosis not present

## 2015-02-27 DIAGNOSIS — Q612 Polycystic kidney, adult type: Secondary | ICD-10-CM | POA: Diagnosis not present

## 2015-02-27 DIAGNOSIS — Z992 Dependence on renal dialysis: Secondary | ICD-10-CM | POA: Diagnosis not present

## 2015-02-27 DIAGNOSIS — N186 End stage renal disease: Secondary | ICD-10-CM | POA: Diagnosis not present

## 2015-03-01 DIAGNOSIS — N186 End stage renal disease: Secondary | ICD-10-CM | POA: Diagnosis not present

## 2015-03-01 DIAGNOSIS — N2581 Secondary hyperparathyroidism of renal origin: Secondary | ICD-10-CM | POA: Diagnosis not present

## 2015-03-01 DIAGNOSIS — Z23 Encounter for immunization: Secondary | ICD-10-CM | POA: Diagnosis not present

## 2015-03-11 LAB — PROTIME-INR: INR: 2.6 — AB (ref 0.9–1.1)

## 2015-03-12 ENCOUNTER — Ambulatory Visit (INDEPENDENT_AMBULATORY_CARE_PROVIDER_SITE_OTHER): Payer: Medicare Other | Admitting: Internal Medicine

## 2015-03-12 DIAGNOSIS — I4891 Unspecified atrial fibrillation: Secondary | ICD-10-CM

## 2015-03-12 DIAGNOSIS — Z5181 Encounter for therapeutic drug level monitoring: Secondary | ICD-10-CM

## 2015-03-29 DIAGNOSIS — N186 End stage renal disease: Secondary | ICD-10-CM | POA: Diagnosis not present

## 2015-03-29 DIAGNOSIS — I4891 Unspecified atrial fibrillation: Secondary | ICD-10-CM | POA: Diagnosis not present

## 2015-03-29 DIAGNOSIS — Q612 Polycystic kidney, adult type: Secondary | ICD-10-CM | POA: Diagnosis not present

## 2015-03-29 DIAGNOSIS — Z992 Dependence on renal dialysis: Secondary | ICD-10-CM | POA: Diagnosis not present

## 2015-03-29 LAB — PROTIME-INR: INR: 2.4 — AB (ref 0.9–1.1)

## 2015-04-01 ENCOUNTER — Ambulatory Visit (INDEPENDENT_AMBULATORY_CARE_PROVIDER_SITE_OTHER): Payer: Medicare Other | Admitting: Cardiology

## 2015-04-01 DIAGNOSIS — I4891 Unspecified atrial fibrillation: Secondary | ICD-10-CM

## 2015-04-01 DIAGNOSIS — Z5181 Encounter for therapeutic drug level monitoring: Secondary | ICD-10-CM

## 2015-04-02 DIAGNOSIS — N2581 Secondary hyperparathyroidism of renal origin: Secondary | ICD-10-CM | POA: Diagnosis not present

## 2015-04-02 DIAGNOSIS — E875 Hyperkalemia: Secondary | ICD-10-CM | POA: Diagnosis not present

## 2015-04-02 DIAGNOSIS — D509 Iron deficiency anemia, unspecified: Secondary | ICD-10-CM | POA: Diagnosis not present

## 2015-04-02 DIAGNOSIS — D631 Anemia in chronic kidney disease: Secondary | ICD-10-CM | POA: Diagnosis not present

## 2015-04-02 DIAGNOSIS — Z23 Encounter for immunization: Secondary | ICD-10-CM | POA: Diagnosis not present

## 2015-04-02 DIAGNOSIS — N186 End stage renal disease: Secondary | ICD-10-CM | POA: Diagnosis not present

## 2015-04-04 DIAGNOSIS — N186 End stage renal disease: Secondary | ICD-10-CM | POA: Diagnosis not present

## 2015-04-04 DIAGNOSIS — D509 Iron deficiency anemia, unspecified: Secondary | ICD-10-CM | POA: Diagnosis not present

## 2015-04-04 DIAGNOSIS — D631 Anemia in chronic kidney disease: Secondary | ICD-10-CM | POA: Diagnosis not present

## 2015-04-04 DIAGNOSIS — E875 Hyperkalemia: Secondary | ICD-10-CM | POA: Diagnosis not present

## 2015-04-04 DIAGNOSIS — N2581 Secondary hyperparathyroidism of renal origin: Secondary | ICD-10-CM | POA: Diagnosis not present

## 2015-04-04 DIAGNOSIS — Z23 Encounter for immunization: Secondary | ICD-10-CM | POA: Diagnosis not present

## 2015-04-06 DIAGNOSIS — D509 Iron deficiency anemia, unspecified: Secondary | ICD-10-CM | POA: Diagnosis not present

## 2015-04-06 DIAGNOSIS — N186 End stage renal disease: Secondary | ICD-10-CM | POA: Diagnosis not present

## 2015-04-06 DIAGNOSIS — E875 Hyperkalemia: Secondary | ICD-10-CM | POA: Diagnosis not present

## 2015-04-06 DIAGNOSIS — D631 Anemia in chronic kidney disease: Secondary | ICD-10-CM | POA: Diagnosis not present

## 2015-04-06 DIAGNOSIS — N2581 Secondary hyperparathyroidism of renal origin: Secondary | ICD-10-CM | POA: Diagnosis not present

## 2015-04-06 DIAGNOSIS — Z23 Encounter for immunization: Secondary | ICD-10-CM | POA: Diagnosis not present

## 2015-04-08 DIAGNOSIS — D509 Iron deficiency anemia, unspecified: Secondary | ICD-10-CM | POA: Diagnosis not present

## 2015-04-08 DIAGNOSIS — Z23 Encounter for immunization: Secondary | ICD-10-CM | POA: Diagnosis not present

## 2015-04-08 DIAGNOSIS — E875 Hyperkalemia: Secondary | ICD-10-CM | POA: Diagnosis not present

## 2015-04-08 DIAGNOSIS — D631 Anemia in chronic kidney disease: Secondary | ICD-10-CM | POA: Diagnosis not present

## 2015-04-08 DIAGNOSIS — N186 End stage renal disease: Secondary | ICD-10-CM | POA: Diagnosis not present

## 2015-04-08 DIAGNOSIS — N2581 Secondary hyperparathyroidism of renal origin: Secondary | ICD-10-CM | POA: Diagnosis not present

## 2015-04-11 DIAGNOSIS — N186 End stage renal disease: Secondary | ICD-10-CM | POA: Diagnosis not present

## 2015-04-11 DIAGNOSIS — D509 Iron deficiency anemia, unspecified: Secondary | ICD-10-CM | POA: Diagnosis not present

## 2015-04-11 DIAGNOSIS — E875 Hyperkalemia: Secondary | ICD-10-CM | POA: Diagnosis not present

## 2015-04-11 DIAGNOSIS — D631 Anemia in chronic kidney disease: Secondary | ICD-10-CM | POA: Diagnosis not present

## 2015-04-11 DIAGNOSIS — N2581 Secondary hyperparathyroidism of renal origin: Secondary | ICD-10-CM | POA: Diagnosis not present

## 2015-04-11 DIAGNOSIS — I4891 Unspecified atrial fibrillation: Secondary | ICD-10-CM | POA: Diagnosis not present

## 2015-04-11 DIAGNOSIS — Z23 Encounter for immunization: Secondary | ICD-10-CM | POA: Diagnosis not present

## 2015-04-11 LAB — PROTIME-INR: INR: 3.8 — AB (ref 0.9–1.1)

## 2015-04-12 ENCOUNTER — Ambulatory Visit (INDEPENDENT_AMBULATORY_CARE_PROVIDER_SITE_OTHER): Payer: Medicare Other | Admitting: Pharmacist

## 2015-04-12 DIAGNOSIS — E875 Hyperkalemia: Secondary | ICD-10-CM | POA: Diagnosis not present

## 2015-04-12 DIAGNOSIS — I4891 Unspecified atrial fibrillation: Secondary | ICD-10-CM

## 2015-04-12 DIAGNOSIS — D631 Anemia in chronic kidney disease: Secondary | ICD-10-CM | POA: Diagnosis not present

## 2015-04-12 DIAGNOSIS — D509 Iron deficiency anemia, unspecified: Secondary | ICD-10-CM | POA: Diagnosis not present

## 2015-04-12 DIAGNOSIS — Z23 Encounter for immunization: Secondary | ICD-10-CM | POA: Diagnosis not present

## 2015-04-12 DIAGNOSIS — N186 End stage renal disease: Secondary | ICD-10-CM | POA: Diagnosis not present

## 2015-04-12 DIAGNOSIS — N2581 Secondary hyperparathyroidism of renal origin: Secondary | ICD-10-CM | POA: Diagnosis not present

## 2015-04-12 DIAGNOSIS — Z5181 Encounter for therapeutic drug level monitoring: Secondary | ICD-10-CM

## 2015-04-16 DIAGNOSIS — D631 Anemia in chronic kidney disease: Secondary | ICD-10-CM | POA: Diagnosis not present

## 2015-04-16 DIAGNOSIS — Z23 Encounter for immunization: Secondary | ICD-10-CM | POA: Diagnosis not present

## 2015-04-16 DIAGNOSIS — D509 Iron deficiency anemia, unspecified: Secondary | ICD-10-CM | POA: Diagnosis not present

## 2015-04-16 DIAGNOSIS — N186 End stage renal disease: Secondary | ICD-10-CM | POA: Diagnosis not present

## 2015-04-16 DIAGNOSIS — E875 Hyperkalemia: Secondary | ICD-10-CM | POA: Diagnosis not present

## 2015-04-16 DIAGNOSIS — N2581 Secondary hyperparathyroidism of renal origin: Secondary | ICD-10-CM | POA: Diagnosis not present

## 2015-04-17 DIAGNOSIS — N2581 Secondary hyperparathyroidism of renal origin: Secondary | ICD-10-CM | POA: Diagnosis not present

## 2015-04-17 DIAGNOSIS — N186 End stage renal disease: Secondary | ICD-10-CM | POA: Diagnosis not present

## 2015-04-17 DIAGNOSIS — D509 Iron deficiency anemia, unspecified: Secondary | ICD-10-CM | POA: Diagnosis not present

## 2015-04-17 DIAGNOSIS — D631 Anemia in chronic kidney disease: Secondary | ICD-10-CM | POA: Diagnosis not present

## 2015-04-17 DIAGNOSIS — Z23 Encounter for immunization: Secondary | ICD-10-CM | POA: Diagnosis not present

## 2015-04-17 DIAGNOSIS — E875 Hyperkalemia: Secondary | ICD-10-CM | POA: Diagnosis not present

## 2015-04-19 DIAGNOSIS — D509 Iron deficiency anemia, unspecified: Secondary | ICD-10-CM | POA: Diagnosis not present

## 2015-04-19 DIAGNOSIS — D631 Anemia in chronic kidney disease: Secondary | ICD-10-CM | POA: Diagnosis not present

## 2015-04-19 DIAGNOSIS — N186 End stage renal disease: Secondary | ICD-10-CM | POA: Diagnosis not present

## 2015-04-19 DIAGNOSIS — N2581 Secondary hyperparathyroidism of renal origin: Secondary | ICD-10-CM | POA: Diagnosis not present

## 2015-04-19 DIAGNOSIS — Z23 Encounter for immunization: Secondary | ICD-10-CM | POA: Diagnosis not present

## 2015-04-19 DIAGNOSIS — E875 Hyperkalemia: Secondary | ICD-10-CM | POA: Diagnosis not present

## 2015-04-23 DIAGNOSIS — D631 Anemia in chronic kidney disease: Secondary | ICD-10-CM | POA: Diagnosis not present

## 2015-04-23 DIAGNOSIS — Z23 Encounter for immunization: Secondary | ICD-10-CM | POA: Diagnosis not present

## 2015-04-23 DIAGNOSIS — N186 End stage renal disease: Secondary | ICD-10-CM | POA: Diagnosis not present

## 2015-04-23 DIAGNOSIS — D509 Iron deficiency anemia, unspecified: Secondary | ICD-10-CM | POA: Diagnosis not present

## 2015-04-23 DIAGNOSIS — E875 Hyperkalemia: Secondary | ICD-10-CM | POA: Diagnosis not present

## 2015-04-23 DIAGNOSIS — N2581 Secondary hyperparathyroidism of renal origin: Secondary | ICD-10-CM | POA: Diagnosis not present

## 2015-04-26 DIAGNOSIS — I4891 Unspecified atrial fibrillation: Secondary | ICD-10-CM | POA: Diagnosis not present

## 2015-04-26 DIAGNOSIS — Z23 Encounter for immunization: Secondary | ICD-10-CM | POA: Diagnosis not present

## 2015-04-26 DIAGNOSIS — D509 Iron deficiency anemia, unspecified: Secondary | ICD-10-CM | POA: Diagnosis not present

## 2015-04-26 DIAGNOSIS — N2581 Secondary hyperparathyroidism of renal origin: Secondary | ICD-10-CM | POA: Diagnosis not present

## 2015-04-26 DIAGNOSIS — D631 Anemia in chronic kidney disease: Secondary | ICD-10-CM | POA: Diagnosis not present

## 2015-04-26 DIAGNOSIS — E875 Hyperkalemia: Secondary | ICD-10-CM | POA: Diagnosis not present

## 2015-04-26 DIAGNOSIS — N186 End stage renal disease: Secondary | ICD-10-CM | POA: Diagnosis not present

## 2015-04-26 LAB — PROTIME-INR: INR: 3.5 — AB (ref 0.9–1.1)

## 2015-04-29 ENCOUNTER — Ambulatory Visit (INDEPENDENT_AMBULATORY_CARE_PROVIDER_SITE_OTHER): Payer: Medicare Other | Admitting: Cardiology

## 2015-04-29 DIAGNOSIS — N186 End stage renal disease: Secondary | ICD-10-CM | POA: Diagnosis not present

## 2015-04-29 DIAGNOSIS — D509 Iron deficiency anemia, unspecified: Secondary | ICD-10-CM | POA: Diagnosis not present

## 2015-04-29 DIAGNOSIS — D631 Anemia in chronic kidney disease: Secondary | ICD-10-CM | POA: Diagnosis not present

## 2015-04-29 DIAGNOSIS — Z992 Dependence on renal dialysis: Secondary | ICD-10-CM | POA: Diagnosis not present

## 2015-04-29 DIAGNOSIS — I4891 Unspecified atrial fibrillation: Secondary | ICD-10-CM

## 2015-04-29 DIAGNOSIS — Z5181 Encounter for therapeutic drug level monitoring: Secondary | ICD-10-CM

## 2015-04-29 DIAGNOSIS — E875 Hyperkalemia: Secondary | ICD-10-CM | POA: Diagnosis not present

## 2015-04-29 DIAGNOSIS — Z23 Encounter for immunization: Secondary | ICD-10-CM | POA: Diagnosis not present

## 2015-04-29 DIAGNOSIS — Q612 Polycystic kidney, adult type: Secondary | ICD-10-CM | POA: Diagnosis not present

## 2015-04-29 DIAGNOSIS — N2581 Secondary hyperparathyroidism of renal origin: Secondary | ICD-10-CM | POA: Diagnosis not present

## 2015-05-03 DIAGNOSIS — N186 End stage renal disease: Secondary | ICD-10-CM | POA: Diagnosis not present

## 2015-05-03 DIAGNOSIS — N2581 Secondary hyperparathyroidism of renal origin: Secondary | ICD-10-CM | POA: Diagnosis not present

## 2015-05-03 DIAGNOSIS — D631 Anemia in chronic kidney disease: Secondary | ICD-10-CM | POA: Diagnosis not present

## 2015-05-03 DIAGNOSIS — D509 Iron deficiency anemia, unspecified: Secondary | ICD-10-CM | POA: Diagnosis not present

## 2015-05-06 ENCOUNTER — Other Ambulatory Visit: Payer: Self-pay | Admitting: Cardiology

## 2015-05-06 DIAGNOSIS — N2581 Secondary hyperparathyroidism of renal origin: Secondary | ICD-10-CM | POA: Diagnosis not present

## 2015-05-06 DIAGNOSIS — N186 End stage renal disease: Secondary | ICD-10-CM | POA: Diagnosis not present

## 2015-05-06 DIAGNOSIS — D509 Iron deficiency anemia, unspecified: Secondary | ICD-10-CM | POA: Diagnosis not present

## 2015-05-06 DIAGNOSIS — D631 Anemia in chronic kidney disease: Secondary | ICD-10-CM | POA: Diagnosis not present

## 2015-05-06 MED ORDER — DIGOXIN 125 MCG PO TABS: 125.0000 ug | ORAL_TABLET | ORAL | Status: DC

## 2015-05-10 DIAGNOSIS — D509 Iron deficiency anemia, unspecified: Secondary | ICD-10-CM | POA: Diagnosis not present

## 2015-05-10 DIAGNOSIS — D631 Anemia in chronic kidney disease: Secondary | ICD-10-CM | POA: Diagnosis not present

## 2015-05-10 DIAGNOSIS — N186 End stage renal disease: Secondary | ICD-10-CM | POA: Diagnosis not present

## 2015-05-10 DIAGNOSIS — N2581 Secondary hyperparathyroidism of renal origin: Secondary | ICD-10-CM | POA: Diagnosis not present

## 2015-05-13 DIAGNOSIS — D509 Iron deficiency anemia, unspecified: Secondary | ICD-10-CM | POA: Diagnosis not present

## 2015-05-13 DIAGNOSIS — N186 End stage renal disease: Secondary | ICD-10-CM | POA: Diagnosis not present

## 2015-05-13 DIAGNOSIS — D631 Anemia in chronic kidney disease: Secondary | ICD-10-CM | POA: Diagnosis not present

## 2015-05-13 DIAGNOSIS — N2581 Secondary hyperparathyroidism of renal origin: Secondary | ICD-10-CM | POA: Diagnosis not present

## 2015-05-17 DIAGNOSIS — D509 Iron deficiency anemia, unspecified: Secondary | ICD-10-CM | POA: Diagnosis not present

## 2015-05-17 DIAGNOSIS — D631 Anemia in chronic kidney disease: Secondary | ICD-10-CM | POA: Diagnosis not present

## 2015-05-17 DIAGNOSIS — N2581 Secondary hyperparathyroidism of renal origin: Secondary | ICD-10-CM | POA: Diagnosis not present

## 2015-05-17 DIAGNOSIS — N186 End stage renal disease: Secondary | ICD-10-CM | POA: Diagnosis not present

## 2015-05-20 DIAGNOSIS — D509 Iron deficiency anemia, unspecified: Secondary | ICD-10-CM | POA: Diagnosis not present

## 2015-05-20 DIAGNOSIS — N186 End stage renal disease: Secondary | ICD-10-CM | POA: Diagnosis not present

## 2015-05-20 DIAGNOSIS — N2581 Secondary hyperparathyroidism of renal origin: Secondary | ICD-10-CM | POA: Diagnosis not present

## 2015-05-20 DIAGNOSIS — D631 Anemia in chronic kidney disease: Secondary | ICD-10-CM | POA: Diagnosis not present

## 2015-05-22 ENCOUNTER — Telehealth: Payer: Self-pay | Admitting: *Deleted

## 2015-05-22 DIAGNOSIS — N186 End stage renal disease: Secondary | ICD-10-CM | POA: Diagnosis not present

## 2015-05-22 DIAGNOSIS — D631 Anemia in chronic kidney disease: Secondary | ICD-10-CM | POA: Diagnosis not present

## 2015-05-22 DIAGNOSIS — N2581 Secondary hyperparathyroidism of renal origin: Secondary | ICD-10-CM | POA: Diagnosis not present

## 2015-05-22 DIAGNOSIS — I4891 Unspecified atrial fibrillation: Secondary | ICD-10-CM | POA: Diagnosis not present

## 2015-05-22 DIAGNOSIS — D509 Iron deficiency anemia, unspecified: Secondary | ICD-10-CM | POA: Diagnosis not present

## 2015-05-22 LAB — PROTIME-INR: INR: 3.3 — AB (ref 0.9–1.1)

## 2015-05-22 NOTE — Telephone Encounter (Signed)
Called [facility] & spoke with [place] for overdue INR results on the patient and she stated that they have not obtained any INR results since 04/26/15.  [facility] faxed an order to [facility] on 04/26/15 to obtain an INR on 05/13/15 but states it has not been received in there system.  [place] with [facility] stated she would obtain an INR result today and fax results to [facility] once resulted.  Stressed the importance of obtaining an INR and she stated she would assure they got one today if the patient has not been connected to the HD machine.

## 2015-05-27 ENCOUNTER — Ambulatory Visit (INDEPENDENT_AMBULATORY_CARE_PROVIDER_SITE_OTHER): Payer: Medicare Other | Admitting: Internal Medicine

## 2015-05-27 DIAGNOSIS — N2581 Secondary hyperparathyroidism of renal origin: Secondary | ICD-10-CM | POA: Diagnosis not present

## 2015-05-27 DIAGNOSIS — D631 Anemia in chronic kidney disease: Secondary | ICD-10-CM | POA: Diagnosis not present

## 2015-05-27 DIAGNOSIS — I4891 Unspecified atrial fibrillation: Secondary | ICD-10-CM

## 2015-05-27 DIAGNOSIS — N186 End stage renal disease: Secondary | ICD-10-CM | POA: Diagnosis not present

## 2015-05-27 DIAGNOSIS — D689 Coagulation defect, unspecified: Secondary | ICD-10-CM | POA: Diagnosis not present

## 2015-05-27 DIAGNOSIS — D509 Iron deficiency anemia, unspecified: Secondary | ICD-10-CM | POA: Diagnosis not present

## 2015-05-27 DIAGNOSIS — Z5181 Encounter for therapeutic drug level monitoring: Secondary | ICD-10-CM

## 2015-05-29 DIAGNOSIS — N186 End stage renal disease: Secondary | ICD-10-CM | POA: Diagnosis not present

## 2015-05-29 DIAGNOSIS — Z992 Dependence on renal dialysis: Secondary | ICD-10-CM | POA: Diagnosis not present

## 2015-05-29 DIAGNOSIS — Q612 Polycystic kidney, adult type: Secondary | ICD-10-CM | POA: Diagnosis not present

## 2015-05-31 DIAGNOSIS — N2581 Secondary hyperparathyroidism of renal origin: Secondary | ICD-10-CM | POA: Diagnosis not present

## 2015-05-31 DIAGNOSIS — N186 End stage renal disease: Secondary | ICD-10-CM | POA: Diagnosis not present

## 2015-06-05 DIAGNOSIS — N2581 Secondary hyperparathyroidism of renal origin: Secondary | ICD-10-CM | POA: Diagnosis not present

## 2015-06-05 DIAGNOSIS — N186 End stage renal disease: Secondary | ICD-10-CM | POA: Diagnosis not present

## 2015-06-07 DIAGNOSIS — N2581 Secondary hyperparathyroidism of renal origin: Secondary | ICD-10-CM | POA: Diagnosis not present

## 2015-06-07 DIAGNOSIS — N186 End stage renal disease: Secondary | ICD-10-CM | POA: Diagnosis not present

## 2015-06-10 ENCOUNTER — Other Ambulatory Visit: Payer: Self-pay | Admitting: Cardiology

## 2015-06-10 MED ORDER — WARFARIN SODIUM 5 MG PO TABS: ORAL_TABLET | ORAL | Status: DC

## 2015-06-10 MED ORDER — DIGOXIN 125 MCG PO TABS: 125.0000 ug | ORAL_TABLET | ORAL | Status: DC

## 2015-06-10 NOTE — Telephone Encounter (Signed)
°*  STAT* If patient is at the pharmacy, call can be transferred to refill team.   1. Which medications need to be refilled? (please list name of each medication and dose if known) Digoxin 125mcg, Warfrain 5mg   2. Which pharmacy/location (including street and city if local pharmacy) is [facility] [place]   3. Do they need a 30 day or 90 day supply? 90

## 2015-06-10 NOTE — Telephone Encounter (Signed)
Rx send into pt pharmacy, pt is aware she need appt before anymore refills

## 2015-06-12 DIAGNOSIS — N2581 Secondary hyperparathyroidism of renal origin: Secondary | ICD-10-CM | POA: Diagnosis not present

## 2015-06-12 DIAGNOSIS — N186 End stage renal disease: Secondary | ICD-10-CM | POA: Diagnosis not present

## 2015-06-14 DIAGNOSIS — N186 End stage renal disease: Secondary | ICD-10-CM | POA: Diagnosis not present

## 2015-06-14 DIAGNOSIS — N2581 Secondary hyperparathyroidism of renal origin: Secondary | ICD-10-CM | POA: Diagnosis not present

## 2015-06-19 DIAGNOSIS — I4891 Unspecified atrial fibrillation: Secondary | ICD-10-CM | POA: Diagnosis not present

## 2015-06-19 DIAGNOSIS — N186 End stage renal disease: Secondary | ICD-10-CM | POA: Diagnosis not present

## 2015-06-19 DIAGNOSIS — N2581 Secondary hyperparathyroidism of renal origin: Secondary | ICD-10-CM | POA: Diagnosis not present

## 2015-06-19 LAB — PROTIME-INR: INR: 2.8 — AB (ref <=1.1)

## 2015-06-20 ENCOUNTER — Ambulatory Visit (INDEPENDENT_AMBULATORY_CARE_PROVIDER_SITE_OTHER): Payer: Medicare Other | Admitting: Cardiology

## 2015-06-20 DIAGNOSIS — Z5181 Encounter for therapeutic drug level monitoring: Secondary | ICD-10-CM

## 2015-06-20 DIAGNOSIS — I4891 Unspecified atrial fibrillation: Secondary | ICD-10-CM

## 2015-06-21 DIAGNOSIS — N2581 Secondary hyperparathyroidism of renal origin: Secondary | ICD-10-CM | POA: Diagnosis not present

## 2015-06-21 DIAGNOSIS — N186 End stage renal disease: Secondary | ICD-10-CM | POA: Diagnosis not present

## 2015-06-26 ENCOUNTER — Encounter: Payer: Self-pay | Admitting: Cardiology

## 2015-06-26 DIAGNOSIS — I4891 Unspecified atrial fibrillation: Secondary | ICD-10-CM | POA: Diagnosis not present

## 2015-06-26 DIAGNOSIS — N186 End stage renal disease: Secondary | ICD-10-CM | POA: Diagnosis not present

## 2015-06-26 DIAGNOSIS — N2581 Secondary hyperparathyroidism of renal origin: Secondary | ICD-10-CM | POA: Diagnosis not present

## 2015-06-27 ENCOUNTER — Ambulatory Visit (INDEPENDENT_AMBULATORY_CARE_PROVIDER_SITE_OTHER): Payer: Medicare Other | Admitting: Pharmacist

## 2015-06-27 DIAGNOSIS — I4891 Unspecified atrial fibrillation: Secondary | ICD-10-CM

## 2015-06-27 DIAGNOSIS — Z5181 Encounter for therapeutic drug level monitoring: Secondary | ICD-10-CM

## 2015-06-27 LAB — POCT INR: INR: 1.46

## 2015-06-29 DIAGNOSIS — Q612 Polycystic kidney, adult type: Secondary | ICD-10-CM | POA: Diagnosis not present

## 2015-06-29 DIAGNOSIS — Z992 Dependence on renal dialysis: Secondary | ICD-10-CM | POA: Diagnosis not present

## 2015-06-29 DIAGNOSIS — N186 End stage renal disease: Secondary | ICD-10-CM | POA: Diagnosis not present

## 2015-07-01 DIAGNOSIS — N2581 Secondary hyperparathyroidism of renal origin: Secondary | ICD-10-CM | POA: Diagnosis not present

## 2015-07-01 DIAGNOSIS — N186 End stage renal disease: Secondary | ICD-10-CM | POA: Diagnosis not present

## 2015-07-01 DIAGNOSIS — D509 Iron deficiency anemia, unspecified: Secondary | ICD-10-CM | POA: Diagnosis not present

## 2015-07-05 DIAGNOSIS — D509 Iron deficiency anemia, unspecified: Secondary | ICD-10-CM | POA: Diagnosis not present

## 2015-07-05 DIAGNOSIS — N186 End stage renal disease: Secondary | ICD-10-CM | POA: Diagnosis not present

## 2015-07-05 DIAGNOSIS — I4891 Unspecified atrial fibrillation: Secondary | ICD-10-CM | POA: Diagnosis not present

## 2015-07-05 DIAGNOSIS — N2581 Secondary hyperparathyroidism of renal origin: Secondary | ICD-10-CM | POA: Diagnosis not present

## 2015-07-05 LAB — POCT INR: INR: 1.93

## 2015-07-07 ENCOUNTER — Other Ambulatory Visit: Payer: Self-pay | Admitting: Cardiology

## 2015-07-09 ENCOUNTER — Other Ambulatory Visit: Payer: Self-pay | Admitting: *Deleted

## 2015-07-09 ENCOUNTER — Ambulatory Visit (INDEPENDENT_AMBULATORY_CARE_PROVIDER_SITE_OTHER): Payer: Medicare Other | Admitting: Cardiology

## 2015-07-09 DIAGNOSIS — Z5181 Encounter for therapeutic drug level monitoring: Secondary | ICD-10-CM

## 2015-07-09 DIAGNOSIS — I4891 Unspecified atrial fibrillation: Secondary | ICD-10-CM

## 2015-07-09 NOTE — Telephone Encounter (Signed)
Patient left a voicemail on the refill line requesting a ninety day refill of digoxin. She stated that she knows that she is due for an appointment and would like to see Dr Jens Somrenshaw in March.

## 2015-07-10 DIAGNOSIS — D509 Iron deficiency anemia, unspecified: Secondary | ICD-10-CM | POA: Diagnosis not present

## 2015-07-10 DIAGNOSIS — N2581 Secondary hyperparathyroidism of renal origin: Secondary | ICD-10-CM | POA: Diagnosis not present

## 2015-07-10 DIAGNOSIS — N186 End stage renal disease: Secondary | ICD-10-CM | POA: Diagnosis not present

## 2015-07-10 MED ORDER — DIGOXIN 125 MCG PO TABS
125.0000 ug | ORAL_TABLET | ORAL | Status: DC
Start: 2015-07-10 — End: 2015-08-31

## 2015-07-10 NOTE — Telephone Encounter (Signed)
Pt has appointment for 09/25/15 with Dr. Jens Somrenshaw. I explained to pt that I would refill medication up until pt's appointment. Pt verbalized understanding.

## 2015-07-12 DIAGNOSIS — N2581 Secondary hyperparathyroidism of renal origin: Secondary | ICD-10-CM | POA: Diagnosis not present

## 2015-07-12 DIAGNOSIS — D509 Iron deficiency anemia, unspecified: Secondary | ICD-10-CM | POA: Diagnosis not present

## 2015-07-12 DIAGNOSIS — N186 End stage renal disease: Secondary | ICD-10-CM | POA: Diagnosis not present

## 2015-07-15 DIAGNOSIS — D509 Iron deficiency anemia, unspecified: Secondary | ICD-10-CM | POA: Diagnosis not present

## 2015-07-15 DIAGNOSIS — N2581 Secondary hyperparathyroidism of renal origin: Secondary | ICD-10-CM | POA: Diagnosis not present

## 2015-07-15 DIAGNOSIS — N186 End stage renal disease: Secondary | ICD-10-CM | POA: Diagnosis not present

## 2015-07-20 DIAGNOSIS — D509 Iron deficiency anemia, unspecified: Secondary | ICD-10-CM | POA: Diagnosis not present

## 2015-07-20 DIAGNOSIS — N2581 Secondary hyperparathyroidism of renal origin: Secondary | ICD-10-CM | POA: Diagnosis not present

## 2015-07-20 DIAGNOSIS — N186 End stage renal disease: Secondary | ICD-10-CM | POA: Diagnosis not present

## 2015-07-20 DIAGNOSIS — D689 Coagulation defect, unspecified: Secondary | ICD-10-CM | POA: Diagnosis not present

## 2015-07-26 ENCOUNTER — Telehealth: Payer: Self-pay | Admitting: *Deleted

## 2015-07-26 DIAGNOSIS — I4891 Unspecified atrial fibrillation: Secondary | ICD-10-CM | POA: Diagnosis not present

## 2015-07-26 DIAGNOSIS — N186 End stage renal disease: Secondary | ICD-10-CM | POA: Diagnosis not present

## 2015-07-26 DIAGNOSIS — N2581 Secondary hyperparathyroidism of renal origin: Secondary | ICD-10-CM | POA: Diagnosis not present

## 2015-07-26 DIAGNOSIS — D509 Iron deficiency anemia, unspecified: Secondary | ICD-10-CM | POA: Diagnosis not present

## 2015-07-26 NOTE — Telephone Encounter (Signed)
Called [facility] to follow-up with patient's lab results that were due on 07/24/15. I had spoken with [name] Bender on Tuesday and she stated she would ensure that they would obtain an INR.  After speaking with [name] Bender, on today, she stated the patient did not show up on Wednesday for [facility].  Called the patient to inform that she was overdue for her INR check & we needed her to report to HD today so her INR can be drawn. She stated she thought it had already been done. She sated she would report to [facility] today to ensure we obtain these results.

## 2015-07-27 LAB — PROTIME-INR: INR: 1.4 — AB (ref 0.9–1.1)

## 2015-07-29 ENCOUNTER — Ambulatory Visit (INDEPENDENT_AMBULATORY_CARE_PROVIDER_SITE_OTHER): Payer: Medicare Other | Admitting: Pharmacist

## 2015-07-29 DIAGNOSIS — Z5181 Encounter for therapeutic drug level monitoring: Secondary | ICD-10-CM

## 2015-07-29 DIAGNOSIS — I4891 Unspecified atrial fibrillation: Secondary | ICD-10-CM

## 2015-07-30 DIAGNOSIS — Z992 Dependence on renal dialysis: Secondary | ICD-10-CM | POA: Diagnosis not present

## 2015-07-30 DIAGNOSIS — N186 End stage renal disease: Secondary | ICD-10-CM | POA: Diagnosis not present

## 2015-07-30 DIAGNOSIS — Q612 Polycystic kidney, adult type: Secondary | ICD-10-CM | POA: Diagnosis not present

## 2015-07-31 DIAGNOSIS — D631 Anemia in chronic kidney disease: Secondary | ICD-10-CM | POA: Diagnosis not present

## 2015-07-31 DIAGNOSIS — D509 Iron deficiency anemia, unspecified: Secondary | ICD-10-CM | POA: Diagnosis not present

## 2015-07-31 DIAGNOSIS — N186 End stage renal disease: Secondary | ICD-10-CM | POA: Diagnosis not present

## 2015-07-31 DIAGNOSIS — N2581 Secondary hyperparathyroidism of renal origin: Secondary | ICD-10-CM | POA: Diagnosis not present

## 2015-08-02 DIAGNOSIS — N186 End stage renal disease: Secondary | ICD-10-CM | POA: Diagnosis not present

## 2015-08-02 DIAGNOSIS — D631 Anemia in chronic kidney disease: Secondary | ICD-10-CM | POA: Diagnosis not present

## 2015-08-02 DIAGNOSIS — N2581 Secondary hyperparathyroidism of renal origin: Secondary | ICD-10-CM | POA: Diagnosis not present

## 2015-08-02 DIAGNOSIS — D509 Iron deficiency anemia, unspecified: Secondary | ICD-10-CM | POA: Diagnosis not present

## 2015-08-07 DIAGNOSIS — D631 Anemia in chronic kidney disease: Secondary | ICD-10-CM | POA: Diagnosis not present

## 2015-08-07 DIAGNOSIS — N186 End stage renal disease: Secondary | ICD-10-CM | POA: Diagnosis not present

## 2015-08-07 DIAGNOSIS — D509 Iron deficiency anemia, unspecified: Secondary | ICD-10-CM | POA: Diagnosis not present

## 2015-08-07 DIAGNOSIS — N2581 Secondary hyperparathyroidism of renal origin: Secondary | ICD-10-CM | POA: Diagnosis not present

## 2015-08-07 DIAGNOSIS — I4891 Unspecified atrial fibrillation: Secondary | ICD-10-CM | POA: Diagnosis not present

## 2015-08-07 LAB — PROTIME-INR: INR: 1.3 — AB (ref 0.9–1.1)

## 2015-08-09 ENCOUNTER — Ambulatory Visit (INDEPENDENT_AMBULATORY_CARE_PROVIDER_SITE_OTHER): Payer: Medicare Other | Admitting: Cardiology

## 2015-08-09 DIAGNOSIS — I4891 Unspecified atrial fibrillation: Secondary | ICD-10-CM

## 2015-08-09 DIAGNOSIS — Z5181 Encounter for therapeutic drug level monitoring: Secondary | ICD-10-CM

## 2015-08-12 DIAGNOSIS — N186 End stage renal disease: Secondary | ICD-10-CM | POA: Diagnosis not present

## 2015-08-12 DIAGNOSIS — N2581 Secondary hyperparathyroidism of renal origin: Secondary | ICD-10-CM | POA: Diagnosis not present

## 2015-08-12 DIAGNOSIS — D631 Anemia in chronic kidney disease: Secondary | ICD-10-CM | POA: Diagnosis not present

## 2015-08-12 DIAGNOSIS — D509 Iron deficiency anemia, unspecified: Secondary | ICD-10-CM | POA: Diagnosis not present

## 2015-08-15 DIAGNOSIS — N186 End stage renal disease: Secondary | ICD-10-CM | POA: Diagnosis not present

## 2015-08-15 DIAGNOSIS — D631 Anemia in chronic kidney disease: Secondary | ICD-10-CM | POA: Diagnosis not present

## 2015-08-15 DIAGNOSIS — N2581 Secondary hyperparathyroidism of renal origin: Secondary | ICD-10-CM | POA: Diagnosis not present

## 2015-08-15 DIAGNOSIS — D509 Iron deficiency anemia, unspecified: Secondary | ICD-10-CM | POA: Diagnosis not present

## 2015-08-17 DIAGNOSIS — D631 Anemia in chronic kidney disease: Secondary | ICD-10-CM | POA: Diagnosis not present

## 2015-08-17 DIAGNOSIS — N2581 Secondary hyperparathyroidism of renal origin: Secondary | ICD-10-CM | POA: Diagnosis not present

## 2015-08-17 DIAGNOSIS — N186 End stage renal disease: Secondary | ICD-10-CM | POA: Diagnosis not present

## 2015-08-17 DIAGNOSIS — D509 Iron deficiency anemia, unspecified: Secondary | ICD-10-CM | POA: Diagnosis not present

## 2015-08-20 DIAGNOSIS — D509 Iron deficiency anemia, unspecified: Secondary | ICD-10-CM | POA: Diagnosis not present

## 2015-08-20 DIAGNOSIS — N2581 Secondary hyperparathyroidism of renal origin: Secondary | ICD-10-CM | POA: Diagnosis not present

## 2015-08-20 DIAGNOSIS — D631 Anemia in chronic kidney disease: Secondary | ICD-10-CM | POA: Diagnosis not present

## 2015-08-20 DIAGNOSIS — N186 End stage renal disease: Secondary | ICD-10-CM | POA: Diagnosis not present

## 2015-08-24 DIAGNOSIS — D631 Anemia in chronic kidney disease: Secondary | ICD-10-CM | POA: Diagnosis not present

## 2015-08-24 DIAGNOSIS — N2581 Secondary hyperparathyroidism of renal origin: Secondary | ICD-10-CM | POA: Diagnosis not present

## 2015-08-24 DIAGNOSIS — N186 End stage renal disease: Secondary | ICD-10-CM | POA: Diagnosis not present

## 2015-08-24 DIAGNOSIS — I4891 Unspecified atrial fibrillation: Secondary | ICD-10-CM | POA: Diagnosis not present

## 2015-08-24 DIAGNOSIS — D509 Iron deficiency anemia, unspecified: Secondary | ICD-10-CM | POA: Diagnosis not present

## 2015-08-24 LAB — PROTIME-INR: INR: 1.4 — AB (ref 0.9–1.1)

## 2015-08-26 ENCOUNTER — Ambulatory Visit (INDEPENDENT_AMBULATORY_CARE_PROVIDER_SITE_OTHER): Payer: Medicare Other | Admitting: Cardiology

## 2015-08-26 DIAGNOSIS — I4891 Unspecified atrial fibrillation: Secondary | ICD-10-CM

## 2015-08-26 DIAGNOSIS — Z5181 Encounter for therapeutic drug level monitoring: Secondary | ICD-10-CM

## 2015-08-27 DIAGNOSIS — D631 Anemia in chronic kidney disease: Secondary | ICD-10-CM | POA: Diagnosis not present

## 2015-08-27 DIAGNOSIS — D509 Iron deficiency anemia, unspecified: Secondary | ICD-10-CM | POA: Diagnosis not present

## 2015-08-27 DIAGNOSIS — Q612 Polycystic kidney, adult type: Secondary | ICD-10-CM | POA: Diagnosis not present

## 2015-08-27 DIAGNOSIS — N2581 Secondary hyperparathyroidism of renal origin: Secondary | ICD-10-CM | POA: Diagnosis not present

## 2015-08-27 DIAGNOSIS — N186 End stage renal disease: Secondary | ICD-10-CM | POA: Diagnosis not present

## 2015-08-27 DIAGNOSIS — Z992 Dependence on renal dialysis: Secondary | ICD-10-CM | POA: Diagnosis not present

## 2015-08-29 ENCOUNTER — Telehealth: Payer: Self-pay | Admitting: Cardiology

## 2015-08-29 MED ORDER — WARFARIN SODIUM 5 MG PO TABS: ORAL_TABLET | ORAL | Status: DC

## 2015-08-29 NOTE — Telephone Encounter (Signed)
[facility]  

## 2015-08-29 NOTE — Telephone Encounter (Signed)
New message        *STAT* If patient is at the pharmacy, call can be transferred to refill team.   1. Which medications need to be refilled? (please list name of each medication and dose if known) warfarin  2. Which pharmacy/location (including street and city if local pharmacy) is medication to [facility] sent to[facility] [place]  [phone]  3. Do they need a 30 day or 90 day supply?  30 day supply

## 2015-08-29 NOTE — Telephone Encounter (Signed)
[address] patient 

## 2015-08-31 ENCOUNTER — Other Ambulatory Visit: Payer: Self-pay | Admitting: Cardiology

## 2015-08-31 DIAGNOSIS — D631 Anemia in chronic kidney disease: Secondary | ICD-10-CM | POA: Diagnosis not present

## 2015-08-31 DIAGNOSIS — N186 End stage renal disease: Secondary | ICD-10-CM | POA: Diagnosis not present

## 2015-08-31 DIAGNOSIS — D509 Iron deficiency anemia, unspecified: Secondary | ICD-10-CM | POA: Diagnosis not present

## 2015-08-31 DIAGNOSIS — N2581 Secondary hyperparathyroidism of renal origin: Secondary | ICD-10-CM | POA: Diagnosis not present

## 2015-09-02 NOTE — Telephone Encounter (Signed)
Rx(s) sent to pharmacy electronically. OV 3/29 

## 2015-09-04 DIAGNOSIS — N186 End stage renal disease: Secondary | ICD-10-CM | POA: Diagnosis not present

## 2015-09-04 DIAGNOSIS — D509 Iron deficiency anemia, unspecified: Secondary | ICD-10-CM | POA: Diagnosis not present

## 2015-09-04 DIAGNOSIS — N2581 Secondary hyperparathyroidism of renal origin: Secondary | ICD-10-CM | POA: Diagnosis not present

## 2015-09-04 DIAGNOSIS — D631 Anemia in chronic kidney disease: Secondary | ICD-10-CM | POA: Diagnosis not present

## 2015-09-05 DIAGNOSIS — D631 Anemia in chronic kidney disease: Secondary | ICD-10-CM | POA: Diagnosis not present

## 2015-09-05 DIAGNOSIS — N2581 Secondary hyperparathyroidism of renal origin: Secondary | ICD-10-CM | POA: Diagnosis not present

## 2015-09-05 DIAGNOSIS — D509 Iron deficiency anemia, unspecified: Secondary | ICD-10-CM | POA: Diagnosis not present

## 2015-09-05 DIAGNOSIS — D689 Coagulation defect, unspecified: Secondary | ICD-10-CM | POA: Diagnosis not present

## 2015-09-05 DIAGNOSIS — N186 End stage renal disease: Secondary | ICD-10-CM | POA: Diagnosis not present

## 2015-09-06 ENCOUNTER — Ambulatory Visit (INDEPENDENT_AMBULATORY_CARE_PROVIDER_SITE_OTHER): Payer: Medicare Other | Admitting: Cardiovascular Disease

## 2015-09-06 DIAGNOSIS — I4891 Unspecified atrial fibrillation: Secondary | ICD-10-CM

## 2015-09-06 DIAGNOSIS — Z5181 Encounter for therapeutic drug level monitoring: Secondary | ICD-10-CM

## 2015-09-06 LAB — PROTIME-INR: INR: 2.8 — AB (ref 0.9–1.1)

## 2015-09-10 DIAGNOSIS — N2581 Secondary hyperparathyroidism of renal origin: Secondary | ICD-10-CM | POA: Diagnosis not present

## 2015-09-10 DIAGNOSIS — D631 Anemia in chronic kidney disease: Secondary | ICD-10-CM | POA: Diagnosis not present

## 2015-09-10 DIAGNOSIS — N186 End stage renal disease: Secondary | ICD-10-CM | POA: Diagnosis not present

## 2015-09-10 DIAGNOSIS — D509 Iron deficiency anemia, unspecified: Secondary | ICD-10-CM | POA: Diagnosis not present

## 2015-09-11 ENCOUNTER — Other Ambulatory Visit: Payer: Self-pay | Admitting: *Deleted

## 2015-09-11 MED ORDER — METOPROLOL TARTRATE 100 MG PO TABS: 100.0000 mg | ORAL_TABLET | Freq: Every day | ORAL | Status: DC

## 2015-09-11 NOTE — Telephone Encounter (Signed)
REFILL 

## 2015-09-14 DIAGNOSIS — D509 Iron deficiency anemia, unspecified: Secondary | ICD-10-CM | POA: Diagnosis not present

## 2015-09-14 DIAGNOSIS — N2581 Secondary hyperparathyroidism of renal origin: Secondary | ICD-10-CM | POA: Diagnosis not present

## 2015-09-14 DIAGNOSIS — D631 Anemia in chronic kidney disease: Secondary | ICD-10-CM | POA: Diagnosis not present

## 2015-09-14 DIAGNOSIS — N186 End stage renal disease: Secondary | ICD-10-CM | POA: Diagnosis not present

## 2015-09-19 DIAGNOSIS — I4891 Unspecified atrial fibrillation: Secondary | ICD-10-CM | POA: Diagnosis not present

## 2015-09-19 DIAGNOSIS — N2581 Secondary hyperparathyroidism of renal origin: Secondary | ICD-10-CM | POA: Diagnosis not present

## 2015-09-19 DIAGNOSIS — D509 Iron deficiency anemia, unspecified: Secondary | ICD-10-CM | POA: Diagnosis not present

## 2015-09-19 DIAGNOSIS — N186 End stage renal disease: Secondary | ICD-10-CM | POA: Diagnosis not present

## 2015-09-19 DIAGNOSIS — D631 Anemia in chronic kidney disease: Secondary | ICD-10-CM | POA: Diagnosis not present

## 2015-09-19 LAB — PROTIME-INR: INR: 3.4 — AB (ref 0.9–1.1)

## 2015-09-20 ENCOUNTER — Ambulatory Visit (INDEPENDENT_AMBULATORY_CARE_PROVIDER_SITE_OTHER): Payer: Medicare Other | Admitting: Cardiology

## 2015-09-20 DIAGNOSIS — Z5181 Encounter for therapeutic drug level monitoring: Secondary | ICD-10-CM

## 2015-09-20 DIAGNOSIS — I4891 Unspecified atrial fibrillation: Secondary | ICD-10-CM

## 2015-09-24 DIAGNOSIS — D631 Anemia in chronic kidney disease: Secondary | ICD-10-CM | POA: Diagnosis not present

## 2015-09-24 DIAGNOSIS — N186 End stage renal disease: Secondary | ICD-10-CM | POA: Diagnosis not present

## 2015-09-24 DIAGNOSIS — N2581 Secondary hyperparathyroidism of renal origin: Secondary | ICD-10-CM | POA: Diagnosis not present

## 2015-09-24 DIAGNOSIS — D509 Iron deficiency anemia, unspecified: Secondary | ICD-10-CM | POA: Diagnosis not present

## 2015-09-25 ENCOUNTER — Ambulatory Visit: Payer: Medicare Other | Admitting: Cardiology

## 2015-09-27 DIAGNOSIS — N186 End stage renal disease: Secondary | ICD-10-CM | POA: Diagnosis not present

## 2015-09-27 DIAGNOSIS — Q612 Polycystic kidney, adult type: Secondary | ICD-10-CM | POA: Diagnosis not present

## 2015-09-27 DIAGNOSIS — Z992 Dependence on renal dialysis: Secondary | ICD-10-CM | POA: Diagnosis not present

## 2015-09-28 DIAGNOSIS — N186 End stage renal disease: Secondary | ICD-10-CM | POA: Diagnosis not present

## 2015-09-28 DIAGNOSIS — N2581 Secondary hyperparathyroidism of renal origin: Secondary | ICD-10-CM | POA: Diagnosis not present

## 2015-09-28 DIAGNOSIS — D631 Anemia in chronic kidney disease: Secondary | ICD-10-CM | POA: Diagnosis not present

## 2015-09-28 DIAGNOSIS — D509 Iron deficiency anemia, unspecified: Secondary | ICD-10-CM | POA: Diagnosis not present

## 2015-10-03 DIAGNOSIS — N2581 Secondary hyperparathyroidism of renal origin: Secondary | ICD-10-CM | POA: Diagnosis not present

## 2015-10-03 DIAGNOSIS — D509 Iron deficiency anemia, unspecified: Secondary | ICD-10-CM | POA: Diagnosis not present

## 2015-10-03 DIAGNOSIS — N186 End stage renal disease: Secondary | ICD-10-CM | POA: Diagnosis not present

## 2015-10-03 DIAGNOSIS — D631 Anemia in chronic kidney disease: Secondary | ICD-10-CM | POA: Diagnosis not present

## 2015-10-08 DIAGNOSIS — D509 Iron deficiency anemia, unspecified: Secondary | ICD-10-CM | POA: Diagnosis not present

## 2015-10-08 DIAGNOSIS — D631 Anemia in chronic kidney disease: Secondary | ICD-10-CM | POA: Diagnosis not present

## 2015-10-08 DIAGNOSIS — N2581 Secondary hyperparathyroidism of renal origin: Secondary | ICD-10-CM | POA: Diagnosis not present

## 2015-10-08 DIAGNOSIS — N186 End stage renal disease: Secondary | ICD-10-CM | POA: Diagnosis not present

## 2015-10-12 DIAGNOSIS — D509 Iron deficiency anemia, unspecified: Secondary | ICD-10-CM | POA: Diagnosis not present

## 2015-10-12 DIAGNOSIS — N186 End stage renal disease: Secondary | ICD-10-CM | POA: Diagnosis not present

## 2015-10-12 DIAGNOSIS — D631 Anemia in chronic kidney disease: Secondary | ICD-10-CM | POA: Diagnosis not present

## 2015-10-12 DIAGNOSIS — N2581 Secondary hyperparathyroidism of renal origin: Secondary | ICD-10-CM | POA: Diagnosis not present

## 2015-10-12 DIAGNOSIS — I4891 Unspecified atrial fibrillation: Secondary | ICD-10-CM | POA: Diagnosis not present

## 2015-10-12 LAB — PROTIME-INR: INR: 4.4 — AB (ref 0.9–1.1)

## 2015-10-15 ENCOUNTER — Telehealth: Payer: Self-pay | Admitting: *Deleted

## 2015-10-15 NOTE — Telephone Encounter (Signed)
Spoke with nurse at HD, she states pt only shows up maybe once or twice a week for her treatments. Thus, it is hard to obtain her scheduled  lab draws but they did draw PT/INR on Saturday the 15th.  They will forward result to us.

## 2015-10-16 ENCOUNTER — Ambulatory Visit (INDEPENDENT_AMBULATORY_CARE_PROVIDER_SITE_OTHER): Payer: Medicare Other | Admitting: Cardiology

## 2015-10-16 ENCOUNTER — Telehealth: Payer: Self-pay | Admitting: *Deleted

## 2015-10-16 DIAGNOSIS — I4891 Unspecified atrial fibrillation: Secondary | ICD-10-CM

## 2015-10-16 DIAGNOSIS — Z5181 Encounter for therapeutic drug level monitoring: Secondary | ICD-10-CM

## 2015-10-16 NOTE — Telephone Encounter (Signed)
I called [facility] & spoke with [place] regarding the pt's INR results.  The pt had her INR drawn on 10/12/15 & [place] stated they were resulted today, she stated that the INR was going to have to redrawn because the pt received 1100 units of Heparin prior to her lab being drawn & that they pt was due back tomorrow & they will obtain another INR & fax to [facility] after resulted.  Also, she will fax over the lab results for our records but will redraw on 10/17/15 due to pt receiving Heparin during lab draw.

## 2015-10-17 DIAGNOSIS — D631 Anemia in chronic kidney disease: Secondary | ICD-10-CM | POA: Diagnosis not present

## 2015-10-17 DIAGNOSIS — N186 End stage renal disease: Secondary | ICD-10-CM | POA: Diagnosis not present

## 2015-10-17 DIAGNOSIS — D509 Iron deficiency anemia, unspecified: Secondary | ICD-10-CM | POA: Diagnosis not present

## 2015-10-17 DIAGNOSIS — N2581 Secondary hyperparathyroidism of renal origin: Secondary | ICD-10-CM | POA: Diagnosis not present

## 2015-10-19 DIAGNOSIS — D509 Iron deficiency anemia, unspecified: Secondary | ICD-10-CM | POA: Diagnosis not present

## 2015-10-19 DIAGNOSIS — D631 Anemia in chronic kidney disease: Secondary | ICD-10-CM | POA: Diagnosis not present

## 2015-10-19 DIAGNOSIS — N186 End stage renal disease: Secondary | ICD-10-CM | POA: Diagnosis not present

## 2015-10-19 DIAGNOSIS — N2581 Secondary hyperparathyroidism of renal origin: Secondary | ICD-10-CM | POA: Diagnosis not present

## 2015-10-24 ENCOUNTER — Other Ambulatory Visit: Payer: Self-pay | Admitting: *Deleted

## 2015-10-24 DIAGNOSIS — D631 Anemia in chronic kidney disease: Secondary | ICD-10-CM | POA: Diagnosis not present

## 2015-10-24 DIAGNOSIS — N186 End stage renal disease: Secondary | ICD-10-CM | POA: Diagnosis not present

## 2015-10-24 DIAGNOSIS — N2581 Secondary hyperparathyroidism of renal origin: Secondary | ICD-10-CM | POA: Diagnosis not present

## 2015-10-24 DIAGNOSIS — D509 Iron deficiency anemia, unspecified: Secondary | ICD-10-CM | POA: Diagnosis not present

## 2015-10-24 DIAGNOSIS — D689 Coagulation defect, unspecified: Secondary | ICD-10-CM | POA: Diagnosis not present

## 2015-10-24 LAB — PROTIME-INR: INR: 2.9 — AB (ref 0.9–1.1)

## 2015-10-24 MED ORDER — WARFARIN SODIUM 5 MG PO TABS: ORAL_TABLET | ORAL | Status: DC

## 2015-10-26 ENCOUNTER — Emergency Department (HOSPITAL_COMMUNITY)
Admission: EM | Admit: 2015-10-26 | Discharge: 2015-10-26 | Disposition: A | Discharge status: Home / Self Care | Payer: Medicare Other | Attending: Emergency Medicine | Admitting: Emergency Medicine

## 2015-10-26 ENCOUNTER — Encounter (HOSPITAL_COMMUNITY): Payer: Self-pay

## 2015-10-26 DIAGNOSIS — Z79899 Other long term (current) drug therapy: Secondary | ICD-10-CM | POA: Diagnosis not present

## 2015-10-26 DIAGNOSIS — N186 End stage renal disease: Secondary | ICD-10-CM | POA: Diagnosis not present

## 2015-10-26 DIAGNOSIS — Z992 Dependence on renal dialysis: Secondary | ICD-10-CM | POA: Diagnosis not present

## 2015-10-26 DIAGNOSIS — Q613 Polycystic kidney, unspecified: Secondary | ICD-10-CM | POA: Diagnosis not present

## 2015-10-26 DIAGNOSIS — I120 Hypertensive chronic kidney disease with stage 5 chronic kidney disease or end stage renal disease: Secondary | ICD-10-CM | POA: Diagnosis not present

## 2015-10-26 DIAGNOSIS — I499 Cardiac arrhythmia, unspecified: Secondary | ICD-10-CM | POA: Insufficient documentation

## 2015-10-26 DIAGNOSIS — D509 Iron deficiency anemia, unspecified: Secondary | ICD-10-CM | POA: Diagnosis not present

## 2015-10-26 DIAGNOSIS — I4891 Unspecified atrial fibrillation: Secondary | ICD-10-CM | POA: Diagnosis not present

## 2015-10-26 DIAGNOSIS — Z7901 Long term (current) use of anticoagulants: Secondary | ICD-10-CM | POA: Diagnosis not present

## 2015-10-26 DIAGNOSIS — L03115 Cellulitis of right lower limb: Secondary | ICD-10-CM

## 2015-10-26 DIAGNOSIS — D631 Anemia in chronic kidney disease: Secondary | ICD-10-CM | POA: Diagnosis not present

## 2015-10-26 DIAGNOSIS — R2241 Localized swelling, mass and lump, right lower limb: Secondary | ICD-10-CM | POA: Diagnosis present

## 2015-10-26 DIAGNOSIS — N2581 Secondary hyperparathyroidism of renal origin: Secondary | ICD-10-CM | POA: Diagnosis not present

## 2015-10-26 LAB — CBC WITH DIFFERENTIAL/PLATELET
Basophils Absolute: 0 10*3/uL (ref 0.0–0.1)
Basophils Relative: 0 %
EOS PCT: 2 %
Eosinophils Absolute: 0.1 10*3/uL (ref 0.0–0.7)
HEMATOCRIT: 43.2 % (ref 36.0–46.0)
Hemoglobin: 13.5 g/dL (ref 12.0–15.0)
LYMPHS PCT: 23 %
Lymphs Abs: 1 10*3/uL (ref 0.7–4.0)
MCH: 31.3 pg (ref 26.0–34.0)
MCHC: 31.3 g/dL (ref 30.0–36.0)
MCV: 100.2 fL — AB (ref 78.0–100.0)
MONO ABS: 0.6 10*3/uL (ref 0.1–1.0)
MONOS PCT: 12 %
NEUTROS ABS: 2.8 10*3/uL (ref 1.7–7.7)
Neutrophils Relative %: 63 %
PLATELETS: 125 10*3/uL — AB (ref 150–400)
RBC: 4.31 MIL/uL (ref 3.87–5.11)
RDW: 15.4 % (ref 11.5–15.5)
WBC: 4.5 10*3/uL (ref 4.0–10.5)

## 2015-10-26 LAB — COMPREHENSIVE METABOLIC PANEL
ALBUMIN: 2.9 g/dL — AB (ref 3.5–5.0)
ALT: 12 U/L — AB (ref 14–54)
AST: 16 U/L (ref 15–41)
Alkaline Phosphatase: 56 U/L (ref 38–126)
Anion gap: 12 (ref 5–15)
BILIRUBIN TOTAL: 0.4 mg/dL (ref 0.3–1.2)
BUN: 13 mg/dL (ref 6–20)
CHLORIDE: 97 mmol/L — AB (ref 101–111)
CO2: 30 mmol/L (ref 22–32)
CREATININE: 5.39 mg/dL — AB (ref 0.44–1.00)
Calcium: 9 mg/dL (ref 8.9–10.3)
GFR calc Af Amer: 9 mL/min — ABNORMAL LOW (ref >60)
GFR, EST NON AFRICAN AMERICAN: 8 mL/min — AB (ref >60)
GLUCOSE: 107 mg/dL — AB (ref 65–99)
POTASSIUM: 4 mmol/L (ref 3.5–5.1)
Sodium: 139 mmol/L (ref 135–145)
TOTAL PROTEIN: 6.6 g/dL (ref 6.5–8.1)

## 2015-10-26 LAB — I-STAT CG4 LACTIC ACID, ED: LACTIC ACID, VENOUS: 1.49 mmol/L (ref 0.5–2.0)

## 2015-10-26 MED ORDER — CLINDAMYCIN HCL 150 MG PO CAPS: 300.0000 mg | ORAL_CAPSULE | Freq: Four times a day (QID) | ORAL | Status: AC

## 2015-10-26 MED ORDER — CLINDAMYCIN PHOSPHATE 600 MG/50ML IV SOLN
600.0000 mg | Freq: Once | INTRAVENOUS | Status: AC
  Administered 2015-10-26: 600 mg via INTRAVENOUS
  Filled 2015-10-26: qty 50

## 2015-10-26 NOTE — ED Notes (Signed)
Patient here with right lower leg infection for several days that she describes today as weeping. Has had cellulitis before and currently not on antibiotics

## 2015-10-26 NOTE — ED Provider Notes (Signed)
CSN: [id number]     Arrival date & time 10/26/15  1645 History   First MD Initiated Contact with Patient 10/26/15 1823     Chief Complaint  Patient presents with  . 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 Patient reports having worsening redness, weeping and swelling to her right lower leg over the past 3 weeks. She notes she initially had a small scratch on her right lower leg and then reports the leg started to become red and swollen. Patient reports she has had cellulitis in the past but denies currently being on any antibiotics. Denies pain, fever, chills, body ache, cough, chest pain, shortness of breath, abdominal pain, nausea, vomiting, diarrhea, numbness, tingling, weakness. Patient reports she has continued going to her dialysis treatments as scheduled and notes her last treatment was completed today prior to arrival without any complications.  Past Medical History  Diagnosis Date  . Atrial fibrillation (HCC)   . Hypercholesterolemia   . HTN (hypertension)   . Polycystic kidney disease   . Supraclinoid carotid artery aneurysm, small (HCC)    Past Surgical History  Procedure Laterality Date  . Thrombectomy      of right upper arm ateriovenous graft with revision   . Rivght ovary removal      secondary to a cyst  . C-sections      x2  . Appendectomy    . Total knee arthroplasty  feb 2005    right knee  . Right oophorectomy     Family History  Problem Relation Age of Onset  . Stroke Mother   . Atrial fibrillation Father    Social History  Substance Use Topics  . Smoking status: Never Smoker   . Smokeless tobacco: None  . Alcohol Use: Yes     Comment: occasional   OB History    No  data available     Review of Systems  Cardiovascular: Positive for leg swelling.  Skin: Positive for wound (right lower leg).  All other systems reviewed and are negative.     Allergies  Ancef  Home Medications   Prior to Admission medications   Medication Sig Start Date End Date Taking? Authorizing Provider  calcium acetate (PHOSLO) 667 MG capsule Take 667 mg by mouth 3 (three) times daily with meals.      Historical Provider, MD  cinacalcet (SENSIPAR) 30 MG tablet Take 30 mg by mouth daily.      Historical Provider, MD  clindamycin (CLEOCIN) 150 MG capsule Take 2 capsules (300 mg total) by mouth every 6 (six) hours. 10/26/15 11/02/15  Barrett Henle, PA-C  digoxin (LANOXIN) 0.125 MG tablet TAKE 1 TABLET (125 MCG TOTAL) BY MOUTH 2 (TWO) TIMES A WEEK. ON TUESDAY AND FRIDAY 09/02/15   Lewayne Bunting, MD  fenofibrate (TRICOR) 145 MG tablet Take 145 mg by mouth daily.      Historical Provider, MD  metoprolol (LOPRESSOR) 100 MG tablet Take 1 tablet (100 mg total) by mouth daily. 09/11/15   Lewayne Bunting, MD  sevelamer (RENVELA) 800 MG tablet Take 2,400 mg by mouth 3 (three) times daily before meals.     Historical Provider, MD  warfarin (COUMADIN) 5 MG tablet Take 1 tablet by mouth daily or as directed  by coumadin clinic 10/24/15   Lewayne BuntingBrian S Crenshaw, MD   BP 136/82 mmHg  Pulse 96  Temp(Src) 98 F (36.7 C) (Oral)  Resp 18  Wt 106.5 kg  SpO2 99% Physical Exam  Constitutional: She is oriented to person, place, and time. She appears well-developed and well-nourished.  Morbidly obese female  HENT:  Head: Normocephalic and atraumatic.  Mouth/Throat: Oropharynx is clear and moist. No oropharyngeal exudate.  Eyes: Conjunctivae and EOM are normal. Right eye exhibits no discharge. Left eye exhibits no discharge. No scleral icterus.  Neck: Normal range of motion. Neck supple.  Cardiovascular: Normal rate, normal heart sounds and intact distal pulses.   Irregularly irregular rhythm    Pulmonary/Chest: Effort normal and breath sounds normal. No respiratory distress. She has no wheezes. She has no rales. She exhibits no tenderness.  Abdominal: Soft. Bowel sounds are normal. There is no tenderness.  Musculoskeletal: She exhibits edema.  2+ pitting edema to BLE, unable to palpate PT or DP pulses bilaterally due to swelling. Bilateral feet are pink and warm. FROM of BLE, nontender. Sensation grossly intact.   Neurological: She is alert and oriented to person, place, and time.  Skin: Skin is warm and dry.  Diffuse area of erythema, swelling, warmth and weeping of serous fluid noted to right anterior lower shin.  Nursing note and vitals reviewed.        ED Course  Procedures (including critical care time) Labs Review Labs Reviewed  COMPREHENSIVE METABOLIC PANEL - Abnormal; Notable for the following:    Chloride 97 (*)    Glucose, Bld 107 (*)    Creatinine, Ser 5.39 (*)    Albumin 2.9 (*)    ALT 12 (*)    GFR calc non Af Amer 8 (*)    GFR calc Af Amer 9 (*)    All other components within normal limits  CBC WITH DIFFERENTIAL/PLATELET - Abnormal; Notable for the following:    MCV 100.2 (*)    Platelets 125 (*)    All other components within normal limits  I-STAT CG4 LACTIC ACID, ED  I-STAT CG4 LACTIC ACID, ED    Imaging Review No results found. I have personally reviewed and evaluated these images and lab results as part of my medical decision-making.   EKG Interpretation None      MDM   Final diagnoses:  Cellulitis of right lower extremity   Pt is a hemodialysis pt who presents to the ED with worsening wound infection to her right lower leg with associated weeping of serous fluid. Denies fever. VSS. Exam revealed cellulitis to right anterior lower shin with weeping of serous fluid, nontender to palpation. 2+ pitting edema to BLE which pt reports is chronic. Pt is without gross abscess for which I&D would be possible.  Area marked and pt encouraged to  return if redness begins to streak, extends beyond the markings, and/or fever or nausea/vomiting develop. Dressing placed over wound in the ED. Pt given dose of IV clindamycin in the ED.  Pt is alert, oriented, NAD, afebrile, non tachycardic, nonseptic and nontoxic appearing.  Pt to be d/c on oral antibiotics with strict f/u instructions.      Satira Sarkicole Elizabeth TribbeyNadeau, [place]-C 10/26/15 2028  Pricilla LovelessScott Goldston, MD 10/31/15 2115

## 2015-10-26 NOTE — ED Notes (Signed)
Wound clean and dressing applied as ordered.

## 2015-10-26 NOTE — Discharge Instructions (Signed)
Take your antibiotics as prescribed for the full 7 days. I recommend keeping wound clean using antibacterial soap and water and patting dry. You may change your dressing daily. Follow-up with your primary care provider in 2-3 days for wound recheck. Please return to the Emergency Department if symptoms worsen or new onset of fever, worsening redness or swelling, pain, abdominal pain, vomiting, numbness, tingling, weakness.

## 2015-10-27 DIAGNOSIS — Z992 Dependence on renal dialysis: Secondary | ICD-10-CM | POA: Diagnosis not present

## 2015-10-27 DIAGNOSIS — Q612 Polycystic kidney, adult type: Secondary | ICD-10-CM | POA: Diagnosis not present

## 2015-10-27 DIAGNOSIS — N186 End stage renal disease: Secondary | ICD-10-CM | POA: Diagnosis not present

## 2015-10-28 ENCOUNTER — Ambulatory Visit (INDEPENDENT_AMBULATORY_CARE_PROVIDER_SITE_OTHER): Payer: Medicare Other | Admitting: Internal Medicine

## 2015-10-28 DIAGNOSIS — Z5181 Encounter for therapeutic drug level monitoring: Secondary | ICD-10-CM

## 2015-10-28 DIAGNOSIS — I4891 Unspecified atrial fibrillation: Secondary | ICD-10-CM

## 2015-10-31 DIAGNOSIS — N186 End stage renal disease: Secondary | ICD-10-CM | POA: Diagnosis not present

## 2015-10-31 DIAGNOSIS — L03115 Cellulitis of right lower limb: Secondary | ICD-10-CM | POA: Diagnosis not present

## 2015-10-31 DIAGNOSIS — N2581 Secondary hyperparathyroidism of renal origin: Secondary | ICD-10-CM | POA: Diagnosis not present

## 2015-11-03 ENCOUNTER — Encounter (HOSPITAL_COMMUNITY): Payer: Self-pay | Admitting: Family Medicine

## 2015-11-03 ENCOUNTER — Inpatient Hospital Stay (HOSPITAL_COMMUNITY)
Admission: EM | Admit: 2015-11-03 | Discharge: 2015-11-08 | DRG: 602 | Disposition: A | Discharge status: Skilled Nursing Facility | Payer: Medicare Other | Attending: Internal Medicine | Admitting: Internal Medicine

## 2015-11-03 DIAGNOSIS — I4891 Unspecified atrial fibrillation: Secondary | ICD-10-CM | POA: Diagnosis present

## 2015-11-03 DIAGNOSIS — I120 Hypertensive chronic kidney disease with stage 5 chronic kidney disease or end stage renal disease: Secondary | ICD-10-CM | POA: Diagnosis present

## 2015-11-03 DIAGNOSIS — E785 Hyperlipidemia, unspecified: Secondary | ICD-10-CM | POA: Diagnosis not present

## 2015-11-03 DIAGNOSIS — L03119 Cellulitis of unspecified part of limb: Secondary | ICD-10-CM | POA: Diagnosis present

## 2015-11-03 DIAGNOSIS — L03115 Cellulitis of right lower limb: Secondary | ICD-10-CM | POA: Diagnosis not present

## 2015-11-03 DIAGNOSIS — E8770 Fluid overload, unspecified: Secondary | ICD-10-CM | POA: Diagnosis present

## 2015-11-03 DIAGNOSIS — Z6841 Body Mass Index (BMI) 40.0 and over, adult: Secondary | ICD-10-CM

## 2015-11-03 DIAGNOSIS — D631 Anemia in chronic kidney disease: Secondary | ICD-10-CM | POA: Diagnosis not present

## 2015-11-03 DIAGNOSIS — Z9114 Patient's other noncompliance with medication regimen: Secondary | ICD-10-CM | POA: Diagnosis not present

## 2015-11-03 DIAGNOSIS — N186 End stage renal disease: Secondary | ICD-10-CM | POA: Diagnosis not present

## 2015-11-03 DIAGNOSIS — I482 Chronic atrial fibrillation: Secondary | ICD-10-CM | POA: Diagnosis present

## 2015-11-03 DIAGNOSIS — F419 Anxiety disorder, unspecified: Secondary | ICD-10-CM | POA: Diagnosis present

## 2015-11-03 DIAGNOSIS — Q613 Polycystic kidney, unspecified: Secondary | ICD-10-CM

## 2015-11-03 DIAGNOSIS — Z992 Dependence on renal dialysis: Secondary | ICD-10-CM | POA: Diagnosis not present

## 2015-11-03 DIAGNOSIS — N2581 Secondary hyperparathyroidism of renal origin: Secondary | ICD-10-CM | POA: Diagnosis present

## 2015-11-03 DIAGNOSIS — Z881 Allergy status to other antibiotic agents status: Secondary | ICD-10-CM | POA: Diagnosis not present

## 2015-11-03 DIAGNOSIS — Z7901 Long term (current) use of anticoagulants: Secondary | ICD-10-CM | POA: Diagnosis not present

## 2015-11-03 LAB — COMPREHENSIVE METABOLIC PANEL
ALT: 12 U/L — ABNORMAL LOW (ref 14–54)
ANION GAP: 17 — AB (ref 5–15)
AST: 16 U/L (ref 15–41)
Albumin: 3.2 g/dL — ABNORMAL LOW (ref 3.5–5.0)
Alkaline Phosphatase: 61 U/L (ref 38–126)
BILIRUBIN TOTAL: 0.8 mg/dL (ref 0.3–1.2)
BUN: 53 mg/dL — ABNORMAL HIGH (ref 6–20)
CHLORIDE: 98 mmol/L — AB (ref 101–111)
CO2: 24 mmol/L (ref 22–32)
Calcium: 9.6 mg/dL (ref 8.9–10.3)
Creatinine, Ser: 12.63 mg/dL — ABNORMAL HIGH (ref 0.44–1.00)
GFR calc Af Amer: 3 mL/min — ABNORMAL LOW (ref >60)
GFR, EST NON AFRICAN AMERICAN: 3 mL/min — AB (ref >60)
GLUCOSE: 84 mg/dL (ref 65–99)
POTASSIUM: 4.8 mmol/L (ref 3.5–5.1)
Sodium: 139 mmol/L (ref 135–145)
TOTAL PROTEIN: 7.1 g/dL (ref 6.5–8.1)

## 2015-11-03 LAB — CBC WITH DIFFERENTIAL/PLATELET
BASOS ABS: 0 10*3/uL (ref 0.0–0.1)
Basophils Relative: 0 %
EOS PCT: 2 %
Eosinophils Absolute: 0.1 10*3/uL (ref 0.0–0.7)
HEMATOCRIT: 45.4 % (ref 36.0–46.0)
HEMOGLOBIN: 14.4 g/dL (ref 12.0–15.0)
LYMPHS PCT: 18 %
Lymphs Abs: 1.1 10*3/uL (ref 0.7–4.0)
MCH: 30.9 pg (ref 26.0–34.0)
MCHC: 31.7 g/dL (ref 30.0–36.0)
MCV: 97.4 fL (ref 78.0–100.0)
Monocytes Absolute: 0.6 10*3/uL (ref 0.1–1.0)
Monocytes Relative: 10 %
NEUTROS ABS: 4 10*3/uL (ref 1.7–7.7)
NEUTROS PCT: 70 %
PLATELETS: 150 10*3/uL (ref 150–400)
RBC: 4.66 MIL/uL (ref 3.87–5.11)
RDW: 15.1 % (ref 11.5–15.5)
WBC: 5.7 10*3/uL (ref 4.0–10.5)

## 2015-11-03 MED ORDER — TETANUS-DIPHTH-ACELL PERTUSSIS 5-2.5-18.5 LF-MCG/0.5 IM SUSP
0.5000 mL | Freq: Once | INTRAMUSCULAR | Status: AC
  Administered 2015-11-03: 0.5 mL via INTRAMUSCULAR
  Filled 2015-11-03: qty 0.5

## 2015-11-03 MED ORDER — DIGOXIN 125 MCG PO TABS
0.1250 mg | ORAL_TABLET | ORAL | Status: DC
  Administered 2015-11-05 – 2015-11-08 (×2): 0.125 mg via ORAL
  Filled 2015-11-03 (×2): qty 1

## 2015-11-03 MED ORDER — CLINDAMYCIN PHOSPHATE 600 MG/50ML IV SOLN: 600.0000 mg | Freq: Three times a day (TID) | INTRAVENOUS | Status: DC

## 2015-11-03 MED ORDER — CINACALCET HCL 30 MG PO TABS
30.0000 mg | ORAL_TABLET | Freq: Every day | ORAL | Status: DC
  Administered 2015-11-04 – 2015-11-08 (×5): 30 mg via ORAL
  Filled 2015-11-03 (×5): qty 1

## 2015-11-03 MED ORDER — SEVELAMER CARBONATE 800 MG PO TABS
2400.0000 mg | ORAL_TABLET | Freq: Three times a day (TID) | ORAL | Status: DC
  Administered 2015-11-04 (×3): 2400 mg via ORAL
  Administered 2015-11-05 – 2015-11-06 (×3): 800 mg via ORAL
  Administered 2015-11-06: 1600 mg via ORAL
  Administered 2015-11-07 (×2): 2400 mg via ORAL
  Administered 2015-11-08 (×2): 1600 mg via ORAL
  Filled 2015-11-03 (×12): qty 3

## 2015-11-03 MED ORDER — METOPROLOL TARTRATE 100 MG PO TABS
100.0000 mg | ORAL_TABLET | Freq: Every day | ORAL | Status: DC
  Administered 2015-11-04 – 2015-11-08 (×5): 100 mg via ORAL
  Filled 2015-11-03 (×7): qty 1

## 2015-11-03 MED ORDER — VANCOMYCIN HCL IN DEXTROSE 1-5 GM/200ML-% IV SOLN: 1000.0000 mg | Freq: Once | INTRAVENOUS | Status: DC

## 2015-11-03 MED ORDER — VANCOMYCIN HCL 10 G IV SOLR
2000.0000 mg | Freq: Once | INTRAVENOUS | Status: AC
  Administered 2015-11-03: 2000 mg via INTRAVENOUS
  Filled 2015-11-03: qty 2000

## 2015-11-03 MED ORDER — DIGOXIN 125 MCG PO TABS: 0.1250 mg | ORAL_TABLET | Freq: Every day | ORAL | Status: DC

## 2015-11-03 MED ORDER — PIPERACILLIN-TAZOBACTAM 3.375 G IVPB
3.3750 g | Freq: Once | INTRAVENOUS | Status: DC
  Filled 2015-11-03: qty 50

## 2015-11-03 MED ORDER — PIPERACILLIN-TAZOBACTAM 3.375 G IVPB 30 MIN
3.3750 g | Freq: Once | INTRAVENOUS | Status: AC
  Administered 2015-11-03: 3.375 g via INTRAVENOUS

## 2015-11-03 MED ORDER — CALCIUM ACETATE (PHOS BINDER) 667 MG PO CAPS
667.0000 mg | ORAL_CAPSULE | Freq: Three times a day (TID) | ORAL | Status: DC
  Administered 2015-11-04 – 2015-11-06 (×6): 667 mg via ORAL
  Filled 2015-11-03 (×6): qty 1

## 2015-11-03 MED ORDER — FENOFIBRATE 160 MG PO TABS
160.0000 mg | ORAL_TABLET | Freq: Every day | ORAL | Status: DC
  Administered 2015-11-04 – 2015-11-08 (×5): 160 mg via ORAL
  Filled 2015-11-03 (×6): qty 1

## 2015-11-03 NOTE — ED Provider Notes (Signed)
CSN: [id number]     Arrival date & time 11/03/15  1653 History   First MD Initiated Contact with Patient 11/03/15 2008     Chief Complaint  Patient presents with  . Cellulitis     (Consider location/radiation/quality/duration/timing/severity/associated sxs/prior Treatment) HPI Patient with reddened weeping area on right leg for approximately past month. She was seen here on 10/26/2015 for same complaint and treated with intravenous Cleocin and sent home with prescription of Cleocin which she has completed. Reddened areas no better. It resulted as a result of a scratch that occurred approximate one month ago on her leg. She denies fever but denies vomiting denies other associated symptoms however wound is not improved. Nothing makes symptoms better or worse. Past Medical History  Diagnosis Date  . Atrial fibrillation (HCC)   . Hypercholesterolemia   . HTN (hypertension)   . Polycystic kidney disease   . Supraclinoid carotid artery aneurysm, small (HCC)    Past Surgical History  Procedure Laterality Date  . Thrombectomy      of right upper arm ateriovenous graft with revision   . Rivght ovary removal      secondary to a cyst  . C-sections      x2  . Appendectomy    . Total knee arthroplasty  feb 2005    right knee  . Right oophorectomy     Family History  Problem Relation Age of Onset  . Stroke Mother   . Atrial fibrillation Father    Social History  Substance Use Topics  . Smoking status: Never Smoker   . Smokeless tobacco: None  . Alcohol Use: Yes     Comment: occasional   OB History    No data available     Review of Systems  Constitutional: Negative.   HENT: Negative.   Respiratory: Negative.   Cardiovascular: Negative.   Gastrointestinal: Negative.   Genitourinary:       Mrs. hemodialysis Tuesdays Thursdays and Saturdays. Last hemodialysis session was 3 days ago  Musculoskeletal: Negative.   Skin: Positive for rash and wound.       Wound and reddened area  and right leg  Allergic/Immunologic: Positive for immunocompromised state.       Hemodialysis patient  Neurological: Negative.   Psychiatric/Behavioral: Negative.   All other systems reviewed and are negative.     Allergies  Ancef  Home Medications   Prior to Admission medications   Medication Sig Start Date End Date Taking? Authorizing Provider  calcium acetate (PHOSLO) 667 MG capsule Take 667 mg by mouth 3 (three) times daily with meals.      Historical Provider, MD  cinacalcet (SENSIPAR) 30 MG tablet Take 30 mg by mouth daily.      Historical Provider, MD  digoxin (LANOXIN) 0.125 MG tablet TAKE 1 TABLET (125 MCG TOTAL) BY MOUTH 2 (TWO) TIMES A WEEK. ON TUESDAY AND FRIDAY 09/02/15   Lewayne Bunting, MD  fenofibrate (TRICOR) 145 MG tablet Take 145 mg by mouth daily.      Historical Provider, MD  metoprolol (LOPRESSOR) 100 MG tablet Take 1 tablet (100 mg total) by mouth daily. 09/11/15   Lewayne Bunting, MD  sevelamer (RENVELA) 800 MG tablet Take 2,400 mg by mouth 3 (three) times daily before meals.     Historical Provider, MD  warfarin (COUMADIN) 5 MG tablet Take 1 tablet by mouth daily or as directed by coumadin clinic 10/24/15   Lewayne Bunting, MD   BP 141/97 mmHg  Pulse  115  Temp(Src) 98.3 F (36.8 C) (Oral)  Resp 18  Ht 5\' 3"  (1.6 m)  Wt 235 lb (106.595 kg)  BMI 41.64 kg/m2  SpO2 93% Physical Exam  Constitutional:  Chronically ill-appearing  HENT:  Head: Normocephalic and atraumatic.  Eyes: Conjunctivae are normal. Pupils are equal, round, and reactive to light.  Neck: Neck supple. No tracheal deviation present. No thyromegaly present.  Cardiovascular: Normal rate and regular rhythm.   No murmur heard. Pulmonary/Chest: Effort normal and breath sounds normal.  Abdominal: Soft. Bowel sounds are normal. She exhibits no distension. There is no tenderness.  Obese  Musculoskeletal: Normal range of motion. She exhibits no edema or tenderness.  Neurological: She is alert.  Coordination normal.  Skin: Skin is warm and dry. No rash noted.  Right lower extremity Red and weeping area at right calf and leg from ankle to the proximal third of lower leg. No red streaks proximal to the reddened area. Dialysis graft at right thigh with good thrill. All other extremities without redness or tenderness neurovascular intact  Psychiatric: She has a normal mood and affect.  Nursing note and vitals reviewed.   ED Course  Procedures (including critical care time) Labs Review Labs Reviewed  COMPREHENSIVE METABOLIC PANEL - Abnormal; Notable for the following:    Chloride 98 (*)    BUN 53 (*)    Creatinine, Ser 12.63 (*)    Albumin 3.2 (*)    ALT 12 (*)    GFR calc non Af Amer 3 (*)    GFR calc Af Amer 3 (*)    Anion gap 17 (*)    All other components within normal limits  CBC WITH DIFFERENTIAL/PLATELET    Imaging Review No results found. I have personally reviewed and evaluated these images and lab results as part of my medical decision-making.   EKG Interpretation None     Results for orders placed or performed during the hospital encounter of 11/03/15  CBC with Differential  Result Value Ref Range   WBC 5.7 4.0 - 10.5 K/uL   RBC 4.66 3.87 - 5.11 MIL/uL   Hemoglobin 14.4 12.0 - 15.0 g/dL   HCT 11.945.4 14.736.0 - 82.946.0 %   MCV 97.4 78.0 - 100.0 fL   MCH 30.9 26.0 - 34.0 pg   MCHC 31.7 30.0 - 36.0 g/dL   RDW 56.215.1 13.011.5 - 86.515.5 %   Platelets 150 150 - 400 K/uL   Neutrophils Relative % 70 %   Neutro Abs 4.0 1.7 - 7.7 K/uL   Lymphocytes Relative 18 %   Lymphs Abs 1.1 0.7 - 4.0 K/uL   Monocytes Relative 10 %   Monocytes Absolute 0.6 0.1 - 1.0 K/uL   Eosinophils Relative 2 %   Eosinophils Absolute 0.1 0.0 - 0.7 K/uL   Basophils Relative 0 %   Basophils Absolute 0.0 0.0 - 0.1 K/uL  Comprehensive metabolic panel  Result Value Ref Range   Sodium 139 135 - 145 mmol/L   Potassium 4.8 3.5 - 5.1 mmol/L   Chloride 98 (L) 101 - 111 mmol/L   CO2 24 22 - 32 mmol/L    Glucose, Bld 84 65 - 99 mg/dL   BUN 53 (H) 6 - 20 mg/dL   Creatinine, Ser 78.4612.63 (H) 0.44 - 1.00 mg/dL   Calcium 9.6 8.9 - 96.210.3 mg/dL   Total Protein 7.1 6.5 - 8.1 g/dL   Albumin 3.2 (L) 3.5 - 5.0 g/dL   AST 16 15 - 41 U/L  ALT 12 (L) 14 - 54 U/L   Alkaline Phosphatase 61 38 - 126 U/L   Total Bilirubin 0.8 0.3 - 1.2 mg/dL   GFR calc non Af Amer 3 (L) >60 mL/min   GFR calc Af Amer 3 (L) >60 mL/min   Anion gap 17 (H) 5 - 15   No results found.  MDM  Patient is immunocompromised, has failed outpatient antibiotics. Intravenous antibiotics were ordered Also requires wound care Dr Julian Reil consulted and saw pt in the ed and arranged for inpatient stay. Patient does not require emergent hemodialysis. She did miss hemodialysis yesterday. Dx cellulitis of right leg  Final diagnoses:  None        Doug Sou, MD 11/03/15 2256

## 2015-11-03 NOTE — ED Notes (Signed)
MD at bedside. 

## 2015-11-03 NOTE — ED Notes (Signed)
Pt extremely difficult stick. US used to obtain 1 3 mL blood culture. Unable to get second set at this time.

## 2015-11-03 NOTE — Progress Notes (Signed)
ANTICOAGULATION AND ANTIBIOTIC CONSULT NOTE - Initial Consult  Pharmacy Consult for Coumadin and Vancomycin Indication: atrial fibrillation and cellulitis  Allergies  Allergen Reactions  . Ancef [Cefazolin Sodium] Itching    Patient Measurements: Height: 5\' 3"  (160 cm) Weight: 235 lb (106.595 kg) IBW/kg (Calculated) : 52.4  Vital Signs: Temp: 98.3 F (36.8 C) (05/07 1708) Temp Source: Oral (05/07 1708) BP: 118/91 mmHg (05/07 2245) Pulse Rate: 90 (05/07 2245)  Labs:  Recent Labs  11/03/15 1923  HGB 14.4  HCT 45.4  PLT 150  CREATININE 12.63*    Estimated Creatinine Clearance: 5.5 mL/min (by C-G formula based on Cr of 12.63).   Medical History: Past Medical History  Diagnosis Date  . Atrial fibrillation (HCC)   . Hypercholesterolemia   . HTN (hypertension)   . Polycystic kidney disease   . Supraclinoid carotid artery aneurysm, small (HCC)     Medications:  See electronic med rec  Assessment: 60 y.o. F presents with cellulitis.   AC: Home dose of coumadin 5mg  on Wed and 2.5mg  all other days. Last dose 5/6. CBC ok on admission. STAT INR was hemolyzed so will be redrawing with a.m. labs.  ID: Vanc for RLE cellulitis with erythema/drainage; failed o/p treatment of Clinda. WBC wnl.   5/7 Vanc> 5/7 Zosyn x 1  5/7 BCx x 2:  Nephro: ESRD - HD (T/T/S) - pt missed Sat HD.   Goal of Therapy:  INR 2-3 Monitor platelets by anticoagulation protocol: Yes   Plan:  Daily INR Vanc 2gm IV now Will f/u HD schedule and tolerance for further doses  Christoper Fabianaron Mariyana Fulop, PharmD, BCPS Clinical pharmacist, pager [phone] 11/03/2015,10:49 PM

## 2015-11-03 NOTE — ED Notes (Signed)
Pt here for cellulitis to LE that is worsening since finishing abx, pt under a lot of stress and has a very sick husband and sts that she hasnt been elevating it well.

## 2015-11-03 NOTE — ED Notes (Signed)
PT stuck for IV and blood culture x2 by adam rn and once with ultra sound machine by this RN. Able to obtain 4CC for yellow blood culture vial. Unable to collect from another site at this time.

## 2015-11-03 NOTE — H&P (Signed)
History and Physical    GAVRIELLA HEARST ZOX:[id number] DOB: 02/27/1956 DOA: 11/03/2015  Referring MD/NP/PA: Dr. Rennis Chris  PCP: Dyke Maes, MD Outpatient Specialists: None Patient coming from: Home  Chief Complaint: Cellulitis  HPI: [name] Bender is a 60 y.o. female with medical history significant of ESRD dialysis TTS, missed Saturday dialysis.  Patient presents to the ED with c/o RLE erythema, pain, skin breakage.  She was seen here on 4/29 for same complaint.  Treated with clindamycin as outpatient.  Unfortunatly this failed and drainage, erythema, etc has persisted.  Given IV zosyn and vanc dose in ED.  Review of Systems: As per HPI otherwise 10 point review of systems negative.    Past Medical History  Diagnosis Date  . Atrial fibrillation (HCC)   . Hypercholesterolemia   . HTN (hypertension)   . Polycystic kidney disease   . Supraclinoid carotid artery aneurysm, small (HCC)     Past Surgical History  Procedure Laterality Date  . Thrombectomy      of right upper arm ateriovenous graft with revision   . Rivght ovary removal      secondary to a cyst  . C-sections      x2  . Appendectomy    . Total knee arthroplasty  feb 2005    right knee  . Right oophorectomy       reports that she has never smoked. She does not have any smokeless tobacco history on file. She reports that she drinks alcohol. Her drug history is not on file.  Allergies  Allergen Reactions  . Ancef [Cefazolin Sodium] Itching    Family History  Problem Relation Age of Onset  . Stroke Mother   . Atrial fibrillation Father      Prior to Admission medications   Medication Sig Start Date End Date Taking? Authorizing Provider  calcium acetate (PHOSLO) 667 MG capsule Take 667 mg by mouth 3 (three) times daily with meals.     Yes Historical Provider, MD  cinacalcet (SENSIPAR) 30 MG tablet Take 30 mg by mouth daily.     Yes Historical Provider, MD  digoxin (LANOXIN) 0.125 MG tablet TAKE 1  TABLET (125 MCG TOTAL) BY MOUTH 2 (TWO) TIMES A WEEK. ON TUESDAY AND FRIDAY 09/02/15  Yes Lewayne Bunting, MD  fenofibrate (TRICOR) 145 MG tablet Take 145 mg by mouth daily.     Yes Historical Provider, MD  metoprolol (LOPRESSOR) 100 MG tablet Take 1 tablet (100 mg total) by mouth daily. 09/11/15  Yes Lewayne Bunting, MD  sevelamer (RENVELA) 800 MG tablet Take 2,400 mg by mouth 3 (three) times daily before meals.    Yes Historical Provider, MD  warfarin (COUMADIN) 5 MG tablet Take 1 tablet by mouth daily or as directed by coumadin clinic Patient taking differently: Take 2.5-5 mg by mouth See admin instructions. Take 1 tablet on Wednesday then take 1/2 tablet all the other days 10/24/15  Yes Lewayne Bunting, MD    Physical Exam: Filed Vitals:   11/03/15 1708 11/03/15 2030 11/03/15 2130  BP: 151/123 141/97 121/61  Pulse: 109 115 96  Temp: 98.3 F (36.8 C)    TempSrc: Oral    Resp: 18 18 20   Height: 5\' 3"  (1.6 m)    Weight: 106.595 kg (235 lb)    SpO2: 98% 93% 99%      Constitutional: NAD, calm, comfortable Filed Vitals:   11/03/15 1708 11/03/15 2030 11/03/15 2130  BP: 151/123 141/97 121/61  Pulse: 109 115  96  Temp: 98.3 F (36.8 C)    TempSrc: Oral    Resp: 18 18 20   Height: 5\' 3"  (1.6 m)    Weight: 106.595 kg (235 lb)    SpO2: 98% 93% 99%   Eyes: PERRL, lids and conjunctivae normal ENMT: Mucous membranes are moist. Posterior pharynx clear of any exudate or lesions.Normal dentition.  Neck: normal, supple, no masses, no thyromegaly Respiratory: clear to auscultation bilaterally, no wheezing, no crackles. Normal respiratory effort. No accessory muscle use.  Cardiovascular: Regular rate and rhythm, no murmurs / rubs / gallops. No extremity edema. 2+ pedal pulses. No carotid bruits.  Abdomen: no tenderness, no masses palpated. No hepatosplenomegaly. Bowel sounds positive.  Musculoskeletal: no clubbing / cyanosis. No joint deformity upper and lower extremities. Good ROM, no  contractures. Normal muscle tone.  Skin: Has cellulitis to shin of RLE, looks like this is overlying a venous stasis ulceration / skin changes. Neurologic: CN 2-12 grossly intact. Sensation intact, DTR normal. Strength 5/5 in all 4.  Psychiatric: Normal judgment and insight. Alert and oriented x 3. Normal mood.    Labs on Admission: I have personally reviewed following labs and imaging studies  CBC:  Recent Labs Lab 11/03/15 1923  WBC 5.7  NEUTROABS 4.0  HGB 14.4  HCT 45.4  MCV 97.4  PLT 150   Basic Metabolic Panel:  Recent Labs Lab 11/03/15 1923  NA 139  K 4.8  CL 98*  CO2 24  GLUCOSE 84  BUN 53*  CREATININE 12.63*  CALCIUM 9.6   GFR: Estimated Creatinine Clearance: 5.5 mL/min (by C-G formula based on Cr of 12.63). Liver Function Tests:  Recent Labs Lab 11/03/15 1923  AST 16  ALT 12*  ALKPHOS 61  BILITOT 0.8  PROT 7.1  ALBUMIN 3.2*   No results for input(s): LIPASE, AMYLASE in the last 168 hours. No results for input(s): AMMONIA in the last 168 hours. Coagulation Profile: No results for input(s): INR, PROTIME in the last 168 hours. Cardiac Enzymes: No results for input(s): CKTOTAL, CKMB, CKMBINDEX, TROPONINI in the last 168 hours. BNP (last 3 results) No results for input(s): PROBNP in the last 8760 hours. HbA1C: No results for input(s): HGBA1C in the last 72 hours. CBG: No results for input(s): GLUCAP in the last 168 hours. Lipid Profile: No results for input(s): CHOL, HDL, LDLCALC, TRIG, CHOLHDL, LDLDIRECT in the last 72 hours. Thyroid Function Tests: No results for input(s): TSH, T4TOTAL, FREET4, T3FREE, THYROIDAB in the last 72 hours. Anemia Panel: No results for input(s): VITAMINB12, FOLATE, FERRITIN, TIBC, IRON, RETICCTPCT in the last 72 hours. Urine analysis: No results found for: COLORURINE, APPEARANCEUR, LABSPEC, PHURINE, GLUCOSEU, HGBUR, BILIRUBINUR, KETONESUR, PROTEINUR, UROBILINOGEN, NITRITE, LEUKOCYTESUR Sepsis  Labs: @LABRCNTIP (procalcitonin:4,lacticidven:4) )No results found for this or any previous visit (from the past 240 hour(s)).   Radiological Exams on Admission: No results found.  EKG: Independently reviewed.  Assessment/Plan Active Problems:   Cellulitis of right lower leg   Cellulitis of RLE -  Failed clinda as outpatient, putting patient on vanc  BCx  Wound care consult   ESRD - left message with dialysis voicemail for routine inpatient dialysis  A.Fib - continue digoxin and coumadin   DVT prophylaxis: Coumadin Code Status: Full Family Communication: No family in room Consults called: Left voicemail with nephrology for routine inpatient dialysis Admission status: Admit to obs   Hillary BowGARDNER, JARED M. DO [facility] Pager [phone] from 7PM-7AM  If 7AM-7PM, please contact the day physician for the patient [email or web address] Password [email or web address]  11/03/2015, 10:45 PM

## 2015-11-04 ENCOUNTER — Encounter (HOSPITAL_COMMUNITY): Payer: Self-pay | Admitting: *Deleted

## 2015-11-04 DIAGNOSIS — N2581 Secondary hyperparathyroidism of renal origin: Secondary | ICD-10-CM | POA: Diagnosis present

## 2015-11-04 DIAGNOSIS — E8770 Fluid overload, unspecified: Secondary | ICD-10-CM | POA: Diagnosis present

## 2015-11-04 DIAGNOSIS — Z5181 Encounter for therapeutic drug level monitoring: Secondary | ICD-10-CM | POA: Diagnosis not present

## 2015-11-04 DIAGNOSIS — L03115 Cellulitis of right lower limb: Secondary | ICD-10-CM | POA: Diagnosis not present

## 2015-11-04 DIAGNOSIS — I4891 Unspecified atrial fibrillation: Secondary | ICD-10-CM | POA: Diagnosis not present

## 2015-11-04 DIAGNOSIS — L89152 Pressure ulcer of sacral region, stage 2: Secondary | ICD-10-CM | POA: Diagnosis not present

## 2015-11-04 DIAGNOSIS — F419 Anxiety disorder, unspecified: Secondary | ICD-10-CM | POA: Diagnosis present

## 2015-11-04 DIAGNOSIS — D631 Anemia in chronic kidney disease: Secondary | ICD-10-CM | POA: Diagnosis present

## 2015-11-04 DIAGNOSIS — L03119 Cellulitis of unspecified part of limb: Secondary | ICD-10-CM | POA: Diagnosis not present

## 2015-11-04 DIAGNOSIS — Z7901 Long term (current) use of anticoagulants: Secondary | ICD-10-CM | POA: Diagnosis not present

## 2015-11-04 DIAGNOSIS — N186 End stage renal disease: Secondary | ICD-10-CM | POA: Diagnosis present

## 2015-11-04 DIAGNOSIS — Q613 Polycystic kidney, unspecified: Secondary | ICD-10-CM | POA: Diagnosis not present

## 2015-11-04 DIAGNOSIS — Z992 Dependence on renal dialysis: Secondary | ICD-10-CM | POA: Diagnosis not present

## 2015-11-04 DIAGNOSIS — E785 Hyperlipidemia, unspecified: Secondary | ICD-10-CM | POA: Diagnosis present

## 2015-11-04 DIAGNOSIS — Z9114 Patient's other noncompliance with medication regimen: Secondary | ICD-10-CM | POA: Diagnosis not present

## 2015-11-04 DIAGNOSIS — R2689 Other abnormalities of gait and mobility: Secondary | ICD-10-CM | POA: Diagnosis not present

## 2015-11-04 DIAGNOSIS — I120 Hypertensive chronic kidney disease with stage 5 chronic kidney disease or end stage renal disease: Secondary | ICD-10-CM | POA: Diagnosis present

## 2015-11-04 DIAGNOSIS — I482 Chronic atrial fibrillation: Secondary | ICD-10-CM | POA: Diagnosis present

## 2015-11-04 DIAGNOSIS — M6281 Muscle weakness (generalized): Secondary | ICD-10-CM | POA: Diagnosis not present

## 2015-11-04 DIAGNOSIS — Z881 Allergy status to other antibiotic agents status: Secondary | ICD-10-CM | POA: Diagnosis not present

## 2015-11-04 DIAGNOSIS — Z6841 Body Mass Index (BMI) 40.0 and over, adult: Secondary | ICD-10-CM | POA: Diagnosis not present

## 2015-11-04 LAB — BASIC METABOLIC PANEL
ANION GAP: 18 — AB (ref 5–15)
BUN: 58 mg/dL — ABNORMAL HIGH (ref 6–20)
CO2: 21 mmol/L — ABNORMAL LOW (ref 22–32)
Calcium: 9.2 mg/dL (ref 8.9–10.3)
Chloride: 98 mmol/L — ABNORMAL LOW (ref 101–111)
Creatinine, Ser: 12.94 mg/dL — ABNORMAL HIGH (ref 0.44–1.00)
GFR calc Af Amer: 3 mL/min — ABNORMAL LOW (ref >60)
GFR, EST NON AFRICAN AMERICAN: 3 mL/min — AB (ref >60)
Glucose, Bld: 86 mg/dL (ref 65–99)
POTASSIUM: 5 mmol/L (ref 3.5–5.1)
SODIUM: 137 mmol/L (ref 135–145)

## 2015-11-04 LAB — CBC
HCT: 44.7 % (ref 36.0–46.0)
Hemoglobin: 13.9 g/dL (ref 12.0–15.0)
MCH: 29.8 pg (ref 26.0–34.0)
MCHC: 31.1 g/dL (ref 30.0–36.0)
MCV: 95.9 fL (ref 78.0–100.0)
PLATELETS: 153 10*3/uL (ref 150–400)
RBC: 4.66 MIL/uL (ref 3.87–5.11)
RDW: 15.3 % (ref 11.5–15.5)
WBC: 7 10*3/uL (ref 4.0–10.5)

## 2015-11-04 LAB — MRSA PCR SCREENING: MRSA BY PCR: NEGATIVE

## 2015-11-04 LAB — PROTIME-INR
INR: 2.96 — ABNORMAL HIGH (ref 0.00–1.49)
INR: 3.2 — AB (ref 0.00–1.49)
PROTHROMBIN TIME: 32.1 s — AB (ref 11.6–15.2)
Prothrombin Time: 30.3 seconds — ABNORMAL HIGH (ref 11.6–15.2)

## 2015-11-04 MED ORDER — HYDROCODONE-ACETAMINOPHEN 5-325 MG PO TABS
1.0000 | ORAL_TABLET | ORAL | Status: DC | PRN
  Administered 2015-11-04 – 2015-11-08 (×7): 1 via ORAL
  Filled 2015-11-04 (×7): qty 1

## 2015-11-04 MED ORDER — WARFARIN SODIUM 2 MG PO TABS
2.0000 mg | ORAL_TABLET | Freq: Once | ORAL | Status: AC
Start: 2015-11-04 — End: 2015-11-04
  Administered 2015-11-04: 2 mg via ORAL
  Filled 2015-11-04: qty 1

## 2015-11-04 MED ORDER — PIPERACILLIN-TAZOBACTAM IN DEX 2-0.25 GM/50ML IV SOLN
2.2500 g | Freq: Three times a day (TID) | INTRAVENOUS | Status: DC
  Administered 2015-11-04 – 2015-11-08 (×13): 2.25 g via INTRAVENOUS
  Filled 2015-11-04 (×17): qty 50

## 2015-11-04 MED ORDER — WARFARIN - PHARMACIST DOSING INPATIENT
Freq: Every day | Status: DC
  Administered 2015-11-04 – 2015-11-06 (×3)

## 2015-11-04 MED ORDER — DIPHENHYDRAMINE HCL 50 MG/ML IJ SOLN
25.0000 mg | Freq: Once | INTRAMUSCULAR | Status: AC
  Administered 2015-11-04: 25 mg via INTRAVENOUS
  Filled 2015-11-04: qty 1

## 2015-11-04 MED ORDER — VANCOMYCIN HCL IN DEXTROSE 1-5 GM/200ML-% IV SOLN
1000.0000 mg | Freq: Once | INTRAVENOUS | Status: AC
Start: 2015-11-04 — End: 2015-11-04
  Administered 2015-11-04: 1000 mg via INTRAVENOUS
  Filled 2015-11-04: qty 200

## 2015-11-04 MED ORDER — DOXERCALCIFEROL 4 MCG/2ML IV SOLN
10.0000 ug | INTRAVENOUS | Status: DC
  Administered 2015-11-05: 10 ug via INTRAVENOUS
  Filled 2015-11-04: qty 6

## 2015-11-04 MED ORDER — VANCOMYCIN HCL IN DEXTROSE 1-5 GM/200ML-% IV SOLN
1000.0000 mg | INTRAVENOUS | Status: DC | PRN
  Filled 2015-11-04: qty 200

## 2015-11-04 NOTE — Consult Note (Signed)
[facility] Renal Consultation Note  Indication for Consultation:  Management of ESRD/hemodialysis; anemia, hypertension/volume and secondary hyperparathyroidism  HPI: [name] Bender is a 60 y.o. female admitted with Right lower leg cellulitis has ho ESRD on  HD  ([facility] TTS, HO noncompliance with txs attendance) missed past Sat HD tx 5/06 , no hd on 5/02 .  Had hd on  4/29 and 10/24/15 txs missed 47mn tx times.She reports Increasing erythema, pain, skin breakage with clear oozing discharge from leg . Note was seen here on 4/29 for same complaint and treated with clindamycin as outpatient. She denies fevers, chills , sob, chest pain, abdominal pain( has Massive Ventral Hernia ),denying  dizziness,or problems using her Right fem AVGG at Hemodialysis. She denise any recent bleeding prob on Coumadin for A. Fib.    Unfortunatley / recent family health stresses = [place] and this weekend Husband "had massive heart attack in [place] now".     Past Medical History  Diagnosis Date  . Atrial fibrillation ([place]   . Hypercholesterolemia   . HTN (hypertension)   . Polycystic kidney disease   . Supraclinoid carotid artery aneurysm, small ([place]     Past Surgical History  Procedure Laterality Date  . Thrombectomy      of right upper arm ateriovenous graft with revision   . Rivght ovary removal      secondary to a cyst  . C-sections      x2  . Appendectomy    . Total knee arthroplasty  feb 2005    right knee  . Right oophorectomy        Family History  Problem Relation Age of Onset  . Stroke Mother   . Atrial fibrillation Father       reports that she has never smoked. She does not have any smokeless tobacco history on file. She reports that she drinks alcohol. Her drug history is not on file.   Allergies  Allergen Reactions  . Ancef [Cefazolin Sodium] Itching    Prior to Admission medications    Medication Sig Start Date End Date Taking? Authorizing Provider  calcium acetate (PHOSLO) 667 MG capsule Take 667 mg by mouth 3 (three) times daily with meals.     Yes Historical Provider, MD  cinacalcet (SENSIPAR) 30 MG tablet Take 30 mg by mouth daily.     Yes Historical Provider, MD  digoxin (LANOXIN) 0.125 MG tablet TAKE 1 TABLET (125 MCG TOTAL) BY MOUTH 2 (TWO) TIMES A WEEK. ON TUESDAY AND FRIDAY 09/02/15  Yes BLelon Perla MD  fenofibrate (TRICOR) 145 MG tablet Take 145 mg by mouth daily.     Yes Historical Provider, MD  metoprolol (LOPRESSOR) 100 MG tablet Take 1 tablet (100 mg total) by mouth daily. 09/11/15  Yes BLelon Perla MD  sevelamer (RENVELA) 800 MG tablet Take 2,400 mg by mouth 3 (three) times daily before meals.    Yes Historical Provider, MD  warfarin (COUMADIN) 5 MG tablet Take 1 tablet by mouth daily or as directed by coumadin clinic Patient taking differently: Take 2.5-5 mg by mouth See admin instructions. Take 1 tablet on Wednesday then take 1/2 tablet all the other days 10/24/15  Yes BLelon Perla MD     Anti-infectives    Start     Dose/Rate Route Frequency Ordered Stop   11/03/15 2300  clindamycin (CLEOCIN) IVPB 600 mg  Status:  Discontinued     600 mg 100 mL/hr over 30 Minutes Intravenous Every 8 hours 11/03/15 2245 11/03/15 2247   11/03/15 2300  vancomycin (VANCOCIN) 2,000 mg in sodium chloride 0.9 % 500 mL IVPB     2,000 mg 250 mL/hr over 120 Minutes Intravenous  Once 11/03/15 2249 11/04/15 0139   11/03/15 2245  piperacillin-tazobactam (ZOSYN) IVPB 3.375 g     3.375 g 100 mL/hr over 30 Minutes Intravenous  Once 11/03/15 2238 11/03/15 2306   11/03/15 2145  vancomycin (VANCOCIN) IVPB 1000 mg/200 mL premix  Status:  Discontinued     1,000 mg 200 mL/hr over 60 Minutes Intravenous  Once 11/03/15 2139 11/03/15 2249   11/03/15 2145  piperacillin-tazobactam (ZOSYN) IVPB 3.375 g  Status:  Discontinued     3.375 g 12.5 mL/hr over 240 Minutes Intravenous  Once  11/03/15 2142 11/03/15 2238      Results for orders placed or performed during the hospital encounter of 11/03/15 (from the past 48 hour(s))  CBC with Differential     Status: None   Collection Time: 11/03/15  7:23 PM  Result Value Ref Range   WBC 5.7 4.0 - 10.5 K/uL   RBC 4.66 3.87 - 5.11 MIL/uL   Hemoglobin 14.4 12.0 - 15.0 g/dL   HCT 45.4 36.0 - 46.0 %   MCV 97.4 78.0 - 100.0 fL   MCH 30.9 26.0 - 34.0 pg   MCHC 31.7 30.0 - 36.0 g/dL   RDW 15.1 11.5 - 15.5 %   Platelets 150 150 - 400 K/uL   Neutrophils Relative % 70 %   Neutro Abs 4.0 1.7 - 7.7 K/uL   Lymphocytes Relative 18 %   Lymphs Abs 1.1 0.7 - 4.0 K/uL   Monocytes Relative 10 %   Monocytes Absolute 0.6 0.1 - 1.0 K/uL   Eosinophils Relative 2 %   Eosinophils Absolute 0.1 0.0 - 0.7 K/uL   Basophils Relative 0 %   Basophils Absolute 0.0 0.0 - 0.1 K/uL  Comprehensive metabolic panel     Status: Abnormal   Collection Time: 11/03/15  7:23 PM  Result Value Ref Range   Sodium 139 135 - 145 mmol/L   Potassium 4.8 3.5 - 5.1 mmol/L   Chloride 98 (L) 101 - 111 mmol/L   CO2 24 22 - 32 mmol/L   Glucose, Bld 84 65 - 99 mg/dL   BUN 53 (H) 6 - 20 mg/dL   Creatinine, Ser 12.63 (H) 0.44 - 1.00 mg/dL   Calcium 9.6 8.9 - 10.3 mg/dL   Total Protein 7.1 6.5 - 8.1 g/dL   Albumin 3.2 (L) 3.5 - 5.0 g/dL   AST 16 15 - 41 U/L   ALT 12 (L) 14 - 54 U/L   Alkaline Phosphatase 61 38 - 126 U/L   Total Bilirubin 0.8 0.3 - 1.2 mg/dL   GFR calc non Af Amer 3 (L) >60 mL/min   GFR calc Af Amer 3 (L) >60 mL/min    Comment: (NOTE) The eGFR has been calculated using the CKD EPI equation. This calculation has not been validated in all clinical situations. eGFR's persistently <60 mL/min signify possible Chronic Kidney Disease.    Anion gap 17 (H) 5 - 15  Protime-INR     Status: Abnormal   Collection Time: 11/03/15 11:35 PM  Result Value Ref Range   Prothrombin Time 32.1 (H) 11.6 - 15.2 seconds   INR 3.20 (H) 0.00 - 1.49  MRSA PCR Screening  Status: None   Collection Time: 11/03/15 11:35 PM  Result Value Ref Range   MRSA by PCR NEGATIVE NEGATIVE    Comment:        The GeneXpert MRSA Assay (FDA approved for NASAL specimens only), is one component of a comprehensive MRSA colonization surveillance program. It is not intended to diagnose MRSA infection nor to guide or monitor treatment for MRSA infections.   Culture, blood (Routine X 2) w Reflex to ID Panel     Status: None (Preliminary result)   Collection Time: 11/03/15 11:48 PM  Result Value Ref Range   Specimen Description BLOOD BLOOD LEFT ARM    Special Requests BOTTLES DRAWN AEROBIC AND ANAEROBIC 5CC    Culture PENDING    Report Status PENDING   CBC     Status: None   Collection Time: 11/04/15  5:56 AM  Result Value Ref Range   WBC 7.0 4.0 - 10.5 K/uL   RBC 4.66 3.87 - 5.11 MIL/uL   Hemoglobin 13.9 12.0 - 15.0 g/dL   HCT 44.7 36.0 - 46.0 %   MCV 95.9 78.0 - 100.0 fL   MCH 29.8 26.0 - 34.0 pg   MCHC 31.1 30.0 - 36.0 g/dL   RDW 15.3 11.5 - 15.5 %   Platelets 153 150 - 400 K/uL  Basic metabolic panel     Status: Abnormal   Collection Time: 11/04/15  5:56 AM  Result Value Ref Range   Sodium 137 135 - 145 mmol/L   Potassium 5.0 3.5 - 5.1 mmol/L   Chloride 98 (L) 101 - 111 mmol/L   CO2 21 (L) 22 - 32 mmol/L   Glucose, Bld 86 65 - 99 mg/dL   BUN 58 (H) 6 - 20 mg/dL   Creatinine, Ser 12.94 (H) 0.44 - 1.00 mg/dL   Calcium 9.2 8.9 - 10.3 mg/dL   GFR calc non Af Amer 3 (L) >60 mL/min   GFR calc Af Amer 3 (L) >60 mL/min    Comment: (NOTE) The eGFR has been calculated using the CKD EPI equation. This calculation has not been validated in all clinical situations. eGFR's persistently <60 mL/min signify possible Chronic Kidney Disease.    Anion gap 18 (H) 5 - 15  Protime-INR     Status: Abnormal   Collection Time: 11/04/15  5:56 AM  Result Value Ref Range   Prothrombin Time 30.3 (H) 11.6 - 15.2 seconds   INR 2.96 (H) 0.00 - 1.49     ROS: see above   hpi  Physical Exam: Filed Vitals:   11/03/15 2331 11/04/15 0417  BP: 135/56 124/68  Pulse: 103 110  Temp: 97.4 F (36.3 C) 97.5 F (36.4 C)  Resp: 19 20     General: Alert Obese WF NAD  HEENT: [place] but some facial edema ,MMM  Neck: supple ,no jvd Heart: Irreg, irreg with VR  90s ,no rub or gallopor mur Lungs: CTA , nonlabored breathing Abdomen: Obese soft with Large Ventral hernia ,nontender, BS poa. Extremities: RLE venous stasis  Skin changes  With clear weeping dc 2 to 3 + pedal edema Skin:  As above RLE  No overt rash Neuro: alert , no acute focal deficits noted  Dialysis Access:  Pos bruit R Fem AVGG  Dialysis Orders: [facility]: [facility]   on TTS . EDW 107 (has been at 106.7 past week) HD Bath @k , 2.25ca  Time 4 hr Heparin 11,000. Access  R fem AVGG      Hectorol 10 mcg  IV/HD      Other  Op labs= HGB 13.1  10/31/15   Ca 10/ Phos 10.7   And pth 1036  10/24/15  Assessment/Plan 1. RLE Cellulitis  = per admit team on Iv Vanco. 2. ESRD -  HD today and  tomor to keep on Schedule / needs vol removed  Will help cellulitis Dx some  3. Hypertension/volume  - bp stable / on metop as op (A. Fib) / below edw  by admit wt / needs vol uf with hd  And lower edw at dc  4. Anemia  - no esa or fe as op  Monitor HGB/ hgb 13.9 5. Metabolic bone disease -  As op noncompliant with binder  Renvela and sensipar / Vit d iv on HD tts 6. Nutrition -  Renal diet , vit . 7. Ho A. Fib - on coumadin / Digoxin / Metoprol  Per admit  Team rx   Ernest Haber, PA-C [facility] [phone] 11/04/2015, 8:13 AM

## 2015-11-04 NOTE — Progress Notes (Signed)
PROGRESS NOTE                                                                                                                                                                                                             Patient Demographics:    [name] Bender, is a 60 y.o. female, DOB - June 24, 1956, HYQ:[id number]  Admit date - 11/03/2015   Admitting Physician Hillary Bow, DO  Outpatient Primary MD for the patient is Dyke Maes, MD  LOS - 0  Outpatient Specialists: Dr. Marjory Sneddon nephrologist  Chief Complaint  Patient presents with  . Cellulitis       Brief Narrative  [name] Bender is a 60 y.o. female with medical history significant of ESRD dialysis TTS, missed 2023-06-06 dialysis Due to recent death of her son and his funeral on that day. Patient presented to the ED with c/o RLE erythema, pain, skin breakage. She was seen here on 4/29 for same complaint. Treated with clindamycin as outpatient. Unfortunatly this failed and drainage, erythema, etc has persisted. She was admitted for right lower extremity cellulitis.      Subjective:    [name] Bender today has, No headache, No chest pain, No abdominal pain - No Nausea, No new weakness tingling or numbness, No Cough - SOB. Mild Right lower extremity pain.   Assessment  & Plan :     1.Right lower extremity cellulitis with evidence of fluid overload. Monitor cultures, agree with empiric IV antibiotics which are vancomycin and Zosyn, she has failed outpatient clindamycin treatment. Appears nontoxic and not septic. Dialysis for fluid removal.  2. ESRD. On Tuesday, Thursday and 06-06-23 schedule. Missed last run due to son's funeral, renal consulted will be dialyzed.  3. Chronic atrial fibrillation. [place] vasc 2 score of 2. Continue Lopressor, digoxin and Coumadin, pharmacy on board.  4. Essential hypertension. Continue beta blocker.  5. Morbid obesity.  Follow PCP.  6. Dyslipidemia. On [facility] continue.    Code Status : Full  Family Communication  : None  Disposition Plan  : Home 3-4 days  Barriers For Discharge : R.Leg cellulitis  Consults  : Renal  Procedures  :  None  DVT Prophylaxis  :  Coumadin'  Lab Results  Component Value Date   INR 2.96* 11/04/2015   INR 3.20* 11/03/2015   INR 2.9* 10/24/2015  Lab Results  Component Value Date   PLT 153 11/04/2015    Antibiotics  :     Anti-infectives    Start     Dose/Rate Route Frequency Ordered Stop   11/03/15 2300  clindamycin (CLEOCIN) IVPB 600 mg  Status:  Discontinued     600 mg 100 mL/hr over 30 Minutes Intravenous Every 8 hours 11/03/15 2245 11/03/15 2247   11/03/15 2300  vancomycin (VANCOCIN) 2,000 mg in sodium chloride 0.9 % 500 mL IVPB     2,000 mg 250 mL/hr over 120 Minutes Intravenous  Once 11/03/15 2249 11/04/15 0139   11/03/15 2245  piperacillin-tazobactam (ZOSYN) IVPB 3.375 g     3.375 g 100 mL/hr over 30 Minutes Intravenous  Once 11/03/15 2238 11/03/15 2306   11/03/15 2145  vancomycin (VANCOCIN) IVPB 1000 mg/200 mL premix  Status:  Discontinued     1,000 mg 200 mL/hr over 60 Minutes Intravenous  Once 11/03/15 2139 11/03/15 2249   11/03/15 2145  piperacillin-tazobactam (ZOSYN) IVPB 3.375 g  Status:  Discontinued     3.375 g 12.5 mL/hr over 240 Minutes Intravenous  Once 11/03/15 2142 11/03/15 2238        Objective:   Filed Vitals:   11/03/15 2245 11/03/15 2331 11/04/15 0417 11/04/15 1002  BP: 118/91 135/56 124/68 125/59  Pulse: 90 103 110 78  Temp:  97.4 F (36.3 C) 97.5 F (36.4 C) 97.3 F (36.3 C)  TempSrc:  Oral Oral Oral  Resp: 20 19 20 18   Height:  5\' 3"  (1.6 m)    Weight:  105.87 kg (233 lb 6.4 oz)    SpO2: 96% 92% 96% 94%    Wt Readings from Last 3 Encounters:  11/03/15 105.87 kg (233 lb 6.4 oz)  10/26/15 106.5 kg (234 lb 12.6 oz)  04/04/14 108.773 kg (239 lb 12.8 oz)     Intake/Output Summary (Last 24 hours) at 11/04/15  1049 Last data filed at 11/04/15 1000  Gross per 24 hour  Intake    220 ml  Output      0 ml  Net    220 ml     Physical Exam  Awake Alert, Oriented X 3, No new F.N deficits, Normal affect [place].AT,PERRAL Supple Neck,No JVD, No cervical lymphadenopathy appriciated.  Symmetrical Chest wall movement, Good air movement bilaterally, CTAB RRR,No Gallops,Rubs or new Murmurs, No Parasternal Heave +ve B.Sounds, Abd Soft, No tenderness, No organomegaly appriciated, No rebound - guarding or rigidity. No Cyanosis, Clubbing or edema, No new Rash or bruise R leg swollen, red and warm, serous discharge    Data Review:    CBC  Recent Labs Lab 11/03/15 1923 11/04/15 0556  WBC 5.7 7.0  HGB 14.4 13.9  HCT 45.4 44.7  PLT 150 153  MCV 97.4 95.9  MCH 30.9 29.8  MCHC 31.7 31.1  RDW 15.1 15.3  LYMPHSABS 1.1  --   MONOABS 0.6  --   EOSABS 0.1  --   BASOSABS 0.0  --     Chemistries   Recent Labs Lab 11/03/15 1923 11/04/15 0556  NA 139 137  K 4.8 5.0  CL 98* 98*  CO2 24 21*  GLUCOSE 84 86  BUN 53* 58*  CREATININE 12.63* 12.94*  CALCIUM 9.6 9.2  AST 16  --   ALT 12*  --   ALKPHOS 61  --   BILITOT 0.8  --    ------------------------------------------------------------------------------------------------------------------ No results for input(s): CHOL, HDL, LDLCALC, TRIG, CHOLHDL, LDLDIRECT in the last 72  hours.  No results found for: HGBA1C ------------------------------------------------------------------------------------------------------------------ No results for input(s): TSH, T4TOTAL, T3FREE, THYROIDAB in the last 72 hours.  Invalid input(s): FREET3 ------------------------------------------------------------------------------------------------------------------ No results for input(s): VITAMINB12, FOLATE, FERRITIN, TIBC, IRON, RETICCTPCT in the last 72 hours.  Coagulation profile  Recent Labs Lab 11/03/15 2335 11/04/15 0556  INR 3.20* 2.96*    No results  for input(s): DDIMER in the last 72 hours.  Cardiac Enzymes No results for input(s): CKMB, TROPONINI, MYOGLOBIN in the last 168 hours.  Invalid input(s): CK ------------------------------------------------------------------------------------------------------------------ No results found for: BNP  Inpatient Medications  Scheduled Meds: . calcium acetate  667 mg Oral TID WC  . cinacalcet  30 mg Oral Q breakfast  . [START ON 11/05/2015] digoxin  0.125 mg Oral Once per day on Tue Fri  . fenofibrate  160 mg Oral Daily  . metoprolol  100 mg Oral Daily  . sevelamer carbonate  2,400 mg Oral TID AC   Continuous Infusions:  PRN Meds:.  Micro Results Recent Results (from the past 240 hour(s))  MRSA PCR Screening     Status: None   Collection Time: 11/03/15 11:35 PM  Result Value Ref Range Status   MRSA by PCR NEGATIVE NEGATIVE Final    Comment:        The GeneXpert MRSA Assay (FDA approved for NASAL specimens only), is one component of a comprehensive MRSA colonization surveillance program. It is not intended to diagnose MRSA infection nor to guide or monitor treatment for MRSA infections.   Culture, blood (Routine X 2) w Reflex to ID Panel     Status: None (Preliminary result)   Collection Time: 11/03/15 11:48 PM  Result Value Ref Range Status   Specimen Description BLOOD BLOOD LEFT ARM  Final   Special Requests BOTTLES DRAWN AEROBIC AND ANAEROBIC 5CC  Final   Culture PENDING  Incomplete   Report Status PENDING  Incomplete    Radiology Reports No results found.  Time Spent in minutes  30   [name] Bender K M.D on 11/04/2015 at 10:49 AM  Between 7am to 7pm - Pager - [phone]  After 7pm go to [email or web address] - password [facility]  [facility] -  Office  [phone]

## 2015-11-04 NOTE — Progress Notes (Signed)
ANTICOAGULATION AND ANTIBIOTIC CONSULT NOTE  Pharmacy Consult for Coumadin, Zosyn, and Vancomycin Indication: atrial fibrillation and cellulitis  Allergies  Allergen Reactions  . Ancef [Cefazolin Sodium] Itching    Patient Measurements: Height: 5\' 3"  (160 cm) Weight: 233 lb 6.4 oz (105.87 kg) IBW/kg (Calculated) : 52.4  Vital Signs: Temp: 97.3 F (36.3 C) (05/08 1002) Temp Source: Oral (05/08 1002) BP: 125/59 mmHg (05/08 1002) Pulse Rate: 78 (05/08 1002)  Labs:  Recent Labs  11/03/15 1923 11/03/15 2335 11/04/15 0556  HGB 14.4  --  13.9  HCT 45.4  --  44.7  PLT 150  --  153  LABPROT  --  32.1* 30.3*  INR  --  3.20* 2.96*  CREATININE 12.63*  --  12.94*    Estimated Creatinine Clearance: 5.3 mL/min (by C-G formula based on Cr of 12.94).   Medical History: Past Medical History  Diagnosis Date  . Atrial fibrillation (HCC)   . Hypercholesterolemia   . HTN (hypertension)   . Polycystic kidney disease   . Supraclinoid carotid artery aneurysm, small (HCC)     Medications:  See electronic med rec  Assessment: [age] y.o. F presents with cellulitis.   AC: Home dose of coumadin 5mg  on Wed and 2.5mg  all other days. Last dose 5/6. CBC ok. INR 2.96 today  ID: Vanc/Zosyn for RLE cellulitis with erythema/drainage; failed o/p treatment of Clinda. WBC wnl.   5/7 Vanc> 5/7 Zosyn >  5/7 BC x 2>>pending 5/7 MRSA PCR negative  Nephro: ESRD - HD (T/T/S) - pt missed Sat HD. Plan to make up today and resume schedule tomorrow  Goal of Therapy:  INR 2-3 Monitor platelets by anticoagulation protocol: Yes   Plan:  Give warfarin 2 mg x 1 today Monitor daily INR, CBC, clinical course, s/sx of bleed, PO intake, DDI  Vanc 1gm IV post-HD today and then plan for post-HD Tues, Thurs, Sat Will f/u HD tolerance for continued doses Start Zosyn 2.25 g IV q 8 hours  Thank you for allowing us to participate in this patients care. Signe Coltonya C Tana Trefry, PharmD Pager:  [phone] 11/04/2015,10:48 AM

## 2015-11-04 NOTE — Consult Note (Addendum)
WOC wound consult note Reason for Consult: Consult requested for right leg cellulitis.  Previous edema and weeping has evolved into patchy areas of dry scabs and dry loose peeling skin to wound edges. Wound type: partial thickness skin loss in patchy areas; approx 14X20cm Wound bed: Dry yellow scabbed wound bed Drainage (amount, consistency, odor) No odor or drainage at this time Periwound: Generalized edema and erythremia to RLE Dressing procedure/placement/frequency: Pt could benefit from home health assistance after discharge for dressing change assistance; please order if desired.  Xeroform to promote moist healing; ace wrap for light compression to reduce edema.  Discussed plan of care with patient and she verbalized understanding. Please re-consult if further assistance is needed.  Thank-you,  Cammie Mcgeeawn Paul Trettin MSN, RN, CWOCN, PrenticeWCN-AP, CNS [phone]

## 2015-11-05 LAB — RENAL FUNCTION PANEL
ANION GAP: 15 (ref 5–15)
Albumin: 2.6 g/dL — ABNORMAL LOW (ref 3.5–5.0)
BUN: 25 mg/dL — ABNORMAL HIGH (ref 6–20)
CHLORIDE: 97 mmol/L — AB (ref 101–111)
CO2: 27 mmol/L (ref 22–32)
Calcium: 9.5 mg/dL (ref 8.9–10.3)
Creatinine, Ser: 8.03 mg/dL — ABNORMAL HIGH (ref 0.44–1.00)
GFR calc non Af Amer: 5 mL/min — ABNORMAL LOW (ref >60)
GFR, EST AFRICAN AMERICAN: 6 mL/min — AB (ref >60)
Glucose, Bld: 86 mg/dL (ref 65–99)
Phosphorus: 6.8 mg/dL — ABNORMAL HIGH (ref 2.5–4.6)
Potassium: 4.2 mmol/L (ref 3.5–5.1)
Sodium: 139 mmol/L (ref 135–145)

## 2015-11-05 LAB — PROTIME-INR
INR: 2.45 — AB (ref 0.00–1.49)
Prothrombin Time: 26.3 seconds — ABNORMAL HIGH (ref 11.6–15.2)

## 2015-11-05 LAB — CBC
HCT: 40.8 % (ref 36.0–46.0)
HEMOGLOBIN: 12.8 g/dL (ref 12.0–15.0)
MCH: 30 pg (ref 26.0–34.0)
MCHC: 31.4 g/dL (ref 30.0–36.0)
MCV: 95.6 fL (ref 78.0–100.0)
PLATELETS: 124 10*3/uL — AB (ref 150–400)
RBC: 4.27 MIL/uL (ref 3.87–5.11)
RDW: 15.4 % (ref 11.5–15.5)
WBC: 5.7 10*3/uL (ref 4.0–10.5)

## 2015-11-05 MED ORDER — DOXERCALCIFEROL 4 MCG/2ML IV SOLN
INTRAVENOUS | Status: AC
  Filled 2015-11-05: qty 6

## 2015-11-05 MED ORDER — WARFARIN SODIUM 2.5 MG PO TABS
2.5000 mg | ORAL_TABLET | Freq: Once | ORAL | Status: AC
  Administered 2015-11-05: 2.5 mg via ORAL
  Filled 2015-11-05: qty 1

## 2015-11-05 MED ORDER — VANCOMYCIN HCL IN DEXTROSE 1-5 GM/200ML-% IV SOLN
1000.0000 mg | INTRAVENOUS | Status: DC
  Administered 2015-11-05 – 2015-11-07 (×3): 1000 mg via INTRAVENOUS
  Filled 2015-11-05 (×2): qty 200

## 2015-11-05 MED ORDER — HYDRALAZINE HCL 20 MG/ML IJ SOLN: 10.0000 mg | Freq: Four times a day (QID) | INTRAMUSCULAR | Status: DC | PRN

## 2015-11-05 NOTE — Progress Notes (Signed)
ANTICOAGULATION AND ANTIBIOTIC CONSULT NOTE  Pharmacy Consult for Coumadin, Zosyn, and Vancomycin Indication: atrial fibrillation and cellulitis  Allergies  Allergen Reactions  . Ancef [Cefazolin Sodium] Itching    Patient Measurements: Height: 5\' 3"  (160 cm) Weight: 237 lb 3.4 oz (107.6 kg) IBW/kg (Calculated) : 52.4  Vital Signs: Temp: 97.8 F (36.6 C) (05/09 0509) Temp Source: Oral (05/09 0509) BP: 94/48 mmHg (05/09 0509) Pulse Rate: 56 (05/09 0509)  Labs:  Recent Labs  11/03/15 1923 11/03/15 2335 11/04/15 0556 11/05/15 0714  HGB 14.4  --  13.9  --   HCT 45.4  --  44.7  --   PLT 150  --  153  --   LABPROT  --  32.1* 30.3* 26.3*  INR  --  3.20* 2.96* 2.45*  CREATININE 12.63*  --  12.94*  --     Estimated Creatinine Clearance: 5.4 mL/min (by C-G formula based on Cr of 12.94).   Medical History: Past Medical History  Diagnosis Date  . Atrial fibrillation (HCC)   . Hypercholesterolemia   . HTN (hypertension)   . Polycystic kidney disease   . Supraclinoid carotid artery aneurysm, small (HCC)     Medications:  See electronic med rec  Assessment: 60 y.o. F presents with cellulitis.   AC: Home dose of coumadin 5mg  on Wed and 2.5mg  all other days. Last dose 5/6. CBC ok. INR 2.45 today, PO intake is low  ID: Vanc/Zosyn for RLE cellulitis with erythema/drainage; failed o/p treatment of Clinda. WBC wnl.   5/7 Vanc> 5/7 Zosyn >  5/7 BC x 2>>pending 5/7 MRSA PCR negative  Nephro: ESRD - HD (T/T/S) - pt missed Sat HD. Pt tolerated full HD session Monday and plan to resume schedule today  Goal of Therapy:  INR 2-3 Monitor platelets by anticoagulation protocol: Yes   Plan:  Give warfarin 2.5 mg x 1 today Monitor daily INR, CBC, clinical course, s/sx of bleed, PO intake, DDI  Start Vanc 1gm IV post-HD Tues, Thurs, Sat Continue Zosyn 2.25 g IV q 8 hours  Thank you for allowing us to participate in this patients care. Signe Coltonya C Antwine Agosto, PharmD Pager:  [phone] 11/05/2015,10:25 AM

## 2015-11-05 NOTE — Consult Note (Signed)
WOC wound consult note Reason for Consult: ? DTI buttock Wound type: chronic skin changes Wound NWG:[id number]:blanchable red skin in the gluteal cleft and over the buttocks, 1 area noted on the right buttock that is darker but appears to be chronic in nature.  Drainage (amount, consistency, odor) none Periwound: intact  Dressing procedure/placement/frequency: No topical care at this time.   Discussed POC with patient and bedside nurse.  Re consult if needed, will not follow at this time. Thanks  Dilynn Munroe [employer] RN, CWOCN [phone])

## 2015-11-05 NOTE — Progress Notes (Signed)
PROGRESS NOTE                                                                                                                                                                                                             Patient Demographics:    [name] Bender, is a 60 y.o. female, DOB - 1956-02-04, ZHY:[id number]  Admit date - 11/03/2015   Admitting Physician Hillary Bow, DO  Outpatient Primary MD for the patient is Dyke Maes, MD  LOS - 1  Outpatient Specialists: Dr. Marjory Sneddon nephrologist  Chief Complaint  Patient presents with  . Cellulitis       Brief Narrative  [name] Bender is a 60 y.o. female with medical history significant of ESRD dialysis TTS, missed Dec 01, 2022 dialysis Due to recent death of her son and his funeral on that day. Patient presented to the ED with c/o RLE erythema, pain, skin breakage. She was seen here on 4/29 for same complaint. Treated with clindamycin as outpatient. Unfortunatly this failed and drainage, erythema, etc has persisted. She was admitted for right lower extremity cellulitis.    Subjective:    Clea Dubach today has, No headache, No chest pain, No abdominal pain - No Nausea, No new weakness tingling or numbness, No Cough - SOB. Mild Right lower extremity pain.   Assessment  & Plan :     1.Right lower extremity cellulitis with evidence of fluid overload. Monitor cultures, agree with empiric IV antibiotics which are vancomycin and Zosyn, she has failed outpatient clindamycin treatment. Appears nontoxic and not septic. Dialysis for fluid removal.  2. ESRD. On Tuesday, Thursday and Dec 01, 2022 schedule. Missed last run due to son's funeral, renal consulted will be dialyzed.  3. Chronic atrial fibrillation. [place] vasc 2 score of 2. Continue Lopressor, digoxin and Coumadin, pharmacy on board. INR therapeutic.  4. Essential hypertension. Continue beta blocker and added  as needed hydralazine.  5. Morbid obesity. Follow PCP.  6. Dyslipidemia. On [facility] continue.    Code Status : Full  Family Communication  : None  Disposition Plan  : Home 3-4 days  Barriers For Discharge : R.Leg cellulitis  Consults  : Renal  Procedures  :  None  DVT Prophylaxis  :  Coumadin  Lab Results  Component Value Date   INR 2.45* 11/05/2015   INR 2.96* 11/04/2015  INR 3.20* 11/03/2015     Lab Results  Component Value Date   PLT 153 11/04/2015    Antibiotics  :     Anti-infectives    Start     Dose/Rate Route Frequency Ordered Stop   11/05/15 1800  vancomycin (VANCOCIN) IVPB 1000 mg/200 mL premix     1,000 mg 200 mL/hr over 60 Minutes Intravenous Every T-Th-Sa (Hemodialysis) 11/05/15 1021     11/04/15 1800  vancomycin (VANCOCIN) IVPB 1000 mg/200 mL premix  Status:  Discontinued     1,000 mg 200 mL/hr over 60 Minutes Intravenous Every Dialysis 11/04/15 1100 11/04/15 1106   11/04/15 1800  vancomycin (VANCOCIN) IVPB 1000 mg/200 mL premix     1,000 mg 200 mL/hr over 60 Minutes Intravenous Once in dialysis 11/04/15 1106 11/04/15 1805   11/04/15 1200  piperacillin-tazobactam (ZOSYN) IVPB 2.25 g     2.25 g 100 mL/hr over 30 Minutes Intravenous Every 8 hours 11/04/15 1059     11/03/15 2300  clindamycin (CLEOCIN) IVPB 600 mg  Status:  Discontinued     600 mg 100 mL/hr over 30 Minutes Intravenous Every 8 hours 11/03/15 2245 11/03/15 2247   11/03/15 2300  vancomycin (VANCOCIN) 2,000 mg in sodium chloride 0.9 % 500 mL IVPB     2,000 mg 250 mL/hr over 120 Minutes Intravenous  Once 11/03/15 2249 11/04/15 0139   11/03/15 2245  piperacillin-tazobactam (ZOSYN) IVPB 3.375 g     3.375 g 100 mL/hr over 30 Minutes Intravenous  Once 11/03/15 2238 11/03/15 2306   11/03/15 2145  vancomycin (VANCOCIN) IVPB 1000 mg/200 mL premix  Status:  Discontinued     1,000 mg 200 mL/hr over 60 Minutes Intravenous  Once 11/03/15 2139 11/03/15 2249   11/03/15 2145   piperacillin-tazobactam (ZOSYN) IVPB 3.375 g  Status:  Discontinued     3.375 g 12.5 mL/hr over 240 Minutes Intravenous  Once 11/03/15 2142 11/03/15 2238        Objective:   Filed Vitals:   11/04/15 1816 11/04/15 2012 11/05/15 0509 11/05/15 0900  BP: 126/86 106/64 94/48 137/104  Pulse: 95 85 56 89  Temp: 97.9 F (36.6 C) 98.2 F (36.8 C) 97.8 F (36.6 C) 97.6 F (36.4 C)  TempSrc: Oral Oral Oral Oral  Resp: 24 21 20    Height:      Weight:      SpO2:  99% 94% 98%    Wt Readings from Last 3 Encounters:  11/04/15 107.6 kg (237 lb 3.4 oz)  10/26/15 106.5 kg (234 lb 12.6 oz)  04/04/14 108.773 kg (239 lb 12.8 oz)     Intake/Output Summary (Last 24 hours) at 11/05/15 1124 Last data filed at 11/05/15 0900  Gross per 24 hour  Intake    730 ml  Output   3500 ml  Net  -2770 ml     Physical Exam  Awake Alert, Oriented X 3, No new F.N deficits, Normal affect [place].AT,PERRAL Supple Neck,No JVD, No cervical lymphadenopathy appriciated.  Symmetrical Chest wall movement, Good air movement bilaterally, CTAB RRR,No Gallops,Rubs or new Murmurs, No Parasternal Heave +ve B.Sounds, Abd Soft, No tenderness, No organomegaly appriciated, No rebound - guarding or rigidity. No Cyanosis, Clubbing or edema, No new Rash or bruise R leg swollen, red and warm, serous discharge    Data Review:    CBC  Recent Labs Lab 11/03/15 1923 11/04/15 0556  WBC 5.7 7.0  HGB 14.4 13.9  HCT 45.4 44.7  PLT 150 153  MCV 97.4 95.9  MCH 30.9 29.8  MCHC 31.7 31.1  RDW 15.1 15.3  LYMPHSABS 1.1  --   MONOABS 0.6  --   EOSABS 0.1  --   BASOSABS 0.0  --     Chemistries   Recent Labs Lab 11/03/15 1923 11/04/15 0556  NA 139 137  K 4.8 5.0  CL 98* 98*  CO2 24 21*  GLUCOSE 84 86  BUN 53* 58*  CREATININE 12.63* 12.94*  CALCIUM 9.6 9.2  AST 16  --   ALT 12*  --   ALKPHOS 61  --   BILITOT 0.8  --     ------------------------------------------------------------------------------------------------------------------ No results for input(s): CHOL, HDL, LDLCALC, TRIG, CHOLHDL, LDLDIRECT in the last 72 hours.  No results found for: HGBA1C ------------------------------------------------------------------------------------------------------------------ No results for input(s): TSH, T4TOTAL, T3FREE, THYROIDAB in the last 72 hours.  Invalid input(s): FREET3 ------------------------------------------------------------------------------------------------------------------ No results for input(s): VITAMINB12, FOLATE, FERRITIN, TIBC, IRON, RETICCTPCT in the last 72 hours.  Coagulation profile  Recent Labs Lab 11/03/15 2335 11/04/15 0556 11/05/15 0714  INR 3.20* 2.96* 2.45*    No results for input(s): DDIMER in the last 72 hours.  Cardiac Enzymes No results for input(s): CKMB, TROPONINI, MYOGLOBIN in the last 168 hours.  Invalid input(s): CK ------------------------------------------------------------------------------------------------------------------ No results found for: BNP  Inpatient Medications  Scheduled Meds: . calcium acetate  667 mg Oral TID WC  . cinacalcet  30 mg Oral Q breakfast  . digoxin  0.125 mg Oral Once per day on Tue Fri  . doxercalciferol  10 mcg Intravenous Q T,Th,Sa-HD  . fenofibrate  160 mg Oral Daily  . metoprolol  100 mg Oral Daily  . piperacillin-tazobactam (ZOSYN)  IV  2.25 g Intravenous Q8H  . sevelamer carbonate  2,400 mg Oral TID AC  . vancomycin  1,000 mg Intravenous Q T,Th,Sa-HD  . warfarin  2.5 mg Oral ONCE-1800  . Warfarin - Pharmacist Dosing Inpatient   Does not apply q1800   Continuous Infusions:  PRN Meds:.  Micro Results Recent Results (from the past 240 hour(s))  MRSA PCR Screening     Status: None   Collection Time: 11/03/15 11:35 PM  Result Value Ref Range Status   MRSA by PCR NEGATIVE NEGATIVE Final    Comment:        The  GeneXpert MRSA Assay (FDA approved for NASAL specimens only), is one component of a comprehensive MRSA colonization surveillance program. It is not intended to diagnose MRSA infection nor to guide or monitor treatment for MRSA infections.   Culture, blood (Routine X 2) w Reflex to ID Panel     Status: None (Preliminary result)   Collection Time: 11/03/15 11:48 PM  Result Value Ref Range Status   Specimen Description BLOOD BLOOD LEFT ARM  Final   Special Requests BOTTLES DRAWN AEROBIC AND ANAEROBIC 5CC  Final   Culture PENDING  Incomplete   Report Status PENDING  Incomplete    Radiology Reports No results found.  Time Spent in minutes  30   SINGH,PRASHANT K M.D on 11/05/2015 at 11:24 AM  Between 7am to 7pm - Pager - [phone]  After 7pm go to [email or web address] - password [facility]  [facility] -  Office  [phone]

## 2015-11-05 NOTE — Progress Notes (Signed)
Subjective:  Tolerated HD yesterday/  Leg feels better/[name] Bender states she cannot currently  live alone at home too weak "need Rehab"  Objective Vital signs in last 24 hours: Filed Vitals:   11/04/15 1800 11/04/15 1816 11/04/15 2012 11/05/15 0509  BP: 136/88 126/86 106/64 94/48  Pulse: 81 95 85 56  Temp:  97.9 F (36.6 C) 98.2 F (36.8 C) 97.8 F (36.6 C)  TempSrc:  Oral Oral Oral  Resp: Height:      Weight:      SpO2:   99% 94%   Weight change: 1.005 kg (2 lb 3.4 oz)   Physical Exam: General: Alert Obese WF NAD  Heart: Irreg, irreg with VR 80s ,no rub or gallopor mur Lungs: CTA , nonlabored breathing Abdomen: Obese soft with Large Ventral hernia ,nontender, BS +. Extremities: RLE ACE wrap dressing / 2  + pedal edema  Dialysis Access: Pos bruit R Fem AVGG   OP Dialysis Orders: [facility] EDW 107 (has been at 106.7 past week) [facility] , 2.25ca Time 4 hr Heparin 11,000. Access R fem AVGG  Hectorol 10 mcg IV/HD  Other Op labs= HGB 13.1 10/31/15 Ca 10/ Phos 10.7 And pth 1036 10/24/15  Problem/Plan: 1. RLE Cellulitis = per admit team on Iv Vanco. 2. ESRD - HD today  to keep on Schedule  TTS ( had missed Numerous OP txs past 1.5 weeks sec to son"s hosp / death) needs vol removedwith  Will help cellulitis Dx Also   3. Hypertension/volume - bp lowish  / on metop as op change to hs (A. Fib)/ needs vol uf with hd And lower edw at dc  4. Anemiaof ESRD - no esa or fe as op Monitor HGB/ hgb 13.9 5. Metabolic bone disease - As op noncompliant with binder Renvela and sensipar / Vit d iv on HD tts/ Ca cor 9.8  6. Nutrition - Renal diet , vit . 7. Ho A. Fib - on coumadin / Digoxin / Metoprol Per admit Team rx  Lenny Pastel, PA-C [facility] Bee[facility] [phone] 11/05/2015,10:45 AM  LOS: 1 day   Labs: Basic Metabolic Panel:  Recent Labs Lab 11/03/15 1923 11/04/15 0556  NA 139 137  K 4.8 5.0  CL 98* 98*   CO2 24 21*  GLUCOSE 84 86  BUN 53* 58*  CREATININE 12.63* 12.94*  CALCIUM 9.6 9.2   Liver Function Tests:  Recent Labs Lab 11/03/15 1923  AST 16  ALT 12*  ALKPHOS 61  BILITOT 0.8  PROT 7.1  ALBUMIN 3.2*   No results for input(s): LIPASE, AMYLASE in the last 168 hours. No results for input(s): AMMONIA in the last 168 hours. CBC:  Recent Labs Lab 11/03/15 1923 11/04/15 0556  WBC 5.7 7.0  NEUTROABS 4.0  --   HGB 14.4 13.9  HCT 45.4 44.7  MCV 97.4 95.9  PLT 150 153   Cardiac Enzymes: No results for input(s): CKTOTAL, CKMB, CKMBINDEX, TROPONINI in the last 168 hours. CBG: No results for input(s): GLUCAP in the last 168 hours.  Studies/Results: No results found. Medications:   . calcium acetate  667 mg Oral TID WC  . cinacalcet  30 mg Oral Q breakfast  . digoxin  0.125 mg Oral Once per day on Tue Fri  . doxercalciferol  10 mcg Intravenous Q T,Th,Sa-HD  . fenofibrate  160 mg Oral Daily  . metoprolol  100 mg Oral Daily  . piperacillin-tazobactam (ZOSYN)  IV  2.25 g Intravenous Q8H  . sevelamer carbonate  2,400 mg Oral TID AC  . vancomycin  1,000 mg Intravenous Q T,Th,Sa-HD  . warfarin  2.5 mg Oral ONCE-1800  . Warfarin - Pharmacist Dosing Inpatient   Does not apply [phone]

## 2015-11-05 NOTE — Progress Notes (Signed)
Patient assisted up to chair using walker. While completing skin assessment, patient's sacrum/buttocks were noted to be red and blanchable. But there were also 3 circular areas noted on right buttock that were slightly darker in color and barely blanchable. Verified with CN and WOC consult placed for further verification. Prophylactic sacral dressing placed and protocol initiated.  Leanna BattlesEckelmann, Ashiya Kinkead Eileen, RN.

## 2015-11-05 NOTE — Progress Notes (Signed)
PT Cancellation Note  Patient Details Name: [name] Bender MRN: [id number] DOB: 10/16/1955   Cancelled Treatment:    Reason Eval/Treat Not Completed: Patient at procedure or test/unavailable, will continue to follow for evaluation.    Christiane HaBenjamin J. Clydie Dillen, PT, CSCS Pager [phone] Office [phone]  11/05/2015, 1:28 PM

## 2015-11-06 LAB — PROTIME-INR
INR: 2.91 — AB (ref 0.00–1.49)
Prothrombin Time: 29.9 seconds — ABNORMAL HIGH (ref 11.6–15.2)

## 2015-11-06 MED ORDER — WARFARIN SODIUM 2.5 MG PO TABS
2.5000 mg | ORAL_TABLET | Freq: Once | ORAL | Status: AC
  Administered 2015-11-06: 2.5 mg via ORAL
  Filled 2015-11-06: qty 1

## 2015-11-06 NOTE — Plan of Care (Signed)
Problem: Skin Integrity: Goal: Skin integrity will improve Outcome: Progressing Dressing changes being completed to RLE daily.  Problem: Activity: Goal: Ability to tolerate increased activity will improve Outcome: Completed/Met Date Met:  11/06/15 Patient has been sitting in the recliner for several hours a day.  Problem: Coping: Goal: Development of coping mechanisms to deal with changes in body function or appearance will improve Outcome: Progressing Patient is not new to HD, so no additional stress from that. But patient has been having numerous family stressors; chaplain services offered but patient declined at this time.  Problem: Discharge Planning: Goal: Ability to manage health-related needs will improve Outcome: Completed/Met Date Met:  11/06/15 Patient already setup with outpatient HD center prior to admission.  Problem: Pain Managment: Goal: General experience of comfort will improve Outcome: Progressing Prn pain medications being used to assist in controlling patient's pain.

## 2015-11-06 NOTE — Evaluation (Signed)
Physical Therapy Evaluation Patient Details Name: [name] Bender MRN: [id number] DOB: [birth year] Today's Date: 11/06/2015   History of Present Illness  [name] Bender is a 60 y.o. female with medical history significant of ESRD dialysis TTS, missed Saturday dialysis. Patient presents to the ED with c/o RLE erythema, pain, skin breakage. She was seen here on 4/29 for same complaint. Treated with clindamycin as outpatient. Unfortunatly this failed and drainage, erythema, etc has persisted.  Clinical Impression   Pt admitted with above diagnosis. Pt currently with functional limitations due to the deficits listed below (see PT Problem List).  Pt will benefit from skilled PT to increase their independence and safety with mobility to allow discharge to the venue listed below.       Follow Up Recommendations SNF    Equipment Recommendations  Rolling walker with 5" wheels;3in1 (PT)    Recommendations for Other Services       Precautions / Restrictions Precautions Precautions: Fall      Mobility  Bed Mobility                  Transfers Overall transfer level: Needs assistance Equipment used: Rolling walker (2 wheeled) Transfers: Sit to/from Stand Sit to Stand: Min assist         General transfer comment: Min assist to power up  Ambulation/Gait Ambulation/Gait assistance: Min assist Ambulation Distance (Feet): 20 Feet Assistive device: Rolling walker (2 wheeled) Gait Pattern/deviations: Decreased step length - right;Trunk flexed     General Gait Details: Cues for posture, RW proximity, and safety; cues also to self-monitor for activity tolerance  Stairs            Wheelchair Mobility    Modified Rankin (Stroke Patients Only)       Balance Overall balance assessment: Needs assistance           Standing balance-Leahy Scale: Poor Standing balance comment: Heavy reliance on UE suport                             Pertinent  Vitals/Pain Pain Assessment: Faces Faces Pain Scale: Hurts little more Pain Location: R kneecap; has been dealing withthis pain for a while Pain Descriptors / Indicators: Aching Pain Intervention(s): Limited activity within patient's tolerance;Monitored during session;Repositioned    Home Living Family/patient expects to be discharged to:: Skilled nursing facility Living Arrangements: Spouse/significant other                    Prior Function Level of Independence: Independent with assistive device(s)         Comments: Uses transport chair at times when tired; Often post HD     Hand Dominance        Extremity/Trunk Assessment   Upper Extremity Assessment: Overall WFL for tasks assessed           Lower Extremity Assessment: Generalized weakness;RLE deficits/detail RLE Deficits / Details: Lower LE in dressing; pain anterior knee with amb       Communication   Communication: No difficulties  Cognition Arousal/Alertness: Awake/alert Behavior During Therapy: WFL for tasks assessed/performed Overall Cognitive Status: Within Functional Limits for tasks assessed                      General Comments      Exercises        Assessment/Plan    PT Assessment Patient needs continued PT services  PT Diagnosis Difficulty  walking;Acute pain;Generalized weakness   PT Problem List Decreased strength;Decreased range of motion;Decreased activity tolerance;Decreased balance;Decreased mobility;Decreased knowledge of use of DME;Pain;Decreased knowledge of precautions  PT Treatment Interventions DME instruction;Gait training;Functional mobility training;Therapeutic activities;Therapeutic exercise;Patient/family education   PT Goals (Current goals can be found in the Care Plan section) Acute Rehab PT Goals Patient Stated Goal: Did not state, is agreeable to walking PT Goal Formulation: With patient Time For Goal Achievement: 11/20/15 Potential to Achieve Goals:  Good    Frequency Min 3X/week   Barriers to discharge        Co-evaluation               End of Session Equipment Utilized During Treatment: Gait belt Activity Tolerance: Patient tolerated treatment well Patient left: in chair;with call bell/phone within reach Nurse Communication: Mobility status         Time: 6962-9528 PT Time Calculation (min) (ACUTE ONLY): 17 min   Charges:   PT Evaluation $PT Eval Moderate Complexity: 1 Procedure     PT G CodesVan Clines Hamff 11/06/2015, 1:29 PM  Van Clines, [place]  Acute Rehabilitation Services Pager [phone] Office [phone]

## 2015-11-06 NOTE — Progress Notes (Signed)
Patient's HR has occasionally been increasing to 140's, but non sustained. Patient states she has been coughing a lot this morning. Dr. Randol KernElgergawy notified. Morning dose of metoprolol given early and Dr. Randol KernElgergawy aware.  Leanna BattlesEckelmann, Deanndra Kirley Eileen, RN.

## 2015-11-06 NOTE — Clinical Social Work Note (Signed)
Clinical Social Work Assessment  Patient Details  Name: [name] Bender MRN: [id number] Date of Birth: 05/21/1956  Date of referral:  11/06/15               Reason for consult:  Facility Placement                Permission sought to share information with:  Family Supports Permission granted to share information::  Yes, Verbal Permission Granted  Name::     [name] Bender  Agency::     Relationship::  Son  Contact Information:  [phone]  Housing/Transportation Living arrangements for the past 2 months:  Single Family Home Source of Information:  Patient Patient Interpreter Needed:  None Criminal Activity/Legal Involvement Pertinent to Current Situation/Hospitalization:  No - Comment as needed Significant Relationships:  Spouse, Adult Children ([name] Bender and [name] Bender.) Lives with:  Spouse Do you feel safe going back to the place where you live?  No (Patient verbalized that she is weak and cannot be at home alone and rehab needed.) Need for family participation in patient care:  Yes (Comment)  Care giving concerns: Patient expressed that she knows she is weak and needs rehab as would be home alone. Patient's husband is in hospital and son is married and has a young child.  Social Worker assessment / plan:  CSW talked with patient at the bedside regarding discharge planning and recommendation of ST rehab. After introductions, CSW checked in with patient on how she was doing as one of her sons recently died and her husband is also in the hospital. CSW allowed patient to verbalize her feelings and listened empathically and provided support. Patient in full agreement with SNF for ST rehab and after [place] [facility] [place] [facility]. Contact made with [name] BandyNikki (admissions director at [facility]) in patient's room and she can take patient if [name] Bender can arrange Saturday HD transportation.  Patient reported that family members son, sister or brother) will transport her to dialysis on Saturdays. Per [name] BandyNikki, they can take patient and provided her with a private room.  [name] Bender was noticeably pleased about going to a facility so close to her HD center. Patient also advised CSW that her family can transport her to SNF at discharge (nurse was in the room at the time of this discussion and indicated that patient would be fine riding in a vehicle to the facility).    Employment status:  Disabled (Comment on whether or not currently receiving Disability) Insurance information:  Medicare, Managed Care (BCBS Supplement) PT Recommendations:  Skilled Nursing Facility Information / Referral to community resources:  Skilled Nursing Facility (SNF list not needed as patient expressed facility preference)  Patient/Family's Response to care:  No concerns expressed regarding care during hospitalization.  Patient/Family's Understanding of and Emotional Response to Diagnosis, Current Treatment, and Prognosis:  Not discussed.  Emotional Assessment Appearance:  Appears older than stated age Attitude/Demeanor/Rapport:  Other (Appropriate) Affect (typically observed):  Appropriate, Grieving Orientation:  Oriented to Self, Oriented to Place, Oriented to  Time, Oriented to Situation Alcohol / Substance use:  Never Used Psych involvement (Current and /or in the community):  No (Comment)  Discharge Needs  Concerns to be addressed:  Discharge Planning Concerns Readmission within the last 30 days:  No Current discharge risk:  None Barriers to Discharge:  No Barriers Identified   [name] GoldmannCrawford, Kiyani Jernigan Bradley, LCSW 11/06/2015, 3:01 PM

## 2015-11-06 NOTE — NC FL2 (Signed)
[place] MEDICAID FL2 LEVEL OF CARE SCREENING TOOL     IDENTIFICATION  Patient Name: [name] Bender Birthdate: 08/21/1955 Sex: female Admission Date (Current Location): 11/03/2015  [facility] and [place] Number:  [employer] and Address:  [facility] [place], [address] [place], [place] [address]      Provider Number: [id number]  Attending Physician Name and Address:  Starleen Arms, MD  Relative Name and Phone Number:  Delford Field - son. Phone #[phone]    Current Level of Care: Hospital Recommended Level of Care: Skilled Nursing Facility Prior Approval Number:    Date Approved/Denied:   PASRR Number: [id number] (Eff. 11/06/15)  Discharge Plan: SNF    Current Diagnoses: Patient Active Problem List   Diagnosis Date Noted  . ESRD (end stage renal disease) (HCC) 11/04/2015  . Cellulitis of right lower leg 11/03/2015  . Encounter for therapeutic drug monitoring 10/11/2014  . ESRD 05/29/2009  . HYPERCHOLESTEROLEMIA 05/28/2009  . OBESITY 05/28/2009  . Essential hypertension 05/28/2009  . ATRIAL FIBRILLATION 05/28/2009  . POLYCYSTIC KIDNEY DISEASE 05/28/2009    Orientation RESPIRATION BLADDER Height & Weight     Self, Time, Situation, Place  Normal Continent Weight: 226 lb 3.1 oz (102.6 kg) Height:  5\' 3"  (160 cm)  BEHAVIORAL SYMPTOMS/MOOD NEUROLOGICAL BOWEL NUTRITION STATUS      Incontinent Diet (Renal with 1200 mL fluid restriction)  AMBULATORY STATUS COMMUNICATION OF NEEDS Skin   Limited Assist (Patient ambulated 20 ft. with rolling walker on 5/10) Verbally Other (Comment) (Cellulitis right lower leg. Ecchymosis bilateral arm. Intertiriginour dermatitis on bilateral abdomen. Weeping right lower leg.)                       Personal Care Assistance Level of Assistance  Bathing, Feeding, Dressing Bathing Assistance: Limited assistance Feeding assistance: Independent Dressing Assistance: Limited assistance     Functional  Limitations Info  Sight, Hearing, Speech Sight Info: Adequate Hearing Info: Adequate Speech Info: Adequate    SPECIAL CARE FACTORS FREQUENCY  PT (By licensed PT)     PT Frequency: Evaluation 11/06/15 - a minimum of 3X per week recommended              Contractures Contractures Info: Not present    Additional Factors Info  Code Status, Allergies Code Status Info: Full Code Allergies Info: Ancef           Current Medications (11/06/2015):  This is the current hospital active medication list Current Facility-Administered Medications  Medication Dose Route Frequency Provider Last Rate Last Dose  . cinacalcet (SENSIPAR) tablet 30 mg  30 mg Oral Q breakfast Hillary Bow, DO   30 mg at 11/06/15 2841  . digoxin (LANOXIN) tablet 0.125 mg  0.125 mg Oral Once per day on Tue Fri Hillary Bow, DO   0.125 mg at 11/05/15 1703  . fenofibrate tablet 160 mg  160 mg Oral Daily Hillary Bow, DO   160 mg at 11/06/15 1016  . hydrALAZINE (APRESOLINE) injection 10 mg  10 mg Intravenous Q6H PRN Leroy Sea, MD      . HYDROcodone-acetaminophen (NORCO/VICODIN) 5-325 MG per tablet 1 tablet  1 tablet Oral Q4H PRN Leroy Sea, MD   1 tablet at 11/06/15 0704  . metoprolol tartrate (LOPRESSOR) tablet 100 mg  100 mg Oral Daily Hillary Bow, DO   100 mg at 11/06/15 3244  . piperacillin-tazobactam (ZOSYN) IVPB 2.25 g  2.25 g Intravenous  Q8H Leroy Sea, MD   2.25 g at 11/06/15 0551  . sevelamer carbonate (RENVELA) tablet 2,400 mg  2,400 mg Oral TID AC Hillary Bow, DO   800 mg at 11/06/15 1326  . vancomycin (VANCOCIN) IVPB 1000 mg/200 mL premix  1,000 mg Intravenous Q T,Th,Sa-HD Leroy Sea, MD   1,000 mg at 11/05/15 1842  . warfarin (COUMADIN) tablet 2.5 mg  2.5 mg Oral ONCE-1800 Herby Abraham, RPH      . Warfarin - Pharmacist Dosing Inpatient   Does not apply [id number] Leroy Sea, MD         Discharge Medications: Please see discharge summary for a list of discharge  medications.  Relevant Imaging Results:  Relevant Lab Results:   Additional Information ss#[id number]. DIALYSIS PATIENT: TTS at [facility].  Cristobal Goldmann, LCSW

## 2015-11-06 NOTE — Progress Notes (Signed)
Subjective:  Tolerated HD yest  On schedule/ lower leg discomfort continues but better/ wants rehab for deconditioning,no one at home now if dc would be alone, Husband ="in [facility] had big  MI"   Objective Vital signs in last 24 hours: Filed Vitals:   11/05/15 1707 11/05/15 2005 11/06/15 0513 11/06/15 0842  BP: 94/69 109/58 117/78 125/107  Pulse: 79 79 74 116  Temp:  98.1 F (36.7 C)  97.8 F (36.6 C)  TempSrc:  Oral Oral Oral  Resp: Height:      Weight:      SpO2: 100% 94% 99% 98%   Weight change: -3.6 kg (-7 lb 15 oz) Physical Exam: General: Alert Obese WF NAD  Heart: Irreg, irreg with VR 70s ,no rub or gallop or mur Lungs: CTA , nonlabored breathing Abdomen: Obese soft with Large Ventral hernia ,nontender, BS +. Extremities: RLE ACE wrap dressing / 2 + pedal edema  Dialysis Access: Pos bruit R Fem AVGG  OP Dialysis Orders: [facility] . EDW 107 (has been at 106.7 past week) HD [facility] , 2.25ca Time 4 hr Heparin 11,000. Access R fem AVGG  Hectorol 10 mcg IV/HD  Other Op labs= HGB 13.1 10/31/15 Ca 10/ Phos 10.7 And pth 1036 10/24/15  Problem/Plan: 1. RLE Cellulitis = per admit team on Iv Vanco. 2. ESRD - HD yest to keep on Schedule TTS ( had missed Numerous OP txs past 1.5 weeks sec to son"s hosp / death) needs vol removedwith Will help cellulitis Dx Also  3. Hypertension/volume - bp lowish and asymptomatic/ now  stable  / on metop as op (A. Fib)/ wt pos hd to 102.6 lower than edw Tolerated uf  needs more uf tomor HD /lower edw at dc  4. Anemiaof ESRD - no esa or fe as op Monitor HGB/ hgb 12.8 5. Metabolic bone disease - As op noncompliant with binder Renvela and phoslo  and sensipar / Vit d iv on HD tts/ Ca cor 10.6 /  Hold  Vit d  And Phoslo with ^ Ca , phos   Yest. Down to 6.8 was 10.7 last op lab 6. Nutrition - Renal diet , vit . 7. Ho A. Fib - on coumadin / Digoxin / Metoprol Per admit Team rx     Lenny Pastel, PA-C [facility] [phone] 11/06/2015,9:15 AM  LOS: 2 days   Labs: Basic Metabolic Panel:  Recent Labs Lab 11/03/15 1923 11/04/15 0556 11/05/15 1315  NA 139 137 139  K 4.8 5.0 4.2  CL 98* 98* 97*  CO2 24 21* 27  GLUCOSE 84 86 86  BUN 53* 58* 25*  CREATININE 12.63* 12.94* 8.03*  CALCIUM 9.6 9.2 9.5  PHOS  --   --  6.8*   Liver Function Tests:  Recent Labs Lab 11/03/15 1923 11/05/15 1315  AST 16  --   ALT 12*  --   ALKPHOS 61  --   BILITOT 0.8  --   PROT 7.1  --   ALBUMIN 3.2* 2.6*   No results for input(s): LIPASE, AMYLASE in the last 168 hours. No results for input(s): AMMONIA in the last 168 hours. CBC:  Recent Labs Lab 11/03/15 1923 11/04/15 0556 11/05/15 1315  WBC 5.7 7.0 5.7  NEUTROABS 4.0  --   --   HGB 14.4 13.9 12.8  HCT 45.4 44.7 40.8  MCV 97.4 95.9 95.6  PLT 150 153 124*   Cardiac Enzymes: No  results for input(s): CKTOTAL, CKMB, CKMBINDEX, TROPONINI in the last 168 hours. CBG: No results for input(s): GLUCAP in the last 168 hours.  Studies/Results: No results found. Medications:   . calcium acetate  667 mg Oral TID WC  . cinacalcet  30 mg Oral Q breakfast  . digoxin  0.125 mg Oral Once per day on Tue Fri  . doxercalciferol  10 mcg Intravenous Q T,Th,Sa-HD  . fenofibrate  160 mg Oral Daily  . metoprolol  100 mg Oral Daily  . piperacillin-tazobactam (ZOSYN)  IV  2.25 g Intravenous Q8H  . sevelamer carbonate  2,400 mg Oral TID AC  . vancomycin  1,000 mg Intravenous Q T,Th,Sa-HD  . Warfarin - Pharmacist Dosing Inpatient   Does not apply [phone]

## 2015-11-06 NOTE — Progress Notes (Signed)
[name] CONSULT NOTE - Follow Up Consult  Pharmacy Consult for coumadin Indication: atrial fibrillation  Allergies  Allergen Reactions  . Ancef [Cefazolin Sodium] Itching    Patient Measurements: Height: 5\' 3"  (160 cm) Weight: 226 lb 3.1 oz (102.6 kg) IBW/kg (Calculated) : 52.4   Vital Signs: Temp: 97.8 Bender (36.6 C) (05/10 0842) Temp Source: Oral (05/10 0842) BP: 87/64 mmHg (05/10 1017) Pulse Rate: 79 (05/10 1017)  Labs:  Recent Labs  11/03/15 1923  11/04/15 0556 11/05/15 0714 11/05/15 1315 11/06/15 0515  HGB 14.4  --  13.9  --  12.8  --   HCT 45.4  --  44.7  --  40.8  --   PLT 150  --  153  --  124*  --   LABPROT  --   < > 30.3* 26.3*  --  29.9*  INR  --   < > 2.96* 2.45*  --  2.91*  CREATININE 12.63*  --  12.94*  --  8.03*  --   < > = values in this interval not displayed.  Estimated Creatinine Clearance: 8.4 mL/min (by C-G formula based on Cr of 8.03).   Medications:  Prescriptions prior to admission  Medication Sig Dispense Refill Last Dose  . calcium acetate (PHOSLO) 667 MG capsule Take 667 mg by mouth 3 (three) times daily with meals.     11/02/2015 at Unknown time  . cinacalcet (SENSIPAR) 30 MG tablet Take 30 mg by mouth daily.     11/02/2015 at Unknown time  . digoxin (LANOXIN) 0.125 MG tablet TAKE 1 TABLET (125 MCG TOTAL) BY MOUTH 2 (TWO) TIMES A WEEK. ON TUESDAY AND FRIDAY 48 tablet 0 11/01/2015 at Unknown time  . fenofibrate (TRICOR) 145 MG tablet Take 145 mg by mouth daily.     11/02/2015 at Unknown time  . metoprolol (LOPRESSOR) 100 MG tablet Take 1 tablet (100 mg total) by mouth daily. 90 tablet 0 11/02/2015 at 2200  . sevelamer (RENVELA) 800 MG tablet Take 2,400 mg by mouth 3 (three) times daily before meals.    11/02/2015 at Unknown time  . warfarin (COUMADIN) 5 MG tablet Take 1 tablet by mouth daily or as directed by coumadin clinic (Patient taking differently: Take 2.5-5 mg by mouth See admin instructions. Take 1 tablet on Wednesday then take 1/2  tablet all the other days) 30 tablet 0 11/02/2015 at 2200    Assessment: 60 yo Bender on coumadin PTA for afib.  INR therapeutic at 2.91 today.  No bleeding noted.  Goal of Therapy:  INR 2-3   Plan:  Coumadin 2.5 mg x 1 dose Daily INR  Herby AbrahamMichelle T. Lilyanna Lunt, Pharm.D. [phone] 11/06/2015 1:24 PM

## 2015-11-06 NOTE — Progress Notes (Addendum)
1652 - Patient returned from [facility].  1556 - Patient off the unit to visit her husband on [facility] with MD permission. CCMT aware.  Leanna BattlesEckelmann, Timber Lucarelli Eileen, RN.

## 2015-11-06 NOTE — Progress Notes (Signed)
PROGRESS NOTE                                                                                                                                                                                                             Patient Demographics:    [name] Bender, is a 60 y.o. female, DOB - 04-25-1956, OVF:[id number]  Admit date - 11/03/2015   Admitting Physician Hillary Bow, DO  Outpatient Primary MD for the patient is Dyke Maes, MD  LOS - 2  Outpatient Specialists: Dr. Marjory Sneddon nephrologist  Chief Complaint  Patient presents with  . Cellulitis       Brief Narrative  [name] Bender is a 60 y.o. female with medical history significant of ESRD dialysis TTS, missed 06-06-23 dialysis Due to recent death of her son and his funeral on that day. Patient presented to the ED with c/o RLE erythema, pain, skin breakage. She was seen here on 4/29 for same complaint. Treated with clindamycin as outpatient. Unfortunatly this failed and drainage, erythema, etc has persisted. She was admitted for right lower extremity cellulitis.    Subjective:    [name] Bender today has, No headache, No chest pain, No abdominal pain - No Nausea, No new weakness tingling or numbness, No Cough - SOB. Mild Right lower extremity pain.   Assessment  & Plan :     1.Right lower extremity cellulitis with evidence of fluid overload. Monitor cultures, continue with empiric IV antibiotics which are vancomycin and Zosyn, she has failed outpatient clindamycin treatment. Appears nontoxic and not septic. Dialysis for fluid removal.  2. ESRD. On Tuesday, Thursday and 2023/06/06 schedule. Missed last run due to son's funeral, renal consulted.  3. Chronic atrial fibrillation. [place] vasc 2 score of 2. Continue Lopressor, digoxin and Coumadin, pharmacy on board. INR therapeutic.  4. Essential hypertension. Continue beta blocker and added as needed  hydralazine.  5. Morbid obesity. Follow PCP.  6. Dyslipidemia. On TriCor continue.    Code Status : Full  Family Communication  : Sister at bedside.  Disposition Plan  : Home 3-4 days, pending PT evaluation.  Consults  : Renal  Procedures  :  None  DVT Prophylaxis  :  Coumadin  Lab Results  Component Value Date   INR 2.91* 11/06/2015   INR 2.45* 11/05/2015   INR 2.96* 11/04/2015  Lab Results  Component Value Date   PLT 124* 11/05/2015    Antibiotics  :     Anti-infectives    Start     Dose/Rate Route Frequency Ordered Stop   11/05/15 1800  vancomycin (VANCOCIN) IVPB 1000 mg/200 mL premix     1,000 mg 200 mL/hr over 60 Minutes Intravenous Every T-Th-Sa (Hemodialysis) 11/05/15 1021     11/04/15 1800  vancomycin (VANCOCIN) IVPB 1000 mg/200 mL premix  Status:  Discontinued     1,000 mg 200 mL/hr over 60 Minutes Intravenous Every Dialysis 11/04/15 1100 11/04/15 1106   11/04/15 1800  vancomycin (VANCOCIN) IVPB 1000 mg/200 mL premix     1,000 mg 200 mL/hr over 60 Minutes Intravenous Once in dialysis 11/04/15 1106 11/04/15 1805   11/04/15 1200  piperacillin-tazobactam (ZOSYN) IVPB 2.25 g     2.25 g 100 mL/hr over 30 Minutes Intravenous Every 8 hours 11/04/15 1059     11/03/15 2300  clindamycin (CLEOCIN) IVPB 600 mg  Status:  Discontinued     600 mg 100 mL/hr over 30 Minutes Intravenous Every 8 hours 11/03/15 2245 11/03/15 2247   11/03/15 2300  vancomycin (VANCOCIN) 2,000 mg in sodium chloride 0.9 % 500 mL IVPB     2,000 mg 250 mL/hr over 120 Minutes Intravenous  Once 11/03/15 2249 11/04/15 0139   11/03/15 2245  piperacillin-tazobactam (ZOSYN) IVPB 3.375 g     3.375 g 100 mL/hr over 30 Minutes Intravenous  Once 11/03/15 2238 11/03/15 2306   11/03/15 2145  vancomycin (VANCOCIN) IVPB 1000 mg/200 mL premix  Status:  Discontinued     1,000 mg 200 mL/hr over 60 Minutes Intravenous  Once 11/03/15 2139 11/03/15 2249   11/03/15 2145  piperacillin-tazobactam (ZOSYN) IVPB  3.375 g  Status:  Discontinued     3.375 g 12.5 mL/hr over 240 Minutes Intravenous  Once 11/03/15 2142 11/03/15 2238        Objective:   Filed Vitals:   11/05/15 2005 11/06/15 0513 11/06/15 0842 11/06/15 1017  BP: 109/58 117/78 125/107 87/64  Pulse: 79 74 116 79  Temp: 98.1 F (36.7 C)  97.8 F (36.6 C)   TempSrc: Oral Oral Oral   Resp: 19 20 18    Height:      Weight:      SpO2: 94% 99% 98%     Wt Readings from Last 3 Encounters:  11/05/15 102.6 kg (226 lb 3.1 oz)  10/26/15 106.5 kg (234 lb 12.6 oz)  04/04/14 108.773 kg (239 lb 12.8 oz)     Intake/Output Summary (Last 24 hours) at 11/06/15 1148 Last data filed at 11/06/15 1000  Gross per 24 hour  Intake    950 ml  Output   1400 ml  Net   -450 ml     Physical Exam  Awake Alert, Oriented X 3, No new F.N deficits, Normal affect [place].AT,PERRAL Supple Neck,No JVD, No cervical lymphadenopathy appriciated.  Symmetrical Chest wall movement, Good air movement bilaterally, CTAB RRR,No Gallops,Rubs or new Murmurs, No Parasternal Heave +ve B.Sounds, Abd Soft, No tenderness, No organomegaly appriciated, No rebound - guarding or rigidity. No Cyanosis, Clubbing or edema, No new Rash or bruise R leg swollen, red and warm, serous discharge, Bandaged    Data Review:    CBC  Recent Labs Lab 11/03/15 1923 11/04/15 0556 11/05/15 1315  WBC 5.7 7.0 5.7  HGB 14.4 13.9 12.8  HCT 45.4 44.7 40.8  PLT 150 153 124*  MCV 97.4 95.9 95.6  MCH 30.9 29.8  30.0  MCHC 31.7 31.1 31.4  RDW 15.1 15.3 15.4  LYMPHSABS 1.1  --   --   MONOABS 0.6  --   --   EOSABS 0.1  --   --   BASOSABS 0.0  --   --     Chemistries   Recent Labs Lab 11/03/15 1923 11/04/15 0556 11/05/15 1315  NA 139 137 139  K 4.8 5.0 4.2  CL 98* 98* 97*  CO2 24 21* 27  GLUCOSE 84 86 86  BUN 53* 58* 25*  CREATININE 12.63* 12.94* 8.03*  CALCIUM 9.6 9.2 9.5  AST 16  --   --   ALT 12*  --   --   ALKPHOS 61  --   --   BILITOT 0.8  --   --     ------------------------------------------------------------------------------------------------------------------ No results for input(s): CHOL, HDL, LDLCALC, TRIG, CHOLHDL, LDLDIRECT in the last 72 hours.  No results found for: HGBA1C ------------------------------------------------------------------------------------------------------------------ No results for input(s): TSH, T4TOTAL, T3FREE, THYROIDAB in the last 72 hours.  Invalid input(s): FREET3 ------------------------------------------------------------------------------------------------------------------ No results for input(s): VITAMINB12, FOLATE, FERRITIN, TIBC, IRON, RETICCTPCT in the last 72 hours.  Coagulation profile  Recent Labs Lab 11/03/15 2335 11/04/15 0556 11/05/15 0714 11/06/15 0515  INR 3.20* 2.96* 2.45* 2.91*    No results for input(s): DDIMER in the last 72 hours.  Cardiac Enzymes No results for input(s): CKMB, TROPONINI, MYOGLOBIN in the last 168 hours.  Invalid input(s): CK ------------------------------------------------------------------------------------------------------------------ No results found for: BNP  Inpatient Medications  Scheduled Meds: . cinacalcet  30 mg Oral Q breakfast  . digoxin  0.125 mg Oral Once per day on Tue Fri  . fenofibrate  160 mg Oral Daily  . metoprolol  100 mg Oral Daily  . piperacillin-tazobactam (ZOSYN)  IV  2.25 g Intravenous Q8H  . sevelamer carbonate  2,400 mg Oral TID AC  . vancomycin  1,000 mg Intravenous Q T,Th,Sa-HD  . Warfarin - Pharmacist Dosing Inpatient   Does not apply q1800   Continuous Infusions:  PRN Meds:.  Micro Results Recent Results (from the past 240 hour(s))  Culture, blood (Routine X 2) w Reflex to ID Panel     Status: None (Preliminary result)   Collection Time: 11/03/15 10:25 PM  Result Value Ref Range Status   Specimen Description BLOOD LEFT FOREARM  Final   Special Requests IN PEDIATRIC BOTTLE 3CC  Final   Culture NO GROWTH  1 DAY  Final   Report Status PENDING  Incomplete  MRSA PCR Screening     Status: None   Collection Time: 11/03/15 11:35 PM  Result Value Ref Range Status   MRSA by PCR NEGATIVE NEGATIVE Final    Comment:        The GeneXpert MRSA Assay (FDA approved for NASAL specimens only), is one component of a comprehensive MRSA colonization surveillance program. It is not intended to diagnose MRSA infection nor to guide or monitor treatment for MRSA infections.   Culture, blood (Routine X 2) w Reflex to ID Panel     Status: None (Preliminary result)   Collection Time: 11/03/15 11:48 PM  Result Value Ref Range Status   Specimen Description BLOOD BLOOD LEFT ARM  Final   Special Requests BOTTLES DRAWN AEROBIC AND ANAEROBIC 5CC  Final   Culture NO GROWTH 1 DAY  Final   Report Status PENDING  Incomplete    Radiology Reports No results found.    Randol Kern, Hai Grabe M.D on 11/06/2015 at 11:48 AM  Between 7am to  7pm - Pager - [phone]  After 7pm go to [email or web address] - password [facility]  [facility] -  Office  [phone]

## 2015-11-07 LAB — CBC
HEMATOCRIT: 40.7 % (ref 36.0–46.0)
HEMOGLOBIN: 12.9 g/dL (ref 12.0–15.0)
MCH: 30.9 pg (ref 26.0–34.0)
MCHC: 31.7 g/dL (ref 30.0–36.0)
MCV: 97.4 fL (ref 78.0–100.0)
Platelets: 152 10*3/uL (ref 150–400)
RBC: 4.18 MIL/uL (ref 3.87–5.11)
RDW: 15.5 % (ref 11.5–15.5)
WBC: 6.1 10*3/uL (ref 4.0–10.5)

## 2015-11-07 LAB — RENAL FUNCTION PANEL
ALBUMIN: 2.5 g/dL — AB (ref 3.5–5.0)
ANION GAP: 14 (ref 5–15)
BUN: 18 mg/dL (ref 6–20)
CHLORIDE: 97 mmol/L — AB (ref 101–111)
CO2: 27 mmol/L (ref 22–32)
Calcium: 9.1 mg/dL (ref 8.9–10.3)
Creatinine, Ser: 7.19 mg/dL — ABNORMAL HIGH (ref 0.44–1.00)
GFR calc Af Amer: 6 mL/min — ABNORMAL LOW (ref >60)
GFR, EST NON AFRICAN AMERICAN: 5 mL/min — AB (ref >60)
Glucose, Bld: 88 mg/dL (ref 65–99)
PHOSPHORUS: 5.8 mg/dL — AB (ref 2.5–4.6)
POTASSIUM: 3.7 mmol/L (ref 3.5–5.1)
SODIUM: 138 mmol/L (ref 135–145)

## 2015-11-07 LAB — PROTIME-INR
INR: 3.06 — AB (ref 0.00–1.49)
Prothrombin Time: 31 seconds — ABNORMAL HIGH (ref 11.6–15.2)

## 2015-11-07 MED ORDER — WARFARIN SODIUM 1 MG PO TABS
1.0000 mg | ORAL_TABLET | Freq: Once | ORAL | Status: AC
  Administered 2015-11-07: 1 mg via ORAL
  Filled 2015-11-07 (×2): qty 1

## 2015-11-07 MED ORDER — VANCOMYCIN HCL IN DEXTROSE 1-5 GM/200ML-% IV SOLN
INTRAVENOUS | Status: AC
  Administered 2015-11-07: 10:00:00
  Filled 2015-11-07: qty 200

## 2015-11-07 MED ORDER — ALPRAZOLAM 0.25 MG PO TABS
0.2500 mg | ORAL_TABLET | Freq: Two times a day (BID) | ORAL | Status: DC | PRN
  Administered 2015-11-07 (×2): 0.25 mg via ORAL
  Filled 2015-11-07 (×2): qty 1

## 2015-11-07 NOTE — Progress Notes (Signed)
PROGRESS NOTE                                                                                                                                                                                                             Patient Demographics:    [name] Bender, is a 60 y.o. female, DOB - 1955-12-21, ZOX:[id number]  Admit date - 11/03/2015   Admitting Physician Hillary Bow, DO  Outpatient Primary MD for the patient is Dyke Maes, MD  LOS - 3  Outpatient Specialists: Dr. Marjory Sneddon nephrologist  Chief Complaint  Patient presents with  . Cellulitis       Brief Narrative  [name] Bender is a 60 y.o. female with medical history significant of ESRD dialysis TTS, missed Nov 25, 2022 dialysis Due to recent death of her son and his funeral on that day. Patient presented to the ED with c/o RLE erythema, pain, skin breakage. She was seen here on 4/29 for same complaint. Treated with clindamycin as outpatient. Unfortunatly this failed and drainage, erythema, etc has persisted. She was admitted for right lower extremity cellulitis.    Subjective:    [name] Bender today has, No headache, No chest pain, No abdominal pain - No Nausea, No new weakness tingling or numbness, No Cough - SOB. Mild Right lower extremity pain.   Assessment  & Plan :     1.Right lower extremity cellulitis with evidence of fluid overload. Monitor cultures ,Remains with no growth, continue with empiric IV antibiotics which are vancomycin and Zosyn, she has failed outpatient clindamycin treatment. Appears nontoxic and not septic. Dialysis for fluid removal, continue to improve, hopefully can be discharged tomorrow on by mouth doxycycline.  2. ESRD. On Tuesday, Thursday and Nov 25, 2022 schedule. Missed last run due to son's funeral, renal consulted.  3. Chronic atrial fibrillation. [place] vasc 2 score of 2. Continue Lopressor, digoxin and Coumadin,  pharmacy on board. INR therapeutic.  4. Essential hypertension. Continue beta blocker and added as needed hydralazine.  5. Morbid obesity. Follow PCP.  6. Dyslipidemia. On TriCor continue.  7. Anxiety. The patient with  anxiety given recent sudden death, and husband is currently ill in step down, will start on Xanax when necessary  Code Status : Full  Family Communication  : noner at bedside.  Disposition Plan  : will need SNF placment, hopefully in a.m.  Consults  : Renal  Procedures  :  None  DVT Prophylaxis  :  Coumadin  Lab Results  Component Value Date   INR 3.06* 11/07/2015   INR 2.91* 11/06/2015   INR 2.45* 11/05/2015     Lab Results  Component Value Date   PLT 152 11/07/2015    Antibiotics  :     Anti-infectives    Start     Dose/Rate Route Frequency Ordered Stop   11/07/15 0912  vancomycin (VANCOCIN) 1-5 GM/200ML-% IVPB    Comments:  Verna Czech   : cabinet override      11/07/15 0912 11/07/15 0930   11/05/15 1800  vancomycin (VANCOCIN) IVPB 1000 mg/200 mL premix     1,000 mg 200 mL/hr over 60 Minutes Intravenous Every T-Th-Sa (Hemodialysis) 11/05/15 1021     11/04/15 1800  vancomycin (VANCOCIN) IVPB 1000 mg/200 mL premix  Status:  Discontinued     1,000 mg 200 mL/hr over 60 Minutes Intravenous Every Dialysis 11/04/15 1100 11/04/15 1106   11/04/15 1800  vancomycin (VANCOCIN) IVPB 1000 mg/200 mL premix     1,000 mg 200 mL/hr over 60 Minutes Intravenous Once in dialysis 11/04/15 1106 11/04/15 1805   11/04/15 1200  piperacillin-tazobactam (ZOSYN) IVPB 2.25 g     2.25 g 100 mL/hr over 30 Minutes Intravenous Every 8 hours 11/04/15 1059     11/03/15 2300  clindamycin (CLEOCIN) IVPB 600 mg  Status:  Discontinued     600 mg 100 mL/hr over 30 Minutes Intravenous Every 8 hours 11/03/15 2245 11/03/15 2247   11/03/15 2300  vancomycin (VANCOCIN) 2,000 mg in sodium chloride 0.9 % 500 mL IVPB     2,000 mg 250 mL/hr over 120 Minutes Intravenous  Once 11/03/15  2249 11/04/15 0139   11/03/15 2245  piperacillin-tazobactam (ZOSYN) IVPB 3.375 g     3.375 g 100 mL/hr over 30 Minutes Intravenous  Once 11/03/15 2238 11/03/15 2306   11/03/15 2145  vancomycin (VANCOCIN) IVPB 1000 mg/200 mL premix  Status:  Discontinued     1,000 mg 200 mL/hr over 60 Minutes Intravenous  Once 11/03/15 2139 11/03/15 2249   11/03/15 2145  piperacillin-tazobactam (ZOSYN) IVPB 3.375 g  Status:  Discontinued     3.375 g 12.5 mL/hr over 240 Minutes Intravenous  Once 11/03/15 2142 11/03/15 2238        Objective:   Filed Vitals:   11/07/15 0900 11/07/15 0930 11/07/15 0955 11/07/15 1011  BP: 94/25 99/43 76/26  99/56  Pulse: 66 66 78 74  Temp:    97.4 F (36.3 C)  TempSrc:    Oral  Resp:    16  Height:      Weight:    99.8 kg (220 lb 0.3 oz)  SpO2:    98%    Wt Readings from Last 3 Encounters:  11/07/15 99.8 kg (220 lb 0.3 oz)  10/26/15 106.5 kg (234 lb 12.6 oz)  04/04/14 108.773 kg (239 lb 12.8 oz)     Intake/Output Summary (Last 24 hours) at 11/07/15 1107 Last data filed at 11/07/15 1011  Gross per 24 hour  Intake    870 ml  Output   2053 ml  Net  -1183 ml     Physical Exam  Awake Alert, Oriented X 3, No new F.N deficits, Normal affect [place].AT,PERRAL Supple Neck,No JVD, No cervical lymphadenopathy appriciated.  Symmetrical Chest wall movement, Good air movement bilaterally, CTAB RRR,No Gallops,Rubs or new Murmurs, No Parasternal Heave +ve B.Sounds, Abd Soft, No tenderness, No  organomegaly appriciated, No rebound - guarding or rigidity. No Cyanosis, Clubbing or edema, No new Rash or bruise R leg swollen, Mild erythema , serous discharge, Bandaged    Data Review:    CBC  Recent Labs Lab 11/03/15 1923 11/04/15 0556 11/05/15 1315 11/07/15 0726  WBC 5.7 7.0 5.7 6.1  HGB 14.4 13.9 12.8 12.9  HCT 45.4 44.7 40.8 40.7  PLT 150 153 124* 152  MCV 97.4 95.9 95.6 97.4  MCH 30.9 29.8 30.0 30.9  MCHC 31.7 31.1 31.4 31.7  RDW 15.1 15.3 15.4 15.5    LYMPHSABS 1.1  --   --   --   MONOABS 0.6  --   --   --   EOSABS 0.1  --   --   --   BASOSABS 0.0  --   --   --     Chemistries   Recent Labs Lab 11/03/15 1923 11/04/15 0556 11/05/15 1315 11/07/15 0726  NA 139 137 139 138  K 4.8 5.0 4.2 3.7  CL 98* 98* 97* 97*  CO2 24 21* 27 27  GLUCOSE 84 86 86 88  BUN 53* 58* 25* 18  CREATININE 12.63* 12.94* 8.03* 7.19*  CALCIUM 9.6 9.2 9.5 9.1  AST 16  --   --   --   ALT 12*  --   --   --   ALKPHOS 61  --   --   --   BILITOT 0.8  --   --   --    ------------------------------------------------------------------------------------------------------------------ No results for input(s): CHOL, HDL, LDLCALC, TRIG, CHOLHDL, LDLDIRECT in the last 72 hours.  No results found for: HGBA1C ------------------------------------------------------------------------------------------------------------------ No results for input(s): TSH, T4TOTAL, T3FREE, THYROIDAB in the last 72 hours.  Invalid input(s): FREET3 ------------------------------------------------------------------------------------------------------------------ No results for input(s): VITAMINB12, FOLATE, FERRITIN, TIBC, IRON, RETICCTPCT in the last 72 hours.  Coagulation profile  Recent Labs Lab 11/03/15 2335 11/04/15 0556 11/05/15 0714 11/06/15 0515 11/07/15 0726  INR 3.20* 2.96* 2.45* 2.91* 3.06*    No results for input(s): DDIMER in the last 72 hours.  Cardiac Enzymes No results for input(s): CKMB, TROPONINI, MYOGLOBIN in the last 168 hours.  Invalid input(s): CK ------------------------------------------------------------------------------------------------------------------ No results found for: BNP  Inpatient Medications  Scheduled Meds: . cinacalcet  30 mg Oral Q breakfast  . digoxin  0.125 mg Oral Once per day on Tue Fri  . fenofibrate  160 mg Oral Daily  . metoprolol  100 mg Oral Daily  . piperacillin-tazobactam (ZOSYN)  IV  2.25 g Intravenous Q8H  .  sevelamer carbonate  2,400 mg Oral TID AC  . vancomycin  1,000 mg Intravenous Q T,Th,Sa-HD  . warfarin  1 mg Oral ONCE-1800  . Warfarin - Pharmacist Dosing Inpatient   Does not apply q1800   Continuous Infusions:  PRN Meds:.  Micro Results Recent Results (from the past 240 hour(s))  Culture, blood (Routine X 2) w Reflex to ID Panel     Status: None (Preliminary result)   Collection Time: 11/03/15 10:25 PM  Result Value Ref Range Status   Specimen Description BLOOD LEFT FOREARM  Final   Special Requests IN PEDIATRIC BOTTLE 3CC  Final   Culture NO GROWTH 2 DAYS  Final   Report Status PENDING  Incomplete  MRSA PCR Screening     Status: None   Collection Time: 11/03/15 11:35 PM  Result Value Ref Range Status   MRSA by PCR NEGATIVE NEGATIVE Final    Comment:  The GeneXpert MRSA Assay (FDA approved for NASAL specimens only), is one component of a comprehensive MRSA colonization surveillance program. It is not intended to diagnose MRSA infection nor to guide or monitor treatment for MRSA infections.   Culture, blood (Routine X 2) w Reflex to ID Panel     Status: None (Preliminary result)   Collection Time: 11/03/15 11:48 PM  Result Value Ref Range Status   Specimen Description BLOOD BLOOD LEFT ARM  Final   Special Requests BOTTLES DRAWN AEROBIC AND ANAEROBIC 5CC  Final   Culture NO GROWTH 2 DAYS  Final   Report Status PENDING  Incomplete    Radiology Reports No results found.    Randol KernELGERGAWY, DAWOOD M.D on 11/07/2015 at 11:07 AM  Between 7am to 7pm - Pager - [phone]  After 7pm go to [email or web address] - password [email or web address]  [facility] -  Office  [phone]

## 2015-11-07 NOTE — Procedures (Signed)
I have seen and examined this patient and agree with the plan of care   Hb 12.9  Alb 2.5  Ca 9.1  Phos 5.8  Tyrese Ficek W 11/07/2015, 8:31 AM

## 2015-11-07 NOTE — Progress Notes (Signed)
ANTICOAGULATION CONSULT NOTE - Follow Up Consult  Pharmacy Consult for coumadin Indication: atrial fibrillation  Allergies  Allergen Reactions  . Ancef [Cefazolin Sodium] Itching    Patient Measurements: Height: 5\' 3"  (160 cm) Weight: 220 lb 0.3 oz (99.8 kg) IBW/kg (Calculated) : 52.4   Vital Signs: Temp: 97.4 F (36.3 C) (05/11 1011) Temp Source: Oral (05/11 1011) BP: 99/56 mmHg (05/11 1011) Pulse Rate: 74 (05/11 1011)  Labs:  Recent Labs  11/05/15 0714 11/05/15 1315 11/06/15 0515 11/07/15 0726  HGB  --  12.8  --  12.9  HCT  --  40.8  --  40.7  PLT  --  124*  --  152  LABPROT 26.3*  --  29.9* 31.0*  INR 2.45*  --  2.91* 3.06*  CREATININE  --  8.03*  --  7.19*    Estimated Creatinine Clearance: 9.3 mL/min (by C-G formula based on Cr of 7.19).   Assessment: 60 yo F on coumadin PTA for afib.  INR therapeutic at 3.06 today.  No bleeding noted.  Goal of Therapy:  INR 2-3   Plan:  Coumadin 1 mg x 1 dose Daily INR PTA dose was 2.5 mg daily x 5 mg on Wednesdays  Herby AbrahamMichelle T. Ludmilla Mcgillis, Pharm.D. [phone] 11/07/2015 10:47 AM

## 2015-11-08 DIAGNOSIS — Q612 Polycystic kidney, adult type: Secondary | ICD-10-CM | POA: Diagnosis not present

## 2015-11-08 DIAGNOSIS — N2581 Secondary hyperparathyroidism of renal origin: Secondary | ICD-10-CM | POA: Diagnosis not present

## 2015-11-08 DIAGNOSIS — I482 Chronic atrial fibrillation: Secondary | ICD-10-CM | POA: Diagnosis not present

## 2015-11-08 DIAGNOSIS — I4891 Unspecified atrial fibrillation: Secondary | ICD-10-CM | POA: Diagnosis not present

## 2015-11-08 DIAGNOSIS — N186 End stage renal disease: Secondary | ICD-10-CM | POA: Diagnosis not present

## 2015-11-08 DIAGNOSIS — I10 Essential (primary) hypertension: Secondary | ICD-10-CM | POA: Diagnosis not present

## 2015-11-08 DIAGNOSIS — L03115 Cellulitis of right lower limb: Secondary | ICD-10-CM | POA: Diagnosis not present

## 2015-11-08 DIAGNOSIS — T82858D Stenosis of vascular prosthetic devices, implants and grafts, subsequent encounter: Secondary | ICD-10-CM | POA: Diagnosis not present

## 2015-11-08 DIAGNOSIS — L89152 Pressure ulcer of sacral region, stage 2: Secondary | ICD-10-CM | POA: Diagnosis not present

## 2015-11-08 DIAGNOSIS — E7800 Pure hypercholesterolemia, unspecified: Secondary | ICD-10-CM | POA: Diagnosis not present

## 2015-11-08 DIAGNOSIS — Z7901 Long term (current) use of anticoagulants: Secondary | ICD-10-CM | POA: Diagnosis not present

## 2015-11-08 DIAGNOSIS — D631 Anemia in chronic kidney disease: Secondary | ICD-10-CM | POA: Diagnosis not present

## 2015-11-08 DIAGNOSIS — M6281 Muscle weakness (generalized): Secondary | ICD-10-CM | POA: Diagnosis not present

## 2015-11-08 DIAGNOSIS — I871 Compression of vein: Secondary | ICD-10-CM | POA: Diagnosis not present

## 2015-11-08 DIAGNOSIS — Z992 Dependence on renal dialysis: Secondary | ICD-10-CM | POA: Diagnosis not present

## 2015-11-08 DIAGNOSIS — F4320 Adjustment disorder, unspecified: Secondary | ICD-10-CM | POA: Diagnosis not present

## 2015-11-08 DIAGNOSIS — I120 Hypertensive chronic kidney disease with stage 5 chronic kidney disease or end stage renal disease: Secondary | ICD-10-CM | POA: Diagnosis not present

## 2015-11-08 DIAGNOSIS — R2689 Other abnormalities of gait and mobility: Secondary | ICD-10-CM | POA: Diagnosis not present

## 2015-11-08 DIAGNOSIS — Z5181 Encounter for therapeutic drug level monitoring: Secondary | ICD-10-CM | POA: Diagnosis not present

## 2015-11-08 LAB — PROTIME-INR
INR: 3.15 — AB (ref 0.00–1.49)
PROTHROMBIN TIME: 31.8 s — AB (ref 11.6–15.2)

## 2015-11-08 MED ORDER — WARFARIN SODIUM 1 MG PO TABS
1.0000 mg | ORAL_TABLET | Freq: Every day | ORAL | Status: DC
  Filled 2015-11-08: qty 1

## 2015-11-08 MED ORDER — WARFARIN SODIUM 1 MG PO TABS: 1.0000 mg | ORAL_TABLET | Freq: Every day | ORAL | Status: DC

## 2015-11-08 MED ORDER — VANCOMYCIN HCL IN DEXTROSE 1-5 GM/200ML-% IV SOLN: 1000.0000 mg | INTRAVENOUS | Status: DC

## 2015-11-08 NOTE — Care Management Important Message (Signed)
Important Message  Patient Details  Name: [name] Bender MRN: [id number] Date of Birth: 10/02/1955   Medicare Important Message Given:  Yes    Elliot CousinShavis, Karrington Studnicka Ellen, RN 11/08/2015, 11:44 AM

## 2015-11-08 NOTE — Care Management Note (Signed)
Case Management Note  Patient Details  Name: Windy FastSheila A Daughdrill MRN: [id number] Date of Birth: 06/29/1955  Action/Plan: Discharge Planning:  1144 NCM spoke to pt at bedside. Son assist her with her care. CSW following for SNF placement. Scheduled dc today.   Expected Discharge Date:  11/08/15               Expected Discharge Plan:  Skilled Nursing Facility  In-House Referral:  Clinical Social Work  Discharge planning Services  CM Consult  Post Acute Care Choice:  NA Choice offered to:  NA  DME Arranged:  N/A DME Agency:  NA  HH Arranged:  NA HH Agency:  NA  Status of Service:  Completed, signed off  Medicare Important Message Given:  Yes Date Medicare IM Given:    Medicare IM give by:    Date Additional Medicare IM Given:    Additional Medicare Important Message give by:     If discussed at Long Length of Stay Meetings, dates discussed:    Additional Comments:  Elliot CousinShavis, Zyler Hyson Ellen, RN 11/08/2015, 4:13 PM

## 2015-11-08 NOTE — Progress Notes (Signed)
ANTICOAGULATION AND ANTIBIOTIC CONSULT NOTE  Pharmacy Consult for Coumadin, Zosyn, and Vancomycin Indication: atrial fibrillation and cellulitis  Allergies  Allergen Reactions  . Ancef [Cefazolin Sodium] Itching    Patient Measurements: Height: 5\' 3"  (160 cm) Weight: 216 lb 0.8 oz (98 kg) IBW/kg (Calculated) : 52.4  Vital Signs: Temp: 97.7 F (36.5 C) (05/12 0842) Temp Source: Axillary (05/12 0842) BP: 100/42 mmHg (05/12 0842) Pulse Rate: 99 (05/12 0842)  Labs:  Recent Labs  11/05/15 1315 11/06/15 0515 11/07/15 0726 11/08/15 0639  HGB 12.8  --  12.9  --   HCT 40.8  --  40.7  --   PLT 124*  --  152  --   LABPROT  --  29.9* 31.0* 31.8*  INR  --  2.91* 3.06* 3.15*  CREATININE 8.03*  --  7.19*  --     Estimated Creatinine Clearance: 9.2 mL/min (by C-G formula based on Cr of 7.19).   Assessment: 60 y.o. F presents with cellulitis.   AC: Home dose of coumadin 5mg  on Wed and 2.5mg  all other days.  CBC ok. INR 3.15 today.  No bleeding reported.   ID: Vanc/Zosyn day # 6 for RLE cellulitis with erythema/drainage; failed o/p treatment of Clinda. WBC wnl. AF  5/7 Vanc> 5/7 Zosyn >  5/7 BC x 2>>ngtd 5/7 MRSA PCR negative  Goal of Therapy:  INR 2-3   Plan:  Coumadin 1 mg po daily rec DC to SNF on 1 mg daily w/ f/u of INR on Monday  Vanc 1gm IV post-HD Tues, Thurs, Sat and  Zosyn 2.25 g IV q 8 hours Plan to DC to SNF on PO doxy.  Could do Vanc w/ HD if needed  Thank you for allowing us to participate in this patients care.  Herby AbrahamMichelle T. Ashaya Raftery, Pharm.D. [phone] 11/08/2015 10:36 AM

## 2015-11-08 NOTE — Progress Notes (Signed)
[facility]. AVS and discharge instructions sent to SNF by CSW. PIV removed per protocol. Tele box #9 removed and returned to nurse's station. Family to provide transportation to facility. Report called to [facility].  Leanna BattlesEckelmann, Shahram Alexopoulos Eileen, RN.

## 2015-11-08 NOTE — Progress Notes (Signed)
PT Cancellation Note  Patient Details Name: [name] FastSheila A Teasdale MRN: [id number] DOB: 03/26/1956   Cancelled Treatment:    Reason Eval/Treat Not Completed: Patient declined, no reason specified.  Will be going to SNF today.   Ivar DrapeStout, Sallie Maker E 11/08/2015, 3:00 PM  Samul Dadauth Zyliah Schier, PT MS Acute Rehab Dept. Number: [facility] [id number] and [facility] [phone]

## 2015-11-08 NOTE — Discharge Summary (Signed)
[name] Bender, is a 60 y.o. female  DOB 1955/07/10  MRN [id number].  Admission date:  11/03/2015  Admitting Physician  Hillary Bow, DO  Discharge Date:  11/08/2015   Primary MD  Dyke Maes, MD  Recommendations for primary care physician for things to follow:  - Please check INR next Monday and adjust warfarin dose as needed, INR 3.1 on discharge,, he will be discharged on 1 mg by mouth warfarin, dose was 5 mg oral daily except Wednesday 2.5. - Patient to continue with IV vancomycin after hemodialysis for 1 week then. Was communicated to nephrology prior to  Discharge.   Admission Diagnosis  Cellulitis of lower extremity, unspecified laterality [L03.119]   Discharge Diagnosis  Cellulitis of lower extremity, unspecified laterality [L03.119]   Principal Problem:   Cellulitis of right lower leg Active Problems:   ATRIAL FIBRILLATION   ESRD   ESRD (end stage renal disease) (HCC)      Past Medical History  Diagnosis Date  . Atrial fibrillation (HCC)   . Hypercholesterolemia   . HTN (hypertension)   . Polycystic kidney disease   . Supraclinoid carotid artery aneurysm, small (HCC)     Past Surgical History  Procedure Laterality Date  . Thrombectomy      of right upper arm ateriovenous graft with revision   . Rivght ovary removal      secondary to a cyst  . C-sections      x2  . Appendectomy    . Total knee arthroplasty  feb 2005    right knee  . Right oophorectomy         History of present illness and  Hospital Course:     Kindly see H&P for history of present illness and admission details, please review complete Labs, Consult reports and Test reports for all details in brief  HPI  from the history and physical done on the day of admission 11/02/3025 HPI: [name] Bender is a 60 y.o. female with medical history significant of ESRD dialysis TTS, missed June 13, 2023 dialysis.  Patient presents to the ED with c/o RLE erythema, pain, skin breakage. She was seen here on 4/29 for same complaint. Treated with clindamycin as outpatient. Unfortunatly this failed and drainage, erythema, etc has persisted.  Given IV zosyn and vanc dose in ED.   Hospital Course  Brief Narrative [name] Bender is a 60 y.o. female with medical history significant of ESRD dialysis TTS, missed 06-13-23 dialysis Due to recent death of her son and his funeral on that day. Patient presented to the ED with c/o RLE erythema, pain, skin breakage. She was seen here on 4/29 for same complaint. Treated with clindamycin as outpatient. Unfortunatly this failed and drainage, erythema, etc has persisted. She was admitted for right lower extremity cellulitis.  1.Right lower extremity cellulitis with evidence of fluid overload.  cultures Remains with no growth, treated with empiric IV antibiotics which are vancomycin and Zosyn during hospital stay given she has failed outpatient clindamycin treatment. Appears nontoxic and  not septic. Dialysis for fluid removal, continue to improve, will be discharged with another week off IV vancomycin after hemodialysis .  2. ESRD. On Tuesday, Thursday and Saturday schedule. Missed last run preoperative admission due to son's funeral, PhosLo has been stopped on discharge after discussion with renal.  3. Chronic atrial fibrillation. [place] vasc 2 score of 2. Continue Lopressor, digoxin and Coumadin. INR therapeutic, dose was lowered discharge giving supratherapeutic INR on discharge, check INR on Monday and adjust dose as needed.  4. Essential hypertension. Continue beta blocker   5. Morbid obesity. Follow PCP.  6. Dyslipidemia. On [facility] continue.     Discharge Condition: stable   Follow UP  Follow-up Information    Follow up with Dyke Maes, MD.   Specialty:  Nephrology   Contact information:   [address] [place] [address] [phone]          Discharge Instructions  and  Discharge Medications     Discharge Instructions    Discharge instructions    Complete by:  As directed   Follow with Primary MD MATTINGLY,MICHAEL T, MD  Monitor INR and adjust warfarin dose accordingly  Activity: As per PT/OT recommendation    Disposition SNF   Diet: Heart Healthy , renal modified diet with 1200 mL fluid restrictions , with feeding assistance and aspiration precautions.  For Heart failure patients - Check your Weight same time everyday, if you gain over 2 pounds, or you develop in leg swelling, experience more shortness of breath or chest pain, call your Primary MD immediately. Follow Cardiac Low Salt Diet and 1.5 lit/day fluid restriction.   On your next visit with your primary care physician please Get Medicines reviewed and adjusted.   Please request your Prim.MD to go over all Hospital Tests and Procedure/Radiological results at the follow up, please get all Hospital records sent to your Prim MD by signing hospital release before you go home.   If you experience worsening of your admission symptoms, develop shortness of breath, life threatening emergency, suicidal or homicidal thoughts you must seek medical attention immediately by calling 911 or calling your MD immediately  if symptoms less severe.  You Must read complete instructions/literature along with all the possible adverse reactions/side effects for all the Medicines you take and that have been prescribed to you. Take any new Medicines after you have completely understood and accpet all the possible adverse reactions/side effects.   Do not drive, operating heavy machinery, perform activities at heights, swimming or participation in water activities or provide baby sitting services if your were admitted for syncope or siezures until you have seen by Primary MD or a Neurologist and advised to do so again.  Do not drive when taking Pain medications.    Do not take more  than prescribed Pain, Sleep and Anxiety Medications  Special Instructions: If you have smoked or chewed Tobacco  in the last 2 yrs please stop smoking, stop any regular Alcohol  and or any Recreational drug use.  Wear Seat belts while driving.   Please note  You were cared for by a hospitalist during your hospital stay. If you have any questions about your discharge medications or the care you received while you were in the hospital after you are discharged, you can call the unit and asked to speak with the hospitalist on call if the hospitalist that took care of you is not available. Once you are discharged, your primary care physician will handle any further medical issues.  Please note that NO REFILLS for any discharge medications will be authorized once you are discharged, as it is imperative that you return to your primary care physician (or establish a relationship with a primary care physician if you do not have one) for your aftercare needs so that they can reassess your need for medications and monitor your lab values.     Discharge wound care:    Complete by:  As directed   Bedside nurse please change dressing to right leg Q day as follows: Apply xeroform gauze, then cover with ABD pad, and wrap with kerlex in a spiral fashion beginning behind toes to below knee, then cover with Ace wrap in the same manner. - Apply sacral pressure ulcers prophylactic foam every third day            Medication List    STOP taking these medications        calcium acetate 667 MG capsule  Commonly known as:  PHOSLO      TAKE these medications        cinacalcet 30 MG tablet  Commonly known as:  SENSIPAR  Take 30 mg by mouth daily.     digoxin 0.125 MG tablet  Commonly known as:  LANOXIN  TAKE 1 TABLET (125 MCG TOTAL) BY MOUTH 2 (TWO) TIMES A WEEK. ON TUESDAY AND FRIDAY     metoprolol 100 MG tablet  Commonly known as:  LOPRESSOR  Take 1 tablet (100 mg total) by mouth daily.     RENVELA 800  MG tablet  Generic drug:  sevelamer carbonate  Take 2,400 mg by mouth 3 (three) times daily before meals.     TRICOR 145 MG tablet  Generic drug:  fenofibrate  Take 145 mg by mouth daily.     vancomycin 1-5 GM/200ML-% Soln  Commonly known as:  VANCOCIN  Inject 200 mLs (1,000 mg total) into the vein Every Tuesday,Thursday,and Saturday with dialysis. Discontinue after hemodialysis on Tuesday, Thursday and Saturday for 1 week and then stop.     warfarin 1 MG tablet  Commonly known as:  COUMADIN  Take 1 tablet (1 mg total) by mouth daily at 6 PM. Milligram at bedtime for next 3 days, check INR level on Monday, and adjust dose as needed, patient home dose was 5 mg oral daily except Wednesday was 2.5.          Diet and Activity recommendation: See Discharge Instructions above   Consults obtained -  renal   Major procedures and Radiology Reports - PLEASE review detailed and final reports for all details, in brief -     No results found.  Micro Results    Recent Results (from the past 240 hour(s))  Culture, blood (Routine X 2) w Reflex to ID Panel     Status: None (Preliminary result)   Collection Time: 11/03/15 10:25 PM  Result Value Ref Range Status   Specimen Description BLOOD LEFT FOREARM  Final   Special Requests IN PEDIATRIC BOTTLE 3CC  Final   Culture NO GROWTH 3 DAYS  Final   Report Status PENDING  Incomplete  MRSA PCR Screening     Status: None   Collection Time: 11/03/15 11:35 PM  Result Value Ref Range Status   MRSA by PCR NEGATIVE NEGATIVE Final    Comment:        The GeneXpert MRSA Assay (FDA approved for NASAL specimens only), is one component of a comprehensive MRSA colonization surveillance program. It is not intended  to diagnose MRSA infection nor to guide or monitor treatment for MRSA infections.   Culture, blood (Routine X 2) w Reflex to ID Panel     Status: None (Preliminary result)   Collection Time: 11/03/15 11:48 PM  Result Value Ref Range  Status   Specimen Description BLOOD BLOOD LEFT ARM  Final   Special Requests BOTTLES DRAWN AEROBIC AND ANAEROBIC 5CC  Final   Culture NO GROWTH 3 DAYS  Final   Report Status PENDING  Incomplete       Today   Subjective:   [name] Bender today has no headache,no chest abdominal pain,no new weakness tingling or numbness, feels much better wants to go home today.   Objective:   Blood pressure 100/42, pulse 99, temperature 97.7 F (36.5 C), temperature source Axillary, resp. rate 18, height 5\' 3"  (1.6 m), weight 98 kg (216 lb 0.8 oz), SpO2 97 %.   Intake/Output Summary (Last 24 hours) at 11/08/15 1105 Last data filed at 11/08/15 1011  Gross per 24 hour  Intake   1590 ml  Output      1 ml  Net   1589 ml    Exam Awake Alert, Oriented X 3, No new F.N deficits, Normal affect [place].AT,PERRAL Supple Neck,No JVD, No cervical lymphadenopathy appriciated.  Symmetrical Chest wall movement, Good air movement bilaterally, CTAB RRR,No Gallops,Rubs or new Murmurs, No Parasternal Heave +ve B.Sounds, Abd Soft, No tenderness, No organomegaly appriciated, No rebound - guarding or rigidity. No Cyanosis, Clubbing or edema, No new Rash or bruise R leg wirth Mild erythema , Bandaged   Data Review   CBC w Diff: Lab Results  Component Value Date   WBC 6.1 11/07/2015   HGB 12.9 11/07/2015   HCT 40.7 11/07/2015   PLT 152 11/07/2015   LYMPHOPCT 18 11/03/2015   MONOPCT 10 11/03/2015   EOSPCT 2 11/03/2015   BASOPCT 0 11/03/2015    CMP: Lab Results  Component Value Date   NA 138 11/07/2015   K 3.7 11/07/2015   CL 97* 11/07/2015   CO2 27 11/07/2015   BUN 18 11/07/2015   CREATININE 7.19* 11/07/2015   PROT 7.1 11/03/2015   ALBUMIN 2.5* 11/07/2015   BILITOT 0.8 11/03/2015   ALKPHOS 61 11/03/2015   AST 16 11/03/2015   ALT 12* 11/03/2015  .   Total Time in preparing paper work, data evaluation and todays exam - 35 minutes  [name] Bender M.D on 11/08/2015 at 11:05 AM  [facility]   Office  [phone]

## 2015-11-08 NOTE — Clinical Social Work Note (Signed)
CSW spoke with patient regarding discharge plans.  Patient will have family transport to SNF.  RN updated.  SNF updated and dc summary was sent for SNF review.    [facility] Transportation: family RN to call report to: [phone]  Vickii PennaGina Trystyn Dolley, LCSW [phone]  2H 1-14; [facility] Line Licensed Clinical Social Worker

## 2015-11-08 NOTE — Plan of Care (Signed)
Problem: Skin Integrity: Goal: Skin integrity will improve Outcome: Adequate for Discharge Cellulitis greatly improved since admission.  Problem: Health Behavior/Discharge Planning: Goal: Ability to manage health-related needs will improve Outcome: Completed/Met Date Met:  11/08/15 Patient will be D/C'd to SNF.

## 2015-11-08 NOTE — Care Management Important Message (Signed)
Important Message  Patient Details  Name: Windy FastSheila A Shedlock MRN: [id number] Date of Birth: 03/18/1956   Medicare Important Message Given:  Yes    Majorie Santee P Algernon Mundie 11/08/2015, 1:29 PM

## 2015-11-08 NOTE — Discharge Instructions (Signed)
Information on my medicine - Coumadin®   (Warfarin) ° °Why was Coumadin prescribed for you? °Coumadin was prescribed for you because you have a blood clot or a medical condition that can cause an increased risk of forming blood clots. Blood clots can cause serious health problems by blocking the flow of blood to the heart, lung, or brain. Coumadin can prevent harmful blood clots from forming. °As a reminder your indication for Coumadin is:   Stroke Prevention Because Of Atrial Fibrillation ° °What test will check on my response to Coumadin? °While on Coumadin (warfarin) you will need to have an INR test regularly to ensure that your dose is keeping you in the desired range. The INR (international normalized ratio) number is calculated from the result of the laboratory test called prothrombin time (PT). ° °If an INR APPOINTMENT HAS NOT ALREADY BEEN MADE FOR YOU please schedule an appointment to have this lab work done by your health care provider within 7 days. °Your INR goal is usually a number between:  2 to 3 or your provider may give you a more narrow range like 2-2.5.  Ask your health care provider during an office visit what your goal INR is. ° °What  do you need to  know  About  COUMADIN? °Take Coumadin (warfarin) exactly as prescribed by your healthcare provider about the same time each day.  DO NOT stop taking without talking to the doctor who prescribed the medication.  Stopping without other blood clot prevention medication to take the place of Coumadin may increase your risk of developing a new clot or stroke.  Get refills before you run out. ° °What do you do if you miss a dose? °If you miss a dose, take it as soon as you remember on the same day then continue your regularly scheduled regimen the next day.  Do not take two doses of Coumadin at the same time. ° °Important Safety Information °A possible side effect of Coumadin (Warfarin) is an increased risk of bleeding. You should call your healthcare  provider right away if you experience any of the following: °? Bleeding from an injury or your nose that does not stop. °? Unusual colored urine (red or dark brown) or unusual colored stools (red or black). °? Unusual bruising for unknown reasons. °? A serious fall or if you hit your head (even if there is no bleeding). ° °Some foods or medicines interact with Coumadin® (warfarin) and might alter your response to warfarin. To help avoid this: °? Eat a balanced diet, maintaining a consistent amount of Vitamin K. °? Notify your provider about major diet changes you plan to make. °? Avoid alcohol or limit your intake to 1 drink for women and 2 drinks for men per day. °(1 drink is 5 oz. wine, 12 oz. beer, or 1.5 oz. liquor.) ° °Make sure that ANY health care provider who prescribes medication for you knows that you are taking Coumadin (warfarin).  Also make sure the healthcare provider who is monitoring your Coumadin knows when you have started a new medication including herbals and non-prescription products. ° °Coumadin® (Warfarin)  Major Drug Interactions  °Increased Warfarin Effect Decreased Warfarin Effect  °Alcohol (large quantities) °Antibiotics (esp. Septra/Bactrim, Flagyl, Cipro) °Amiodarone (Cordarone) °Aspirin (ASA) °Cimetidine (Tagamet) °Megestrol (Megace) °NSAIDs (ibuprofen, naproxen, etc.) °Piroxicam (Feldene) °Propafenone (Rythmol SR) °Propranolol (Inderal) °Isoniazid (INH) °Posaconazole (Noxafil) Barbiturates (Phenobarbital) °Carbamazepine (Tegretol) °Chlordiazepoxide (Librium) °Cholestyramine (Questran) °Griseofulvin °Oral Contraceptives °Rifampin °Sucralfate (Carafate) °Vitamin K  ° °Coumadin® (Warfarin) Major Herbal   Interactions  °Increased Warfarin Effect Decreased Warfarin Effect  °Garlic °Ginseng °Ginkgo biloba Coenzyme Q10 °Green tea °St. John’s wort   ° °Coumadin® (Warfarin) FOOD Interactions  °Eat a consistent number of servings per week of foods HIGH in Vitamin K °(1 serving = ½ cup)  °Collards  (cooked, or boiled & drained) °Kale (cooked, or boiled & drained) °Mustard greens (cooked, or boiled & drained) °Parsley *serving size only = ¼ cup °Spinach (cooked, or boiled & drained) °Swiss chard (cooked, or boiled & drained) °Turnip greens (cooked, or boiled & drained)  °Eat a consistent number of servings per week of foods MEDIUM-HIGH in Vitamin K °(1 serving = 1 cup)  °Asparagus (cooked, or boiled & drained) °Broccoli (cooked, boiled & drained, or raw & chopped) °Brussel sprouts (cooked, or boiled & drained) *serving size only = ½ cup °Lettuce, raw (green leaf, endive, romaine) °Spinach, raw °Turnip greens, raw & chopped  ° °These websites have more information on Coumadin (warfarin):  www.coumadin.com; °www.ahrq.gov/consumer/coumadin.htm; ° °Information on my medicine - Coumadin®   (Warfarin) ° °This medication education was reviewed with me or my healthcare representative as part of my discharge preparation.  The pharmacist that spoke with me during my hospital stay was:  Shelly Spenser T, RPH ° °Why was Coumadin prescribed for you? °Coumadin was prescribed for you because you have a blood clot or a medical condition that can cause an increased risk of forming blood clots. Blood clots can cause serious health problems by blocking the flow of blood to the heart, lung, or brain. Coumadin can prevent harmful blood clots from forming. °As a reminder your indication for Coumadin is:   Select from menu ° °What test will check on my response to Coumadin? °While on Coumadin (warfarin) you will need to have an INR test regularly to ensure that your dose is keeping you in the desired range. The INR (international normalized ratio) number is calculated from the result of the laboratory test called prothrombin time (PT). ° °If an INR APPOINTMENT HAS NOT ALREADY BEEN MADE FOR YOU please schedule an appointment to have this lab work done by your health care provider within 7 days. °Your INR goal is usually a number  between:  2 to 3 or your provider may give you a more narrow range like 2-2.5.  Ask your health care provider during an office visit what your goal INR is. ° °What  do you need to  know  About  COUMADIN? °Take Coumadin (warfarin) exactly as prescribed by your healthcare provider about the same time each day.  DO NOT stop taking without talking to the doctor who prescribed the medication.  Stopping without other blood clot prevention medication to take the place of Coumadin may increase your risk of developing a new clot or stroke.  Get refills before you run out. ° °What do you do if you miss a dose? °If you miss a dose, take it as soon as you remember on the same day then continue your regularly scheduled regimen the next day.  Do not take two doses of Coumadin at the same time. ° °Important Safety Information °A possible side effect of Coumadin (Warfarin) is an increased risk of bleeding. You should call your healthcare provider right away if you experience any of the following: °? Bleeding from an injury or your nose that does not stop. °? Unusual colored urine (red or dark brown) or unusual colored stools (red or black). °? Unusual bruising for unknown reasons. °? A   serious fall or if you hit your head (even if there is no bleeding). ° °Some foods or medicines interact with Coumadin® (warfarin) and might alter your response to warfarin. To help avoid this: °? Eat a balanced diet, maintaining a consistent amount of Vitamin K. °? Notify your provider about major diet changes you plan to make. °? Avoid alcohol or limit your intake to 1 drink for women and 2 drinks for men per day. °(1 drink is 5 oz. wine, 12 oz. beer, or 1.5 oz. liquor.) ° °Make sure that ANY health care provider who prescribes medication for you knows that you are taking Coumadin (warfarin).  Also make sure the healthcare provider who is monitoring your Coumadin knows when you have started a new medication including herbals and non-prescription  products. ° °Coumadin® (Warfarin)  Major Drug Interactions  °Increased Warfarin Effect Decreased Warfarin Effect  °Alcohol (large quantities) °Antibiotics (esp. Septra/Bactrim, Flagyl, Cipro) °Amiodarone (Cordarone) °Aspirin (ASA) °Cimetidine (Tagamet) °Megestrol (Megace) °NSAIDs (ibuprofen, naproxen, etc.) °Piroxicam (Feldene) °Propafenone (Rythmol SR) °Propranolol (Inderal) °Isoniazid (INH) °Posaconazole (Noxafil) Barbiturates (Phenobarbital) °Carbamazepine (Tegretol) °Chlordiazepoxide (Librium) °Cholestyramine (Questran) °Griseofulvin °Oral Contraceptives °Rifampin °Sucralfate (Carafate) °Vitamin K  ° °Coumadin® (Warfarin) Major Herbal Interactions  °Increased Warfarin Effect Decreased Warfarin Effect  °Garlic °Ginseng °Ginkgo biloba Coenzyme Q10 °Green tea °St. John’s wort   ° °Coumadin® (Warfarin) FOOD Interactions  °Eat a consistent number of servings per week of foods HIGH in Vitamin K °(1 serving = ½ cup)  °Collards (cooked, or boiled & drained) °Kale (cooked, or boiled & drained) °Mustard greens (cooked, or boiled & drained) °Parsley *serving size only = ¼ cup °Spinach (cooked, or boiled & drained) °Swiss chard (cooked, or boiled & drained) °Turnip greens (cooked, or boiled & drained)  °Eat a consistent number of servings per week of foods MEDIUM-HIGH in Vitamin K °(1 serving = 1 cup)  °Asparagus (cooked, or boiled & drained) °Broccoli (cooked, boiled & drained, or raw & chopped) °Brussel sprouts (cooked, or boiled & drained) *serving size only = ½ cup °Lettuce, raw (green leaf, endive, romaine) °Spinach, raw °Turnip greens, raw & chopped  ° °These websites have more information on Coumadin (warfarin):  www.coumadin.com; °www.ahrq.gov/consumer/coumadin.htm; ° ° °

## 2015-11-08 NOTE — Progress Notes (Signed)
Subjective:  No [place] hd yest/ for dc to nh /rehab  Objective Vital signs in last 24 hours: Filed Vitals:   11/07/15 1230 11/07/15 1857 11/07/15 2153 11/08/15 0522  BP: 98/71 100/56 135/97 107/58  Pulse:  78 68 101  Temp:  98.2 F (36.8 C) 98.5 F (36.9 C) 97.7 F (36.5 C)  TempSrc:  Oral Oral   Resp:  18 18 20   Height:      Weight:   98 kg (216 lb 0.8 oz)   SpO2:  96% 97% 97%   Weight change: -2.8 kg (-6 lb 2.8 oz)  Physical Exam: General: Alert Obese WF NAD  Heart: Irreg, irreg with VR 80s ,no rub or gallop or mur Lungs: CTA , nonlabored breathing Abdomen: Obese soft with Large Ventral hernia ,nontender, BS +. Extremities: RLE ACE wrap dressing / trace  + pedal edema  Dialysis Access: Pos bruit R Fem AVGG  OP Dialysis Orders: [facility] . EDW 107 (has been at 106.7 past week) HD Bath @k , 2.25ca Time 4 hr Heparin 11,000. Access R fem AVGG  Hectorol 10 mcg IV/HD  Other Op labs= HGB 13.1 10/31/15 Ca 10/ Phos 10.7 And pth 1036 10/24/15  Problem/Plan: 1. RLE Cellulitis = per admit team on Iv antib  Can gice at hd Vanco 2. ESRD - HD yest to keep on Schedule TTS ( had missed Numerous OP txs past 1.5 weeks sec to son"s hosp / death)  Lower edw with HD on sch.  3. Hypertension/volume - bp  stable / on metop as op (A. Fib)/ wt  to 99.5 kg lower tedw   4. Anemiaof ESRD - no esa or fe as op Monitor HGB/ hgb 12.9 5. Metabolic bone disease - As op noncompliant with binder Renvela and phoslo and sensipar / Vit d iv on HD tts/ Ca cor 10.4 / Hold Vit d And Phoslo with ^ Ca , phos Yest. Down to 5.8 was 10.7 last op lab 6. Nutrition - Renal diet , vit . 7. Ho A. Fib - on coumadin / Digoxin / Metoprol Per admit Team rx   Lenny Pastelavid Jackelynn Hosie, PA-C [facility] [phone] 11/08/2015,8:20 AM  LOS: 4 days   Labs: Basic Metabolic Panel:  Recent Labs Lab 11/04/15 0556 11/05/15 1315 11/07/15 0726  NA 137 139 138   K 5.0 4.2 3.7  CL 98* 97* 97*  CO2 21* 27 27  GLUCOSE 86 86 88  BUN 58* 25* 18  CREATININE 12.94* 8.03* 7.19*  CALCIUM 9.2 9.5 9.1  PHOS  --  6.8* 5.8*   Liver Function Tests:  Recent Labs Lab 11/03/15 1923 11/05/15 1315 11/07/15 0726  AST 16  --   --   ALT 12*  --   --   ALKPHOS 61  --   --   BILITOT 0.8  --   --   PROT 7.1  --   --   ALBUMIN 3.2* 2.6* 2.5*   No results for input(s): LIPASE, AMYLASE in the last 168 hours. No results for input(s): AMMONIA in the last 168 hours. CBC:  Recent Labs Lab 11/03/15 1923 11/04/15 0556 11/05/15 1315 11/07/15 0726  WBC 5.7 7.0 5.7 6.1  NEUTROABS 4.0  --   --   --   HGB 14.4 13.9 12.8 12.9  HCT 45.4 44.7 40.8 40.7  MCV 97.4 95.9 95.6 97.4  PLT 150 153 124* 152   Cardiac Enzymes: No results for input(s): CKTOTAL, CKMB, CKMBINDEX, TROPONINI  in the last 168 hours. CBG: No results for input(s): GLUCAP in the last 168 hours.  Studies/Results: No results found. Medications:   . cinacalcet  30 mg Oral Q breakfast  . digoxin  0.125 mg Oral Once per day on Tue Fri  . fenofibrate  160 mg Oral Daily  . metoprolol  100 mg Oral Daily  . piperacillin-tazobactam (ZOSYN)  IV  2.25 g Intravenous Q8H  . sevelamer carbonate  2,400 mg Oral TID AC  . vancomycin  1,000 mg Intravenous Q T,Th,Sa-HD  . Warfarin - Pharmacist Dosing Inpatient   Does not apply [phone]

## 2015-11-09 DIAGNOSIS — N2581 Secondary hyperparathyroidism of renal origin: Secondary | ICD-10-CM | POA: Diagnosis not present

## 2015-11-09 DIAGNOSIS — L03115 Cellulitis of right lower limb: Secondary | ICD-10-CM | POA: Diagnosis not present

## 2015-11-09 DIAGNOSIS — N186 End stage renal disease: Secondary | ICD-10-CM | POA: Diagnosis not present

## 2015-11-09 LAB — CULTURE, BLOOD (ROUTINE X 2)
CULTURE: NO GROWTH
CULTURE: NO GROWTH

## 2015-11-12 ENCOUNTER — Non-Acute Institutional Stay (SKILLED_NURSING_FACILITY): Payer: Medicare Other | Admitting: Internal Medicine

## 2015-11-12 ENCOUNTER — Encounter: Payer: Self-pay | Admitting: Internal Medicine

## 2015-11-12 DIAGNOSIS — I482 Chronic atrial fibrillation, unspecified: Secondary | ICD-10-CM

## 2015-11-12 DIAGNOSIS — N2581 Secondary hyperparathyroidism of renal origin: Secondary | ICD-10-CM | POA: Diagnosis not present

## 2015-11-12 DIAGNOSIS — L03115 Cellulitis of right lower limb: Secondary | ICD-10-CM

## 2015-11-12 DIAGNOSIS — I10 Essential (primary) hypertension: Secondary | ICD-10-CM

## 2015-11-12 DIAGNOSIS — N186 End stage renal disease: Secondary | ICD-10-CM | POA: Diagnosis not present

## 2015-11-12 DIAGNOSIS — E7800 Pure hypercholesterolemia, unspecified: Secondary | ICD-10-CM

## 2015-11-12 NOTE — Progress Notes (Signed)
MRN: [id number] Name: [name] Bender  Sex: female Age: 60 y.o. DOB: [birth year]  PSC #:  [facility] Facility/Room: 106 P Level Of Care: SNF Provider: Margit Hanks Emergency Contacts: Extended Emergency Contact Information Primary Emergency Contact: Keane Police [place] Home Phone: [phone] Relation: Son Secondary Emergency Contact: Cranston Neighbor Address: [address]          [place], [place] [address] [place] of [place] Home Phone: [phone] Relation: Spouse  Code Status: Full Code  Allergies: Ancef and Ancef  Chief Complaint  Patient presents with  . New Admit To SNF    HPI: Patient is 60 y.o. female with ESRD dialysis TTS, missed Nov 01, 2022 dialysis Due to recent death of her son and his funeral on that day. Patient presented to the ED with c/o RLE erythema, pain, skin breakage. She was seen here on 4/29 for same complaint. Treated with clindamycin as outpatient. Unfortunatly this failed and drainage, erythema, etc has persisted. She was admitted to [facility] from 5/7[facility]12 for right lower extremity cellulitis. Pt had a good response to IV vancomycin. Pt is admitted to SNF for generalized weakness and 1 more week of IV vancomycin. While at SNF pt will be followed for HTN, tx with metoprolol, hypercholesterolemia, tx with Tricor and AF, tx with metoprolol, digoxin and coumadin.  Past Medical History  Diagnosis Date  . Atrial fibrillation (HCC)   . Hypercholesterolemia   . HTN (hypertension)   . Polycystic kidney disease   . Supraclinoid carotid artery aneurysm, small (HCC)     Past Surgical History  Procedure Laterality Date  . Thrombectomy      of right upper arm ateriovenous graft with revision   . Rivght ovary removal      secondary to a cyst  . C-sections      x2  . Appendectomy    . Total knee arthroplasty  feb 2005    right knee  . Right oophorectomy        Medication List       This list is accurate as of: 11/12/15 11:59 PM.   Always use your most recent med list.               cinacalcet 30 MG tablet  Commonly known as:  SENSIPAR  Take 30 mg by mouth daily.     digoxin 0.125 MG tablet  Commonly known as:  LANOXIN  TAKE 1 TABLET (125 MCG TOTAL) BY MOUTH 2 (TWO) TIMES A WEEK. ON TUESDAY AND FRIDAY     metoprolol 100 MG tablet  Commonly known as:  LOPRESSOR  Take 1 tablet (100 mg total) by mouth daily.     RENVELA 800 MG tablet  Generic drug:  sevelamer carbonate  Take 2,400 mg by mouth 3 (three) times daily before meals.     TRICOR 145 MG tablet  Generic drug:  fenofibrate  Take 145 mg by mouth daily.     vancomycin 1-5 GM/200ML-% Soln  Commonly known as:  VANCOCIN  Inject 200 mLs (1,000 mg total) into the vein Every Tuesday,Thursday,and 11/01/22 with dialysis. Discontinue after hemodialysis on Tuesday, Thursday and 11/01/2022 for 1 week and then stop.     warfarin 1 MG tablet  Commonly known as:  COUMADIN  Take 1 tablet (1 mg total) by mouth daily at 6 PM. Milligram at bedtime for next 3 days, check INR level on Monday, and adjust dose as needed, patient home dose was 5 mg oral daily except Wednesday was 2.5.  No orders of the defined types were placed in this encounter.    Immunization History  Administered Date(s) Administered  . Tdap 11/03/2015    Social History  Substance Use Topics  . Smoking status: Never Smoker   . Smokeless tobacco: Not on file  . Alcohol Use: Yes     Comment: occasional    Family history is   Family History  Problem Relation Age of Onset  . Stroke Mother   . Atrial fibrillation Father       Review of Systems  DATA OBTAINED: from patient, nurse GENERAL:  no fevers, fatigue, appetite changes SKIN: No itching, rash or wounds EYES: No eye pain, redness, discharge EARS: No earache, tinnitus, change in hearing NOSE: No congestion, drainage or bleeding  MOUTH/THROAT: No mouth or tooth pain, No sore throat RESPIRATORY: No cough, wheezing,  SOB CARDIAC: No chest pain, palpitations, lower extremity edema  GI: No abdominal pain, No N/V/D or constipation, No heartburn or reflux  GU: No dysuria, frequency or urgency, or incontinence  MUSCULOSKELETAL: No unrelieved bone/joint pain NEUROLOGIC: No headache, dizziness or focal weakness PSYCHIATRIC: No c/o anxiety or sadness   Filed Vitals:   11/12/15 1344  BP: 109/71  Pulse: 68  Temp: 97.5 F (36.4 C)  Resp: 17    SpO2 Readings from Last 1 Encounters:  11/08/15 97%        Physical Exam  GENERAL APPEARANCE: Alert, conversant,  No acute distress.  SKIN: No diaphoresis; mild erythema and heat RLE HEAD: Normocephalic, atraumatic  EYES: Conjunctiva/lids clear. Pupils round, reactive. EOMs intact.  EARS: External exam WNL, canals clear. Hearing grossly normal.  NOSE: No deformity or discharge.  MOUTH/THROAT: Lips w/o lesions  RESPIRATORY: Breathing is even, unlabored. Lung sounds are clear   CARDIOVASCULAR: Heart RRR no murmurs, rubs or gallops. trace peripheral edema.   GASTROINTESTINAL: Abdomen is soft, non-tender, not distended w/ normal bowel sounds. GENITOURINARY: Bladder non tender, not distended  MUSCULOSKELETAL: No abnormal joints or musculature NEUROLOGIC:  Cranial nerves 2-12 grossly intact. Moves all extremities  PSYCHIATRIC: Mood and affect appropriate to situation, no behavioral issues  Patient Active Problem List   Diagnosis Date Noted  . Cellulitis of right lower extremity 11/17/2015  . Hyperparathyroidism, secondary renal (HCC) 11/17/2015  . Hyperphosphatemia 11/17/2015  . ESRD (end stage renal disease) (HCC) 11/04/2015  . Cellulitis of right lower leg 11/03/2015  . Encounter for therapeutic drug monitoring 10/11/2014  . ESRD 05/29/2009  . HYPERCHOLESTEROLEMIA 05/28/2009  . Obesity, morbid (HCC) 05/28/2009  . Essential hypertension 05/28/2009  . ATRIAL FIBRILLATION 05/28/2009  . POLYCYSTIC KIDNEY DISEASE 05/28/2009       Component Value  Date/Time   WBC 6.1 11/07/2015 0726   RBC 4.18 11/07/2015 0726   HGB 12.9 11/07/2015 0726   HCT 40.7 11/07/2015 0726   PLT 152 11/07/2015 0726   MCV 97.4 11/07/2015 0726   LYMPHSABS 1.1 11/03/2015 1923   MONOABS 0.6 11/03/2015 1923   EOSABS 0.1 11/03/2015 1923   BASOSABS 0.0 11/03/2015 1923        Component Value Date/Time   NA 138 11/07/2015 0726   K 3.7 11/07/2015 0726   CL 97* 11/07/2015 0726   CO2 27 11/07/2015 0726   GLUCOSE 88 11/07/2015 0726   BUN 18 11/07/2015 0726   CREATININE 7.19* 11/07/2015 0726   CALCIUM 9.1 11/07/2015 0726   PROT 7.1 11/03/2015 1923   ALBUMIN 2.5* 11/07/2015 0726   AST 16 11/03/2015 1923   ALT 12* 11/03/2015 1923   ALKPHOS  61 11/03/2015 1923   BILITOT 0.8 11/03/2015 1923   GFRNONAA 5* 11/07/2015 0726   GFRAA 6* 11/07/2015 0726    No results found for: HGBA1C  No results found for: CHOL, HDL, LDLCALC, LDLDIRECT, TRIG, CHOLHDL   No results found.  Not all labs, radiology exams or other studies done during hospitalization come through on my EPIC note; however they are reviewed by me.    Assessment and Plan  Right lower extremity cellulitis with evidence of fluid overload. cultures Remains with no growth, treated with empiric IV antibiotics which are vancomycin and Zosyn during hospital stay given she has failed outpatient clindamycin treatment. Appears nontoxic and not septic. Dialysis for fluid removal, continue to improve SNF - cont  another week of  IV vancomycin after hemodialysis .  ESRD. On Tuesday, Thursday and Saturday schedule. Missed last run preoperative admission due to son's funeral, PhosLo has been stopped on discharge after discussion with renal. SNF - cont off phoslo; cont   Chronic atrial fibrillation. [place] vasc 2 score of 2.  SNF - Continue Lopressor, digoxin and Coumadin; will be actively titrating coumadin  Essential hypertension. SNF - continue metoprolol 100 mg daily   Morbid obesity SNF - appropriate diet  and OT/PT  Dyslipidemia. 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

## 2015-11-14 ENCOUNTER — Telehealth: Payer: Self-pay | Admitting: *Deleted

## 2015-11-14 DIAGNOSIS — N2581 Secondary hyperparathyroidism of renal origin: Secondary | ICD-10-CM | POA: Diagnosis not present

## 2015-11-14 DIAGNOSIS — L03115 Cellulitis of right lower limb: Secondary | ICD-10-CM | POA: Diagnosis not present

## 2015-11-14 DIAGNOSIS — N186 End stage renal disease: Secondary | ICD-10-CM | POA: Diagnosis not present

## 2015-11-14 NOTE — Telephone Encounter (Signed)
Spoke with [facility] because the pt is due an INR check per her hospital d/c records & our records in [facility].  [facility] stated they are following the pt's INR and they would notify us once the pt is discharged from their facility.

## 2015-11-16 DIAGNOSIS — N2581 Secondary hyperparathyroidism of renal origin: Secondary | ICD-10-CM | POA: Diagnosis not present

## 2015-11-16 DIAGNOSIS — N186 End stage renal disease: Secondary | ICD-10-CM | POA: Diagnosis not present

## 2015-11-16 DIAGNOSIS — L03115 Cellulitis of right lower limb: Secondary | ICD-10-CM | POA: Diagnosis not present

## 2015-11-17 ENCOUNTER — Encounter: Payer: Self-pay | Admitting: Internal Medicine

## 2015-11-17 DIAGNOSIS — N2581 Secondary hyperparathyroidism of renal origin: Secondary | ICD-10-CM | POA: Insufficient documentation

## 2015-11-17 DIAGNOSIS — L03115 Cellulitis of right lower limb: Secondary | ICD-10-CM | POA: Insufficient documentation

## 2015-11-19 DIAGNOSIS — N2581 Secondary hyperparathyroidism of renal origin: Secondary | ICD-10-CM | POA: Diagnosis not present

## 2015-11-19 DIAGNOSIS — L03115 Cellulitis of right lower limb: Secondary | ICD-10-CM | POA: Diagnosis not present

## 2015-11-19 DIAGNOSIS — N186 End stage renal disease: Secondary | ICD-10-CM | POA: Diagnosis not present

## 2015-11-21 DIAGNOSIS — L03115 Cellulitis of right lower limb: Secondary | ICD-10-CM | POA: Diagnosis not present

## 2015-11-21 DIAGNOSIS — N2581 Secondary hyperparathyroidism of renal origin: Secondary | ICD-10-CM | POA: Diagnosis not present

## 2015-11-21 DIAGNOSIS — N186 End stage renal disease: Secondary | ICD-10-CM | POA: Diagnosis not present

## 2015-11-23 DIAGNOSIS — N186 End stage renal disease: Secondary | ICD-10-CM | POA: Diagnosis not present

## 2015-11-23 DIAGNOSIS — L03115 Cellulitis of right lower limb: Secondary | ICD-10-CM | POA: Diagnosis not present

## 2015-11-23 DIAGNOSIS — N2581 Secondary hyperparathyroidism of renal origin: Secondary | ICD-10-CM | POA: Diagnosis not present

## 2015-11-26 DIAGNOSIS — L03115 Cellulitis of right lower limb: Secondary | ICD-10-CM | POA: Diagnosis not present

## 2015-11-26 DIAGNOSIS — N186 End stage renal disease: Secondary | ICD-10-CM | POA: Diagnosis not present

## 2015-11-26 DIAGNOSIS — N2581 Secondary hyperparathyroidism of renal origin: Secondary | ICD-10-CM | POA: Diagnosis not present

## 2015-11-27 DIAGNOSIS — N186 End stage renal disease: Secondary | ICD-10-CM | POA: Diagnosis not present

## 2015-11-27 DIAGNOSIS — Z992 Dependence on renal dialysis: Secondary | ICD-10-CM | POA: Diagnosis not present

## 2015-11-27 DIAGNOSIS — Q612 Polycystic kidney, adult type: Secondary | ICD-10-CM | POA: Diagnosis not present

## 2015-11-28 DIAGNOSIS — N2581 Secondary hyperparathyroidism of renal origin: Secondary | ICD-10-CM | POA: Diagnosis not present

## 2015-11-28 DIAGNOSIS — N186 End stage renal disease: Secondary | ICD-10-CM | POA: Diagnosis not present

## 2015-11-30 DIAGNOSIS — N186 End stage renal disease: Secondary | ICD-10-CM | POA: Diagnosis not present

## 2015-11-30 DIAGNOSIS — N2581 Secondary hyperparathyroidism of renal origin: Secondary | ICD-10-CM | POA: Diagnosis not present

## 2015-12-03 DIAGNOSIS — N186 End stage renal disease: Secondary | ICD-10-CM | POA: Diagnosis not present

## 2015-12-03 DIAGNOSIS — N2581 Secondary hyperparathyroidism of renal origin: Secondary | ICD-10-CM | POA: Diagnosis not present

## 2015-12-05 DIAGNOSIS — N186 End stage renal disease: Secondary | ICD-10-CM | POA: Diagnosis not present

## 2015-12-05 DIAGNOSIS — N2581 Secondary hyperparathyroidism of renal origin: Secondary | ICD-10-CM | POA: Diagnosis not present

## 2015-12-06 ENCOUNTER — Other Ambulatory Visit: Payer: Self-pay | Admitting: Cardiology

## 2015-12-06 NOTE — Telephone Encounter (Signed)
Rx request sent to pharmacy.  

## 2015-12-09 ENCOUNTER — Non-Acute Institutional Stay (SKILLED_NURSING_FACILITY): Payer: Medicare Other | Admitting: Internal Medicine

## 2015-12-09 DIAGNOSIS — N186 End stage renal disease: Secondary | ICD-10-CM

## 2015-12-09 DIAGNOSIS — I10 Essential (primary) hypertension: Secondary | ICD-10-CM

## 2015-12-09 DIAGNOSIS — T82858D Stenosis of vascular prosthetic devices, implants and grafts, subsequent encounter: Secondary | ICD-10-CM | POA: Diagnosis not present

## 2015-12-09 DIAGNOSIS — I4891 Unspecified atrial fibrillation: Secondary | ICD-10-CM

## 2015-12-09 DIAGNOSIS — I871 Compression of vein: Secondary | ICD-10-CM | POA: Diagnosis not present

## 2015-12-09 DIAGNOSIS — L03115 Cellulitis of right lower limb: Secondary | ICD-10-CM | POA: Diagnosis not present

## 2015-12-09 DIAGNOSIS — Z992 Dependence on renal dialysis: Secondary | ICD-10-CM | POA: Diagnosis not present

## 2015-12-09 NOTE — Progress Notes (Signed)
Patient ID: [name] Bender, female   DOB: 02/16/1956, 60 y.o.   MRN: [id number] MRN: [id number] Name: [name] Bender  Sex: female Age: 60 y.o. DOB: 02/27/1956  PSC #:  [facility] Facility/Room: 106 P Level Of Care: SNF Provider: Margit HanksAlexander, Anne D Emergency Contacts: Extended Emergency Contact Information Primary Emergency Contact: Keane PoliceBeaman,Jeremy  [place] Home Phone: [phone] Relation: Son Secondary Emergency Contact: Cranston NeighborBeaman,John Address: [address]          [place], [place] [address] [place] Home Phone: [phone] Relation: Spouse  Code Status: Full Code  Allergies: Ancef and Ancef  Chief Complaint  Patient presents with  . Discharge Note    HPI: Patient is 60 y.o. female with ESRD dialysis d. [facility] for right lower extremity cellulitis. Pt had a good response to IV vancomycin. Pt was admitted to SNF for generalized weakness and finish IV vancomycin. While at SNF pt  followed for HTN, tx with metoprolol, hypercholesterolemia, tx with Tricor and AF, tx with metoprolol, digoxin and coumadin Her stay here has been fairly unremarkable-she will be going home she will be with her sister for a while. She will need home health support for multiple medical issues as well as PT and OT to evaluate treat skilled nursing for medication management and follow-up of her Coumadin.  Also will need a Child psychotherapistsocial worker.  Currently she has no complaints vital signs are stable she is using a walker for ambulation\\  Patient's dialysis shunt apparently did clog and she did have a procedure done at dialysis center this morning-she has returned the facility  .  Past Medical History  Diagnosis Date  . Atrial fibrillation (HCC)   . Hypercholesterolemia   . HTN (hypertension)   . Polycystic kidney disease   . Supraclinoid carotid artery aneurysm, small (HCC)     Past Surgical History  Procedure Laterality Date  . Thrombectomy     of right upper arm ateriovenous graft with revision   . Rivght ovary removal      secondary to a cyst  . C-sections      x2  . Appendectomy    . Total knee arthroplasty  feb 2005    right knee  . Right oophorectomy        Medication List       This list is accurate as of: 12/09/15 12:11 PM.  Always use your most recent med list.               cinacalcet 30 MG tablet  Commonly known as:  SENSIPAR  Take 30 mg by mouth daily.     digoxin 0.125 MG tablet  Commonly known as:  LANOXIN  TAKE 1 TABLET (125 MCG TOTAL) BY MOUTH 2 (TWO) TIMES A WEEK. ON TUESDAY AND FRIDAY     metoprolol 100 MG tablet  Commonly known as:  LOPRESSOR  Take 1 tablet (100 mg total) by mouth daily. Please schedule appointment for refills.     RENVELA 800 MG tablet  Generic drug:  sevelamer carbonate  Take 2,400 mg by mouth 3 (three) times daily before meals.     TRICOR 145 MG tablet  Generic drug:  fenofibrate  Take 145 mg by mouth daily.     vancomycin 1-5 GM/200ML-% Soln  Commonly known as:  VANCOCIN  Inject 200 mLs (1,000 mg total) into the vein Every Tuesday,Thursday,and Saturday with dialysis. Discontinue after hemodialysis on Tuesday, Thursday and Saturday for 1  week and then stop.     warfarin 1 MG tablet  Commonly known as:  COUMADIN  Take 1 tablet (1 mg total) by mouth daily at 6 PM. Milligram at bedtime for next 3 days, check INR level on Monday, and adjust dose as needed, patient home dose was 5 mg oral daily except Wednesday was 2.5.        No orders of the defined types were placed in this encounter.    Immunization History  Administered Date(s) Administered  . Tdap 11/03/2015    Social History  Substance Use Topics  . Smoking status: Never Smoker   . Smokeless tobacco: Not on file  . Alcohol Use: Yes     Comment: occasional    Family history is   Family History  Problem Relation Age of Onset  . Stroke Mother   . Atrial fibrillation Father       Review of  Systems  DATA OBTAINED: from patient, nurse GENERAL:  no fevers, fatigue, appetite changes SKIN: No itching, rash or woundsCellulitis appears resolved right lower leg EYES: No eye pain, redness, discharge EARS: No earache, tinnitus, change in hearing NOSE: No congestion, drainage or bleeding  MOUTH/THROAT: No mouth or tooth pain, No sore throat RESPIRATORY: No cough, wheezing, SOB CARDIAC: No chest pain, palpitationshas baseline , lower extremity edema  GI: No abdominal pain, No N/V/D or constipation, No heartburn or reflux  GU: No dysuria, frequency or urgency, or incontinence  MUSCULOSKELETAL: No unrelieved bone/joint pain NEUROLOGIC: No headache, dizziness or focal weakness PSYCHIATRIC: No c/o anxiety or sadness           Physical Exam She is afebrile pulse is 62 respirations 20 blood pressures 12/58-102/58-115/62 most recently GENERAL APPEARANCE: Alert, conversant,  No acute distress. Eating comfortably in her chair  SKIN: No diaphoresis; low right leg has  pale scaling erythema this appears to be residual from the cellulitis is cool to touch no sign of active cellulitis--on the upper right leg there are 2 small areas where appears catheter was inserted by dialysis both of these look quite benign no sign of infection noted  no drainage no bleeding HEAD: Normocephalic, atraumatic  EYES: Conjunctiva/lids clear. Pupils round, reactive. EOMs intact. She has prescription lenses EARS: External exam WNL, canals clear. Hearing grossly normal.  NOSE: No deformity or discharge.  MOUTH/THROAT: Lips w/o lesions oropharynx clear mucous membranes moist RESPIRATORY: Breathing is even, unlabored. Lung sounds are clear   CARDIOVASCULAR: Heart regular irregular rate no murmurs, rubs or gallops. trace peripheral edema.   GASTROINTESTINAL: Abdomen is soft, non-tender, not distended w/ normal bowel sounds. GENITOURINARY: Bladder non tender, not distended  MUSCULOSKELETAL: No abnormal joints or  musculature--- moves all extremities at baseline NEUROLOGIC:  Cranial nerves 2-12 grossly intact. Moves all extremities  PSYCHIATRIC: Mood and affect appropriate to situation, no behavioral issues continues to be pleasant conversant appropriate  Patient Active Problem List   Diagnosis Date Noted  . Cellulitis of right lower extremity 11/17/2015  . Hyperparathyroidism, secondary renal (HCC) 11/17/2015  . Hyperphosphatemia 11/17/2015  . ESRD (end stage renal disease) (HCC) 11/04/2015  . Cellulitis of right lower leg 11/03/2015  . Encounter for therapeutic drug monitoring 10/11/2014  . ESRD 05/29/2009  . HYPERCHOLESTEROLEMIA 05/28/2009  . Obesity, morbid (HCC) 05/28/2009  . Essential hypertension 05/28/2009  . ATRIAL FIBRILLATION 05/28/2009  . POLYCYSTIC KIDNEY DISEASE 05/28/2009       Component Value Date/Time   WBC 6.1 11/07/2015 0726   RBC 4.18 11/07/2015 0726  HGB 12.9 11/07/2015 0726   HCT 40.7 11/07/2015 0726   PLT 152 11/07/2015 0726   MCV 97.4 11/07/2015 0726   LYMPHSABS 1.1 11/03/2015 1923   MONOABS 0.6 11/03/2015 1923   EOSABS 0.1 11/03/2015 1923   BASOSABS 0.0 11/03/2015 1923        Component Value Date/Time   NA 138 11/07/2015 0726   K 3.7 11/07/2015 0726   CL 97* 11/07/2015 0726   CO2 27 11/07/2015 0726   GLUCOSE 88 11/07/2015 0726   BUN 18 11/07/2015 0726   CREATININE 7.19* 11/07/2015 0726   CALCIUM 9.1 11/07/2015 0726   PROT 7.1 11/03/2015 1923   ALBUMIN 2.5* 11/07/2015 0726   AST 16 11/03/2015 1923   ALT 12* 11/03/2015 1923   ALKPHOS 61 11/03/2015 1923   BILITOT 0.8 11/03/2015 1923   GFRNONAA 5* 11/07/2015 0726   GFRAA 6* 11/07/2015 0726            Assessment and Plan  Right lower extremity cellulitis with evidence of fluid overload. cultures Remains with no growth, treated with empiric IV antibiotics which are vancomycin and Zosyn during hospital stay g Has completed the IV vancomycin in this facility-had previously failed previous  clindamycin treatment.  This appears to be have resolved follow-up with primary care provider as necessary-  ESRD. On Tuesday, Thursday and Saturday schedule.  This has been stable  Chronic atrial fibrillation. [place] vasc 2 score of 2.  SNF - Continue Lopressor, digoxin and Coumadin; INRs therapeutic this will need to be rechecked by home health later this week  Essential hypertension. SNF - continue metoprolol 100 mg daily--this appears to be stable as noted above   Morbid obesity SNF - appropriate diet and OT/PT as well as home health support-she will need PT and OT for continued strengthening with history of weakness and morbid obesity  Dyslipidemia. 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

## 2015-12-10 DIAGNOSIS — N186 End stage renal disease: Secondary | ICD-10-CM | POA: Diagnosis not present

## 2015-12-10 DIAGNOSIS — N2581 Secondary hyperparathyroidism of renal origin: Secondary | ICD-10-CM | POA: Diagnosis not present

## 2015-12-11 DIAGNOSIS — R269 Unspecified abnormalities of gait and mobility: Secondary | ICD-10-CM | POA: Diagnosis not present

## 2015-12-11 DIAGNOSIS — I872 Venous insufficiency (chronic) (peripheral): Secondary | ICD-10-CM | POA: Diagnosis not present

## 2015-12-11 DIAGNOSIS — N186 End stage renal disease: Secondary | ICD-10-CM | POA: Diagnosis not present

## 2015-12-11 DIAGNOSIS — I120 Hypertensive chronic kidney disease with stage 5 chronic kidney disease or end stage renal disease: Secondary | ICD-10-CM | POA: Diagnosis not present

## 2015-12-11 DIAGNOSIS — I482 Chronic atrial fibrillation: Secondary | ICD-10-CM | POA: Diagnosis not present

## 2015-12-12 DIAGNOSIS — N2581 Secondary hyperparathyroidism of renal origin: Secondary | ICD-10-CM | POA: Diagnosis not present

## 2015-12-12 DIAGNOSIS — N186 End stage renal disease: Secondary | ICD-10-CM | POA: Diagnosis not present

## 2015-12-13 DIAGNOSIS — I120 Hypertensive chronic kidney disease with stage 5 chronic kidney disease or end stage renal disease: Secondary | ICD-10-CM | POA: Diagnosis not present

## 2015-12-13 DIAGNOSIS — I872 Venous insufficiency (chronic) (peripheral): Secondary | ICD-10-CM | POA: Diagnosis not present

## 2015-12-13 DIAGNOSIS — N186 End stage renal disease: Secondary | ICD-10-CM | POA: Diagnosis not present

## 2015-12-13 DIAGNOSIS — R269 Unspecified abnormalities of gait and mobility: Secondary | ICD-10-CM | POA: Diagnosis not present

## 2015-12-13 DIAGNOSIS — I482 Chronic atrial fibrillation: Secondary | ICD-10-CM | POA: Diagnosis not present

## 2015-12-14 DIAGNOSIS — N2581 Secondary hyperparathyroidism of renal origin: Secondary | ICD-10-CM | POA: Diagnosis not present

## 2015-12-14 DIAGNOSIS — N186 End stage renal disease: Secondary | ICD-10-CM | POA: Diagnosis not present

## 2015-12-16 DIAGNOSIS — I482 Chronic atrial fibrillation: Secondary | ICD-10-CM | POA: Diagnosis not present

## 2015-12-16 DIAGNOSIS — I872 Venous insufficiency (chronic) (peripheral): Secondary | ICD-10-CM | POA: Diagnosis not present

## 2015-12-16 DIAGNOSIS — R269 Unspecified abnormalities of gait and mobility: Secondary | ICD-10-CM | POA: Diagnosis not present

## 2015-12-16 DIAGNOSIS — I120 Hypertensive chronic kidney disease with stage 5 chronic kidney disease or end stage renal disease: Secondary | ICD-10-CM | POA: Diagnosis not present

## 2015-12-16 DIAGNOSIS — N186 End stage renal disease: Secondary | ICD-10-CM | POA: Diagnosis not present

## 2015-12-17 DIAGNOSIS — N2581 Secondary hyperparathyroidism of renal origin: Secondary | ICD-10-CM | POA: Diagnosis not present

## 2015-12-17 DIAGNOSIS — D631 Anemia in chronic kidney disease: Secondary | ICD-10-CM | POA: Diagnosis not present

## 2015-12-17 DIAGNOSIS — N186 End stage renal disease: Secondary | ICD-10-CM | POA: Diagnosis not present

## 2015-12-18 DIAGNOSIS — I482 Chronic atrial fibrillation: Secondary | ICD-10-CM | POA: Diagnosis not present

## 2015-12-18 DIAGNOSIS — I120 Hypertensive chronic kidney disease with stage 5 chronic kidney disease or end stage renal disease: Secondary | ICD-10-CM | POA: Diagnosis not present

## 2015-12-18 DIAGNOSIS — R269 Unspecified abnormalities of gait and mobility: Secondary | ICD-10-CM | POA: Diagnosis not present

## 2015-12-18 DIAGNOSIS — I872 Venous insufficiency (chronic) (peripheral): Secondary | ICD-10-CM | POA: Diagnosis not present

## 2015-12-18 DIAGNOSIS — N186 End stage renal disease: Secondary | ICD-10-CM | POA: Diagnosis not present

## 2015-12-19 DIAGNOSIS — I4891 Unspecified atrial fibrillation: Secondary | ICD-10-CM | POA: Diagnosis not present

## 2015-12-19 DIAGNOSIS — N2581 Secondary hyperparathyroidism of renal origin: Secondary | ICD-10-CM | POA: Diagnosis not present

## 2015-12-19 DIAGNOSIS — N186 End stage renal disease: Secondary | ICD-10-CM | POA: Diagnosis not present

## 2015-12-19 DIAGNOSIS — D631 Anemia in chronic kidney disease: Secondary | ICD-10-CM | POA: Diagnosis not present

## 2015-12-19 LAB — PROTIME-INR: INR: 1.9 — AB (ref 0.9–1.1)

## 2015-12-20 ENCOUNTER — Ambulatory Visit (INDEPENDENT_AMBULATORY_CARE_PROVIDER_SITE_OTHER): Payer: Medicare Other | Admitting: Cardiovascular Disease

## 2015-12-20 DIAGNOSIS — Z5181 Encounter for therapeutic drug level monitoring: Secondary | ICD-10-CM

## 2015-12-20 DIAGNOSIS — I4891 Unspecified atrial fibrillation: Secondary | ICD-10-CM

## 2015-12-21 DIAGNOSIS — D631 Anemia in chronic kidney disease: Secondary | ICD-10-CM | POA: Diagnosis not present

## 2015-12-21 DIAGNOSIS — N2581 Secondary hyperparathyroidism of renal origin: Secondary | ICD-10-CM | POA: Diagnosis not present

## 2015-12-21 DIAGNOSIS — N186 End stage renal disease: Secondary | ICD-10-CM | POA: Diagnosis not present

## 2015-12-24 DIAGNOSIS — D631 Anemia in chronic kidney disease: Secondary | ICD-10-CM | POA: Diagnosis not present

## 2015-12-24 DIAGNOSIS — I4891 Unspecified atrial fibrillation: Secondary | ICD-10-CM | POA: Diagnosis not present

## 2015-12-24 DIAGNOSIS — N186 End stage renal disease: Secondary | ICD-10-CM | POA: Diagnosis not present

## 2015-12-24 DIAGNOSIS — N2581 Secondary hyperparathyroidism of renal origin: Secondary | ICD-10-CM | POA: Diagnosis not present

## 2015-12-25 DIAGNOSIS — I872 Venous insufficiency (chronic) (peripheral): Secondary | ICD-10-CM | POA: Diagnosis not present

## 2015-12-25 DIAGNOSIS — I120 Hypertensive chronic kidney disease with stage 5 chronic kidney disease or end stage renal disease: Secondary | ICD-10-CM | POA: Diagnosis not present

## 2015-12-25 DIAGNOSIS — I482 Chronic atrial fibrillation: Secondary | ICD-10-CM | POA: Diagnosis not present

## 2015-12-25 DIAGNOSIS — N186 End stage renal disease: Secondary | ICD-10-CM | POA: Diagnosis not present

## 2015-12-25 DIAGNOSIS — R269 Unspecified abnormalities of gait and mobility: Secondary | ICD-10-CM | POA: Diagnosis not present

## 2015-12-26 DIAGNOSIS — D631 Anemia in chronic kidney disease: Secondary | ICD-10-CM | POA: Diagnosis not present

## 2015-12-26 DIAGNOSIS — I4891 Unspecified atrial fibrillation: Secondary | ICD-10-CM | POA: Diagnosis not present

## 2015-12-26 DIAGNOSIS — N186 End stage renal disease: Secondary | ICD-10-CM | POA: Diagnosis not present

## 2015-12-26 DIAGNOSIS — N2581 Secondary hyperparathyroidism of renal origin: Secondary | ICD-10-CM | POA: Diagnosis not present

## 2015-12-26 LAB — PROTIME-INR: INR: 1.8 — AB (ref 0.9–1.1)

## 2015-12-27 DIAGNOSIS — N186 End stage renal disease: Secondary | ICD-10-CM | POA: Diagnosis not present

## 2015-12-27 DIAGNOSIS — Q612 Polycystic kidney, adult type: Secondary | ICD-10-CM | POA: Diagnosis not present

## 2015-12-27 DIAGNOSIS — Z992 Dependence on renal dialysis: Secondary | ICD-10-CM | POA: Diagnosis not present

## 2015-12-28 DIAGNOSIS — N186 End stage renal disease: Secondary | ICD-10-CM | POA: Diagnosis not present

## 2015-12-28 DIAGNOSIS — N2581 Secondary hyperparathyroidism of renal origin: Secondary | ICD-10-CM | POA: Diagnosis not present

## 2015-12-28 DIAGNOSIS — D631 Anemia in chronic kidney disease: Secondary | ICD-10-CM | POA: Diagnosis not present

## 2015-12-30 ENCOUNTER — Ambulatory Visit (INDEPENDENT_AMBULATORY_CARE_PROVIDER_SITE_OTHER): Payer: Medicare Other | Admitting: Internal Medicine

## 2015-12-30 DIAGNOSIS — Z5181 Encounter for therapeutic drug level monitoring: Secondary | ICD-10-CM

## 2015-12-30 DIAGNOSIS — I4891 Unspecified atrial fibrillation: Secondary | ICD-10-CM

## 2016-01-01 DIAGNOSIS — N186 End stage renal disease: Secondary | ICD-10-CM | POA: Diagnosis not present

## 2016-01-01 DIAGNOSIS — I120 Hypertensive chronic kidney disease with stage 5 chronic kidney disease or end stage renal disease: Secondary | ICD-10-CM | POA: Diagnosis not present

## 2016-01-01 DIAGNOSIS — I872 Venous insufficiency (chronic) (peripheral): Secondary | ICD-10-CM | POA: Diagnosis not present

## 2016-01-01 DIAGNOSIS — R269 Unspecified abnormalities of gait and mobility: Secondary | ICD-10-CM | POA: Diagnosis not present

## 2016-01-01 DIAGNOSIS — I482 Chronic atrial fibrillation: Secondary | ICD-10-CM | POA: Diagnosis not present

## 2016-01-03 DIAGNOSIS — I482 Chronic atrial fibrillation: Secondary | ICD-10-CM | POA: Diagnosis not present

## 2016-01-03 DIAGNOSIS — I120 Hypertensive chronic kidney disease with stage 5 chronic kidney disease or end stage renal disease: Secondary | ICD-10-CM | POA: Diagnosis not present

## 2016-01-03 DIAGNOSIS — R269 Unspecified abnormalities of gait and mobility: Secondary | ICD-10-CM | POA: Diagnosis not present

## 2016-01-03 DIAGNOSIS — I872 Venous insufficiency (chronic) (peripheral): Secondary | ICD-10-CM | POA: Diagnosis not present

## 2016-01-03 DIAGNOSIS — N186 End stage renal disease: Secondary | ICD-10-CM | POA: Diagnosis not present

## 2016-01-06 DIAGNOSIS — I872 Venous insufficiency (chronic) (peripheral): Secondary | ICD-10-CM | POA: Diagnosis not present

## 2016-01-06 DIAGNOSIS — I482 Chronic atrial fibrillation: Secondary | ICD-10-CM | POA: Diagnosis not present

## 2016-01-06 DIAGNOSIS — R269 Unspecified abnormalities of gait and mobility: Secondary | ICD-10-CM | POA: Diagnosis not present

## 2016-01-06 DIAGNOSIS — I120 Hypertensive chronic kidney disease with stage 5 chronic kidney disease or end stage renal disease: Secondary | ICD-10-CM | POA: Diagnosis not present

## 2016-01-06 DIAGNOSIS — N186 End stage renal disease: Secondary | ICD-10-CM | POA: Diagnosis not present

## 2016-01-07 DIAGNOSIS — I4891 Unspecified atrial fibrillation: Secondary | ICD-10-CM | POA: Diagnosis not present

## 2016-01-07 LAB — PROTIME-INR: INR: 2 — AB (ref 0.9–1.1)

## 2016-01-08 DIAGNOSIS — R269 Unspecified abnormalities of gait and mobility: Secondary | ICD-10-CM | POA: Diagnosis not present

## 2016-01-08 DIAGNOSIS — N186 End stage renal disease: Secondary | ICD-10-CM | POA: Diagnosis not present

## 2016-01-08 DIAGNOSIS — I120 Hypertensive chronic kidney disease with stage 5 chronic kidney disease or end stage renal disease: Secondary | ICD-10-CM | POA: Diagnosis not present

## 2016-01-08 DIAGNOSIS — I482 Chronic atrial fibrillation: Secondary | ICD-10-CM | POA: Diagnosis not present

## 2016-01-08 DIAGNOSIS — I872 Venous insufficiency (chronic) (peripheral): Secondary | ICD-10-CM | POA: Diagnosis not present

## 2016-01-09 ENCOUNTER — Ambulatory Visit (INDEPENDENT_AMBULATORY_CARE_PROVIDER_SITE_OTHER): Payer: Medicare Other | Admitting: Internal Medicine

## 2016-01-09 DIAGNOSIS — I4891 Unspecified atrial fibrillation: Secondary | ICD-10-CM

## 2016-01-09 DIAGNOSIS — Z5181 Encounter for therapeutic drug level monitoring: Secondary | ICD-10-CM

## 2016-01-10 DIAGNOSIS — I120 Hypertensive chronic kidney disease with stage 5 chronic kidney disease or end stage renal disease: Secondary | ICD-10-CM | POA: Diagnosis not present

## 2016-01-10 DIAGNOSIS — I482 Chronic atrial fibrillation: Secondary | ICD-10-CM | POA: Diagnosis not present

## 2016-01-10 DIAGNOSIS — R269 Unspecified abnormalities of gait and mobility: Secondary | ICD-10-CM | POA: Diagnosis not present

## 2016-01-10 DIAGNOSIS — I872 Venous insufficiency (chronic) (peripheral): Secondary | ICD-10-CM | POA: Diagnosis not present

## 2016-01-10 DIAGNOSIS — N186 End stage renal disease: Secondary | ICD-10-CM | POA: Diagnosis not present

## 2016-01-13 DIAGNOSIS — N186 End stage renal disease: Secondary | ICD-10-CM | POA: Diagnosis not present

## 2016-01-13 DIAGNOSIS — R269 Unspecified abnormalities of gait and mobility: Secondary | ICD-10-CM | POA: Diagnosis not present

## 2016-01-13 DIAGNOSIS — I120 Hypertensive chronic kidney disease with stage 5 chronic kidney disease or end stage renal disease: Secondary | ICD-10-CM | POA: Diagnosis not present

## 2016-01-13 DIAGNOSIS — I872 Venous insufficiency (chronic) (peripheral): Secondary | ICD-10-CM | POA: Diagnosis not present

## 2016-01-13 DIAGNOSIS — I482 Chronic atrial fibrillation: Secondary | ICD-10-CM | POA: Diagnosis not present

## 2016-01-14 DIAGNOSIS — I4891 Unspecified atrial fibrillation: Secondary | ICD-10-CM | POA: Diagnosis not present

## 2016-01-17 DIAGNOSIS — N186 End stage renal disease: Secondary | ICD-10-CM | POA: Diagnosis not present

## 2016-01-17 DIAGNOSIS — I482 Chronic atrial fibrillation: Secondary | ICD-10-CM | POA: Diagnosis not present

## 2016-01-17 DIAGNOSIS — R269 Unspecified abnormalities of gait and mobility: Secondary | ICD-10-CM | POA: Diagnosis not present

## 2016-01-17 DIAGNOSIS — I120 Hypertensive chronic kidney disease with stage 5 chronic kidney disease or end stage renal disease: Secondary | ICD-10-CM | POA: Diagnosis not present

## 2016-01-17 DIAGNOSIS — I872 Venous insufficiency (chronic) (peripheral): Secondary | ICD-10-CM | POA: Diagnosis not present

## 2016-01-21 DIAGNOSIS — I4891 Unspecified atrial fibrillation: Secondary | ICD-10-CM | POA: Diagnosis not present

## 2016-01-21 LAB — PROTIME-INR: INR: 2 — AB (ref 0.9–1.1)

## 2016-01-22 ENCOUNTER — Other Ambulatory Visit: Payer: Self-pay | Admitting: *Deleted

## 2016-01-22 ENCOUNTER — Ambulatory Visit (INDEPENDENT_AMBULATORY_CARE_PROVIDER_SITE_OTHER): Payer: Medicare Other | Admitting: Cardiovascular Disease

## 2016-01-22 ENCOUNTER — Other Ambulatory Visit: Payer: Self-pay

## 2016-01-22 DIAGNOSIS — I4891 Unspecified atrial fibrillation: Secondary | ICD-10-CM

## 2016-01-22 DIAGNOSIS — Z5181 Encounter for therapeutic drug level monitoring: Secondary | ICD-10-CM

## 2016-01-22 MED ORDER — METOPROLOL TARTRATE 100 MG PO TABS: 100.0000 mg | ORAL_TABLET | Freq: Every day | ORAL | 0 refills | Status: DC

## 2016-01-22 MED ORDER — WARFARIN SODIUM 5 MG PO TABS: ORAL_TABLET | ORAL | 3 refills | Status: DC

## 2016-01-23 DIAGNOSIS — I4891 Unspecified atrial fibrillation: Secondary | ICD-10-CM | POA: Diagnosis not present

## 2016-01-24 DIAGNOSIS — I872 Venous insufficiency (chronic) (peripheral): Secondary | ICD-10-CM | POA: Diagnosis not present

## 2016-01-24 DIAGNOSIS — R269 Unspecified abnormalities of gait and mobility: Secondary | ICD-10-CM | POA: Diagnosis not present

## 2016-01-24 DIAGNOSIS — N186 End stage renal disease: Secondary | ICD-10-CM | POA: Diagnosis not present

## 2016-01-24 DIAGNOSIS — I120 Hypertensive chronic kidney disease with stage 5 chronic kidney disease or end stage renal disease: Secondary | ICD-10-CM | POA: Diagnosis not present

## 2016-01-24 DIAGNOSIS — I482 Chronic atrial fibrillation: Secondary | ICD-10-CM | POA: Diagnosis not present

## 2016-01-27 DIAGNOSIS — Z992 Dependence on renal dialysis: Secondary | ICD-10-CM | POA: Diagnosis not present

## 2016-01-27 DIAGNOSIS — Q612 Polycystic kidney, adult type: Secondary | ICD-10-CM | POA: Diagnosis not present

## 2016-01-27 DIAGNOSIS — N186 End stage renal disease: Secondary | ICD-10-CM | POA: Diagnosis not present

## 2016-01-28 DIAGNOSIS — N186 End stage renal disease: Secondary | ICD-10-CM | POA: Diagnosis not present

## 2016-01-28 DIAGNOSIS — N2581 Secondary hyperparathyroidism of renal origin: Secondary | ICD-10-CM | POA: Diagnosis not present

## 2016-01-30 DIAGNOSIS — N2581 Secondary hyperparathyroidism of renal origin: Secondary | ICD-10-CM | POA: Diagnosis not present

## 2016-01-30 DIAGNOSIS — N186 End stage renal disease: Secondary | ICD-10-CM | POA: Diagnosis not present

## 2016-02-01 DIAGNOSIS — N2581 Secondary hyperparathyroidism of renal origin: Secondary | ICD-10-CM | POA: Diagnosis not present

## 2016-02-01 DIAGNOSIS — N186 End stage renal disease: Secondary | ICD-10-CM | POA: Diagnosis not present

## 2016-02-04 DIAGNOSIS — N186 End stage renal disease: Secondary | ICD-10-CM | POA: Diagnosis not present

## 2016-02-04 DIAGNOSIS — N2581 Secondary hyperparathyroidism of renal origin: Secondary | ICD-10-CM | POA: Diagnosis not present

## 2016-02-06 DIAGNOSIS — N2581 Secondary hyperparathyroidism of renal origin: Secondary | ICD-10-CM | POA: Diagnosis not present

## 2016-02-06 DIAGNOSIS — N186 End stage renal disease: Secondary | ICD-10-CM | POA: Diagnosis not present

## 2016-02-08 DIAGNOSIS — N2581 Secondary hyperparathyroidism of renal origin: Secondary | ICD-10-CM | POA: Diagnosis not present

## 2016-02-08 DIAGNOSIS — N186 End stage renal disease: Secondary | ICD-10-CM | POA: Diagnosis not present

## 2016-02-11 DIAGNOSIS — N2581 Secondary hyperparathyroidism of renal origin: Secondary | ICD-10-CM | POA: Diagnosis not present

## 2016-02-11 DIAGNOSIS — I4891 Unspecified atrial fibrillation: Secondary | ICD-10-CM | POA: Diagnosis not present

## 2016-02-11 DIAGNOSIS — N186 End stage renal disease: Secondary | ICD-10-CM | POA: Diagnosis not present

## 2016-02-11 LAB — PROTIME-INR: INR: 3.1 — AB (ref 0.9–1.1)

## 2016-02-12 ENCOUNTER — Ambulatory Visit (INDEPENDENT_AMBULATORY_CARE_PROVIDER_SITE_OTHER): Payer: Medicare Other | Admitting: Cardiovascular Disease

## 2016-02-12 DIAGNOSIS — I4891 Unspecified atrial fibrillation: Secondary | ICD-10-CM

## 2016-02-12 DIAGNOSIS — Z5181 Encounter for therapeutic drug level monitoring: Secondary | ICD-10-CM

## 2016-02-13 DIAGNOSIS — N186 End stage renal disease: Secondary | ICD-10-CM | POA: Diagnosis not present

## 2016-02-13 DIAGNOSIS — N2581 Secondary hyperparathyroidism of renal origin: Secondary | ICD-10-CM | POA: Diagnosis not present

## 2016-02-15 DIAGNOSIS — N2581 Secondary hyperparathyroidism of renal origin: Secondary | ICD-10-CM | POA: Diagnosis not present

## 2016-02-15 DIAGNOSIS — N186 End stage renal disease: Secondary | ICD-10-CM | POA: Diagnosis not present

## 2016-02-18 DIAGNOSIS — N186 End stage renal disease: Secondary | ICD-10-CM | POA: Diagnosis not present

## 2016-02-18 DIAGNOSIS — N2581 Secondary hyperparathyroidism of renal origin: Secondary | ICD-10-CM | POA: Diagnosis not present

## 2016-02-20 DIAGNOSIS — I4891 Unspecified atrial fibrillation: Secondary | ICD-10-CM | POA: Diagnosis not present

## 2016-02-20 DIAGNOSIS — N186 End stage renal disease: Secondary | ICD-10-CM | POA: Diagnosis not present

## 2016-02-20 DIAGNOSIS — N2581 Secondary hyperparathyroidism of renal origin: Secondary | ICD-10-CM | POA: Diagnosis not present

## 2016-02-22 DIAGNOSIS — N186 End stage renal disease: Secondary | ICD-10-CM | POA: Diagnosis not present

## 2016-02-22 DIAGNOSIS — N2581 Secondary hyperparathyroidism of renal origin: Secondary | ICD-10-CM | POA: Diagnosis not present

## 2016-02-24 ENCOUNTER — Other Ambulatory Visit: Payer: Self-pay | Admitting: *Deleted

## 2016-02-24 ENCOUNTER — Other Ambulatory Visit: Payer: Self-pay | Admitting: Cardiology

## 2016-02-24 MED ORDER — METOPROLOL TARTRATE 100 MG PO TABS: 100.0000 mg | ORAL_TABLET | Freq: Every day | ORAL | 0 refills | Status: DC

## 2016-02-25 DIAGNOSIS — N186 End stage renal disease: Secondary | ICD-10-CM | POA: Diagnosis not present

## 2016-02-25 DIAGNOSIS — N2581 Secondary hyperparathyroidism of renal origin: Secondary | ICD-10-CM | POA: Diagnosis not present

## 2016-02-27 DIAGNOSIS — N2581 Secondary hyperparathyroidism of renal origin: Secondary | ICD-10-CM | POA: Diagnosis not present

## 2016-02-27 DIAGNOSIS — Z992 Dependence on renal dialysis: Secondary | ICD-10-CM | POA: Diagnosis not present

## 2016-02-27 DIAGNOSIS — N186 End stage renal disease: Secondary | ICD-10-CM | POA: Diagnosis not present

## 2016-02-27 DIAGNOSIS — Q612 Polycystic kidney, adult type: Secondary | ICD-10-CM | POA: Diagnosis not present

## 2016-02-29 DIAGNOSIS — N2581 Secondary hyperparathyroidism of renal origin: Secondary | ICD-10-CM | POA: Diagnosis not present

## 2016-02-29 DIAGNOSIS — N186 End stage renal disease: Secondary | ICD-10-CM | POA: Diagnosis not present

## 2016-03-03 DIAGNOSIS — N2581 Secondary hyperparathyroidism of renal origin: Secondary | ICD-10-CM | POA: Diagnosis not present

## 2016-03-03 DIAGNOSIS — I4891 Unspecified atrial fibrillation: Secondary | ICD-10-CM | POA: Diagnosis not present

## 2016-03-03 DIAGNOSIS — N186 End stage renal disease: Secondary | ICD-10-CM | POA: Diagnosis not present

## 2016-03-03 LAB — PROTIME-INR: INR: 1.6 — AB (ref 0.9–1.1)

## 2016-03-04 NOTE — Progress Notes (Signed)
HPI: FU atrial fibrillation. Not seen since 10/15. Note a previous nuclear study in October of 2004 showed breast attenuation but no ischemia. The ejection fraction was 44% but may have been inaccurate due to the patient's atrial fibrillation. Her last echocardiogram in April of 2007 showed normal LV function, trivial aortic insufficiency and mitral regurgitation. The right atrium was mildly to moderately dilated and the left atrium was moderately to markedly dilated. Atrial fibrillation is treated with rate control and anticoagulation. Holter monitor and echocardiogram ordered at last office visit but not performed. Patient is not compliant with follow-up. Since last seen, She denies dyspnea, chest pain, palpitations or syncope.Occasional chronic pedal edema. She has some depression and she lost her son and husband in the last 5 months.  Current Outpatient Prescriptions  Medication Sig Dispense Refill  . b complex-vitamin c-folic acid (NEPHRO-VITE) 0.8 MG TABS tablet Take 1 tablet by mouth at bedtime.    . cinacalcet (SENSIPAR) 30 MG tablet Take 30 mg by mouth daily.      . digoxin (LANOXIN) 0.125 MG tablet TAKE 1 TABLET (125 MCG TOTAL) BY MOUTH 2 (TWO) TIMES A WEEK. ON TUESDAY AND FRIDAY 48 tablet 0  . fenofibrate (TRICOR) 145 MG tablet Take 145 mg by mouth daily.      . metoprolol (LOPRESSOR) 100 MG tablet Take 1 tablet (100 mg total) by mouth daily. 30 tablet 0  . sertraline (ZOLOFT) 50 MG tablet Take 50 mg by mouth daily.    . sevelamer (RENVELA) 800 MG tablet Take 2,400 mg by mouth 3 (three) times daily before meals.     . warfarin (COUMADIN) 5 MG tablet Take 5mg  on Wed & Sat & 2.5mg  all other days 30 tablet 3   No current facility-administered medications for this visit.      Past Medical History:  Diagnosis Date  . Atrial fibrillation (HCC)   . HTN (hypertension)   . Hypercholesterolemia   . Polycystic kidney disease   . Supraclinoid carotid artery aneurysm, small (HCC)      Past Surgical History:  Procedure Laterality Date  . APPENDECTOMY    . c-sections     x2  . RIGHT OOPHORECTOMY    . rivght ovary removal     secondary to a cyst  . THROMBECTOMY     of right upper arm ateriovenous graft with revision   . TOTAL KNEE ARTHROPLASTY  feb 2005   right knee    Social History   Social History  . Marital status: Married    Spouse name: N/A  . Number of children: 2  . Years of education: N/A   Occupational History  . Not on file.   Social History Main Topics  . Smoking status: Never Smoker  . Smokeless tobacco: Never Used  . Alcohol use Yes     Comment: occasional  . Drug use: Unknown  . Sexual activity: Not on file   Other Topics Concern  . Not on file   Social History Narrative  . No narrative on file    Family History  Problem Relation Age of Onset  . Atrial fibrillation Father   . Stroke Mother     ROS: no fevers or chills, productive cough, hemoptysis, dysphasia, odynophagia, melena, hematochezia, dysuria, hematuria, rash, seizure activity, orthopnea, PND, claudication. Remaining systems are negative.  Physical Exam: Well-developed Chronically ill-appearing in no acute distress.  Skin is warm and dry.  HEENT is normal.  Neck is supple.  Chest is  clear to auscultation with normal expansion.  Cardiovascular exam is irregular Abdominal exam nontender or distended. Large abdominal hernia Extremities show Trace edema, chronic skin changes. neuro grossly intact  ECG-Atrial fibrillation at a rate of 92. Right axis deviation. Nonspecific ST changes.  A/P  1 Hypertension-blood pressure controlled. Continue present medications.  2 end-stage renal disease-managed by nephrology.  3 permanent atrial fibrillation-Patient remains in permanent atrial fibrillation. Continue digoxin at renal dose and metoprolol for rate control. Check 24-hour Holter monitor. Repeat echocardiogram. Continue Coumadin.  Olga Millers, MD

## 2016-03-05 DIAGNOSIS — N186 End stage renal disease: Secondary | ICD-10-CM | POA: Diagnosis not present

## 2016-03-05 DIAGNOSIS — N2581 Secondary hyperparathyroidism of renal origin: Secondary | ICD-10-CM | POA: Diagnosis not present

## 2016-03-06 ENCOUNTER — Ambulatory Visit (INDEPENDENT_AMBULATORY_CARE_PROVIDER_SITE_OTHER): Payer: Medicare Other | Admitting: Interventional Cardiology

## 2016-03-06 DIAGNOSIS — I4891 Unspecified atrial fibrillation: Secondary | ICD-10-CM

## 2016-03-06 DIAGNOSIS — Z5181 Encounter for therapeutic drug level monitoring: Secondary | ICD-10-CM

## 2016-03-07 DIAGNOSIS — N2581 Secondary hyperparathyroidism of renal origin: Secondary | ICD-10-CM | POA: Diagnosis not present

## 2016-03-07 DIAGNOSIS — N186 End stage renal disease: Secondary | ICD-10-CM | POA: Diagnosis not present

## 2016-03-10 DIAGNOSIS — N186 End stage renal disease: Secondary | ICD-10-CM | POA: Diagnosis not present

## 2016-03-10 DIAGNOSIS — N2581 Secondary hyperparathyroidism of renal origin: Secondary | ICD-10-CM | POA: Diagnosis not present

## 2016-03-11 ENCOUNTER — Encounter: Payer: Self-pay | Admitting: Cardiology

## 2016-03-11 ENCOUNTER — Ambulatory Visit (INDEPENDENT_AMBULATORY_CARE_PROVIDER_SITE_OTHER): Payer: Medicare Other | Admitting: Cardiology

## 2016-03-11 VITALS — BP 124/64 | HR 92 | Ht 63.0 in | Wt 210.0 lb

## 2016-03-11 DIAGNOSIS — I10 Essential (primary) hypertension: Secondary | ICD-10-CM | POA: Diagnosis not present

## 2016-03-11 DIAGNOSIS — I4891 Unspecified atrial fibrillation: Secondary | ICD-10-CM | POA: Diagnosis not present

## 2016-03-11 NOTE — Patient Instructions (Signed)
Medication Instructions:   NO CHANGE  Testing/Procedures:  Your physician has recommended that you wear a 24 HOUR holter monitor. Holter monitors are medical devices that record the heart's electrical activity. Doctors most often use these monitors to diagnose arrhythmias. Arrhythmias are problems with the speed or rhythm of the heartbeat. The monitor is a small, portable device. You can wear one while you do your normal daily activities. This is usually used to diagnose what is causing palpitations/syncope (passing out).   Your physician has requested that you have an echocardiogram. Echocardiography is a painless test that uses sound waves to create images of your heart. It provides your doctor with information about the size and shape of your heart and how well your heart's chambers and valves are working. This procedure takes approximately one hour. There are no restrictions for this procedure.    Follow-Up:  Your physician wants you to follow-up in: ONE YEAR WITH DR Shelda PalRENSHAW You will receive a reminder letter in the mail two months in advance. If you don't receive a letter, please call our office to schedule the follow-up appointment.   If you need a refill on your cardiac medications before your next appointment, please call your pharmacy.

## 2016-03-12 DIAGNOSIS — N2581 Secondary hyperparathyroidism of renal origin: Secondary | ICD-10-CM | POA: Diagnosis not present

## 2016-03-12 DIAGNOSIS — N186 End stage renal disease: Secondary | ICD-10-CM | POA: Diagnosis not present

## 2016-03-13 ENCOUNTER — Encounter: Payer: Self-pay | Admitting: Cardiology

## 2016-03-14 DIAGNOSIS — N2581 Secondary hyperparathyroidism of renal origin: Secondary | ICD-10-CM | POA: Diagnosis not present

## 2016-03-14 DIAGNOSIS — N186 End stage renal disease: Secondary | ICD-10-CM | POA: Diagnosis not present

## 2016-03-17 DIAGNOSIS — I4891 Unspecified atrial fibrillation: Secondary | ICD-10-CM | POA: Diagnosis not present

## 2016-03-17 DIAGNOSIS — N2581 Secondary hyperparathyroidism of renal origin: Secondary | ICD-10-CM | POA: Diagnosis not present

## 2016-03-17 DIAGNOSIS — N186 End stage renal disease: Secondary | ICD-10-CM | POA: Diagnosis not present

## 2016-03-18 LAB — PROTIME-INR: INR: 2.2 — AB (ref 0.9–1.1)

## 2016-03-19 ENCOUNTER — Ambulatory Visit (INDEPENDENT_AMBULATORY_CARE_PROVIDER_SITE_OTHER): Payer: Medicare Other | Admitting: Cardiovascular Disease

## 2016-03-19 DIAGNOSIS — Z5181 Encounter for therapeutic drug level monitoring: Secondary | ICD-10-CM

## 2016-03-19 DIAGNOSIS — I4891 Unspecified atrial fibrillation: Secondary | ICD-10-CM

## 2016-03-21 DIAGNOSIS — N2581 Secondary hyperparathyroidism of renal origin: Secondary | ICD-10-CM | POA: Diagnosis not present

## 2016-03-21 DIAGNOSIS — N186 End stage renal disease: Secondary | ICD-10-CM | POA: Diagnosis not present

## 2016-03-24 DIAGNOSIS — N2581 Secondary hyperparathyroidism of renal origin: Secondary | ICD-10-CM | POA: Diagnosis not present

## 2016-03-24 DIAGNOSIS — N186 End stage renal disease: Secondary | ICD-10-CM | POA: Diagnosis not present

## 2016-03-26 DIAGNOSIS — N2581 Secondary hyperparathyroidism of renal origin: Secondary | ICD-10-CM | POA: Diagnosis not present

## 2016-03-26 DIAGNOSIS — I4891 Unspecified atrial fibrillation: Secondary | ICD-10-CM | POA: Diagnosis not present

## 2016-03-26 DIAGNOSIS — N186 End stage renal disease: Secondary | ICD-10-CM | POA: Diagnosis not present

## 2016-03-26 LAB — PROTIME-INR: INR: 1.7 — AB (ref 0.9–1.1)

## 2016-03-27 ENCOUNTER — Ambulatory Visit (INDEPENDENT_AMBULATORY_CARE_PROVIDER_SITE_OTHER): Payer: Medicare Other

## 2016-03-27 ENCOUNTER — Ambulatory Visit (HOSPITAL_COMMUNITY): Payer: Medicare Other | Attending: Cardiology

## 2016-03-27 ENCOUNTER — Other Ambulatory Visit: Payer: Self-pay

## 2016-03-27 DIAGNOSIS — I119 Hypertensive heart disease without heart failure: Secondary | ICD-10-CM | POA: Diagnosis not present

## 2016-03-27 DIAGNOSIS — I059 Rheumatic mitral valve disease, unspecified: Secondary | ICD-10-CM | POA: Insufficient documentation

## 2016-03-27 DIAGNOSIS — I313 Pericardial effusion (noninflammatory): Secondary | ICD-10-CM | POA: Diagnosis not present

## 2016-03-27 DIAGNOSIS — I4891 Unspecified atrial fibrillation: Secondary | ICD-10-CM | POA: Diagnosis not present

## 2016-03-27 DIAGNOSIS — E7800 Pure hypercholesterolemia, unspecified: Secondary | ICD-10-CM | POA: Insufficient documentation

## 2016-03-28 DIAGNOSIS — N2581 Secondary hyperparathyroidism of renal origin: Secondary | ICD-10-CM | POA: Diagnosis not present

## 2016-03-28 DIAGNOSIS — N186 End stage renal disease: Secondary | ICD-10-CM | POA: Diagnosis not present

## 2016-03-28 DIAGNOSIS — Q612 Polycystic kidney, adult type: Secondary | ICD-10-CM | POA: Diagnosis not present

## 2016-03-28 DIAGNOSIS — Z992 Dependence on renal dialysis: Secondary | ICD-10-CM | POA: Diagnosis not present

## 2016-03-29 ENCOUNTER — Other Ambulatory Visit: Payer: Self-pay | Admitting: Cardiology

## 2016-03-30 ENCOUNTER — Other Ambulatory Visit: Payer: Self-pay | Admitting: Cardiology

## 2016-03-30 ENCOUNTER — Ambulatory Visit (INDEPENDENT_AMBULATORY_CARE_PROVIDER_SITE_OTHER): Payer: Medicare Other | Admitting: Interventional Cardiology

## 2016-03-30 DIAGNOSIS — I4891 Unspecified atrial fibrillation: Secondary | ICD-10-CM

## 2016-03-30 DIAGNOSIS — Z5181 Encounter for therapeutic drug level monitoring: Secondary | ICD-10-CM

## 2016-03-30 MED ORDER — METOPROLOL TARTRATE 100 MG PO TABS: 100.0000 mg | ORAL_TABLET | Freq: Every day | ORAL | 11 refills | Status: DC

## 2016-03-30 MED ORDER — METOPROLOL TARTRATE 100 MG PO TABS: 100.0000 mg | ORAL_TABLET | Freq: Every day | ORAL | 0 refills | Status: AC

## 2016-03-30 NOTE — Telephone Encounter (Signed)
Ok  [name] Bender  

## 2016-03-30 NOTE — Telephone Encounter (Signed)
Rx request sent to pharmacy.  

## 2016-03-31 DIAGNOSIS — N2581 Secondary hyperparathyroidism of renal origin: Secondary | ICD-10-CM | POA: Diagnosis not present

## 2016-03-31 DIAGNOSIS — N186 End stage renal disease: Secondary | ICD-10-CM | POA: Diagnosis not present

## 2016-03-31 DIAGNOSIS — Z23 Encounter for immunization: Secondary | ICD-10-CM | POA: Diagnosis not present

## 2016-04-02 DIAGNOSIS — Z23 Encounter for immunization: Secondary | ICD-10-CM | POA: Diagnosis not present

## 2016-04-02 DIAGNOSIS — N186 End stage renal disease: Secondary | ICD-10-CM | POA: Diagnosis not present

## 2016-04-02 DIAGNOSIS — N2581 Secondary hyperparathyroidism of renal origin: Secondary | ICD-10-CM | POA: Diagnosis not present

## 2016-04-04 DIAGNOSIS — N2581 Secondary hyperparathyroidism of renal origin: Secondary | ICD-10-CM | POA: Diagnosis not present

## 2016-04-04 DIAGNOSIS — Z23 Encounter for immunization: Secondary | ICD-10-CM | POA: Diagnosis not present

## 2016-04-04 DIAGNOSIS — N186 End stage renal disease: Secondary | ICD-10-CM | POA: Diagnosis not present

## 2016-04-07 DIAGNOSIS — N2581 Secondary hyperparathyroidism of renal origin: Secondary | ICD-10-CM | POA: Diagnosis not present

## 2016-04-07 DIAGNOSIS — I4891 Unspecified atrial fibrillation: Secondary | ICD-10-CM | POA: Diagnosis not present

## 2016-04-07 DIAGNOSIS — N186 End stage renal disease: Secondary | ICD-10-CM | POA: Diagnosis not present

## 2016-04-07 DIAGNOSIS — Z23 Encounter for immunization: Secondary | ICD-10-CM | POA: Diagnosis not present

## 2016-04-07 LAB — PROTIME-INR: INR: 2.4 — AB (ref 0.9–1.1)

## 2016-04-08 ENCOUNTER — Ambulatory Visit (INDEPENDENT_AMBULATORY_CARE_PROVIDER_SITE_OTHER): Payer: Medicare Other | Admitting: Internal Medicine

## 2016-04-08 DIAGNOSIS — I4891 Unspecified atrial fibrillation: Secondary | ICD-10-CM

## 2016-04-08 DIAGNOSIS — Z5181 Encounter for therapeutic drug level monitoring: Secondary | ICD-10-CM

## 2016-04-09 DIAGNOSIS — Z23 Encounter for immunization: Secondary | ICD-10-CM | POA: Diagnosis not present

## 2016-04-09 DIAGNOSIS — N186 End stage renal disease: Secondary | ICD-10-CM | POA: Diagnosis not present

## 2016-04-09 DIAGNOSIS — N2581 Secondary hyperparathyroidism of renal origin: Secondary | ICD-10-CM | POA: Diagnosis not present

## 2016-04-11 DIAGNOSIS — N2581 Secondary hyperparathyroidism of renal origin: Secondary | ICD-10-CM | POA: Diagnosis not present

## 2016-04-11 DIAGNOSIS — N186 End stage renal disease: Secondary | ICD-10-CM | POA: Diagnosis not present

## 2016-04-11 DIAGNOSIS — Z23 Encounter for immunization: Secondary | ICD-10-CM | POA: Diagnosis not present

## 2016-04-14 DIAGNOSIS — N186 End stage renal disease: Secondary | ICD-10-CM | POA: Diagnosis not present

## 2016-04-14 DIAGNOSIS — N2581 Secondary hyperparathyroidism of renal origin: Secondary | ICD-10-CM | POA: Diagnosis not present

## 2016-04-14 DIAGNOSIS — Z23 Encounter for immunization: Secondary | ICD-10-CM | POA: Diagnosis not present

## 2016-04-16 DIAGNOSIS — N2581 Secondary hyperparathyroidism of renal origin: Secondary | ICD-10-CM | POA: Diagnosis not present

## 2016-04-16 DIAGNOSIS — N186 End stage renal disease: Secondary | ICD-10-CM | POA: Diagnosis not present

## 2016-04-16 DIAGNOSIS — Z23 Encounter for immunization: Secondary | ICD-10-CM | POA: Diagnosis not present

## 2016-04-18 DIAGNOSIS — N186 End stage renal disease: Secondary | ICD-10-CM | POA: Diagnosis not present

## 2016-04-18 DIAGNOSIS — Z23 Encounter for immunization: Secondary | ICD-10-CM | POA: Diagnosis not present

## 2016-04-18 DIAGNOSIS — N2581 Secondary hyperparathyroidism of renal origin: Secondary | ICD-10-CM | POA: Diagnosis not present

## 2016-04-21 DIAGNOSIS — Z23 Encounter for immunization: Secondary | ICD-10-CM | POA: Diagnosis not present

## 2016-04-21 DIAGNOSIS — N186 End stage renal disease: Secondary | ICD-10-CM | POA: Diagnosis not present

## 2016-04-21 DIAGNOSIS — N2581 Secondary hyperparathyroidism of renal origin: Secondary | ICD-10-CM | POA: Diagnosis not present

## 2016-04-23 DIAGNOSIS — Z23 Encounter for immunization: Secondary | ICD-10-CM | POA: Diagnosis not present

## 2016-04-23 DIAGNOSIS — N2581 Secondary hyperparathyroidism of renal origin: Secondary | ICD-10-CM | POA: Diagnosis not present

## 2016-04-23 DIAGNOSIS — N186 End stage renal disease: Secondary | ICD-10-CM | POA: Diagnosis not present

## 2016-04-25 DIAGNOSIS — N2581 Secondary hyperparathyroidism of renal origin: Secondary | ICD-10-CM | POA: Diagnosis not present

## 2016-04-25 DIAGNOSIS — N186 End stage renal disease: Secondary | ICD-10-CM | POA: Diagnosis not present

## 2016-04-25 DIAGNOSIS — Z23 Encounter for immunization: Secondary | ICD-10-CM | POA: Diagnosis not present

## 2016-04-28 DIAGNOSIS — N186 End stage renal disease: Secondary | ICD-10-CM | POA: Diagnosis not present

## 2016-04-28 DIAGNOSIS — Z992 Dependence on renal dialysis: Secondary | ICD-10-CM | POA: Diagnosis not present

## 2016-04-28 DIAGNOSIS — Q612 Polycystic kidney, adult type: Secondary | ICD-10-CM | POA: Diagnosis not present

## 2016-04-28 DIAGNOSIS — I4891 Unspecified atrial fibrillation: Secondary | ICD-10-CM | POA: Diagnosis not present

## 2016-04-28 DIAGNOSIS — Z23 Encounter for immunization: Secondary | ICD-10-CM | POA: Diagnosis not present

## 2016-04-28 DIAGNOSIS — N2581 Secondary hyperparathyroidism of renal origin: Secondary | ICD-10-CM | POA: Diagnosis not present

## 2016-04-28 LAB — PROTIME-INR: INR: 4.1 — AB (ref 0.9–1.1)

## 2016-04-29 ENCOUNTER — Ambulatory Visit (INDEPENDENT_AMBULATORY_CARE_PROVIDER_SITE_OTHER): Payer: Medicare Other | Admitting: Cardiology

## 2016-04-29 DIAGNOSIS — Z5181 Encounter for therapeutic drug level monitoring: Secondary | ICD-10-CM

## 2016-04-29 DIAGNOSIS — I4891 Unspecified atrial fibrillation: Secondary | ICD-10-CM

## 2016-04-30 DIAGNOSIS — N186 End stage renal disease: Secondary | ICD-10-CM | POA: Diagnosis not present

## 2016-04-30 DIAGNOSIS — N2581 Secondary hyperparathyroidism of renal origin: Secondary | ICD-10-CM | POA: Diagnosis not present

## 2016-05-05 DIAGNOSIS — N2581 Secondary hyperparathyroidism of renal origin: Secondary | ICD-10-CM | POA: Diagnosis not present

## 2016-05-05 DIAGNOSIS — N186 End stage renal disease: Secondary | ICD-10-CM | POA: Diagnosis not present

## 2016-05-05 DIAGNOSIS — I4891 Unspecified atrial fibrillation: Secondary | ICD-10-CM | POA: Diagnosis not present

## 2016-05-05 LAB — PROTIME-INR: INR: 2.1 — AB (ref 0.9–1.1)

## 2016-05-07 ENCOUNTER — Ambulatory Visit (INDEPENDENT_AMBULATORY_CARE_PROVIDER_SITE_OTHER): Payer: Medicare Other | Admitting: Cardiology

## 2016-05-07 DIAGNOSIS — Z5181 Encounter for therapeutic drug level monitoring: Secondary | ICD-10-CM

## 2016-05-07 DIAGNOSIS — N186 End stage renal disease: Secondary | ICD-10-CM | POA: Diagnosis not present

## 2016-05-07 DIAGNOSIS — N2581 Secondary hyperparathyroidism of renal origin: Secondary | ICD-10-CM | POA: Diagnosis not present

## 2016-05-07 DIAGNOSIS — I4891 Unspecified atrial fibrillation: Secondary | ICD-10-CM

## 2016-05-12 DIAGNOSIS — N186 End stage renal disease: Secondary | ICD-10-CM | POA: Diagnosis not present

## 2016-05-12 DIAGNOSIS — N2581 Secondary hyperparathyroidism of renal origin: Secondary | ICD-10-CM | POA: Diagnosis not present

## 2016-05-16 DIAGNOSIS — N186 End stage renal disease: Secondary | ICD-10-CM | POA: Diagnosis not present

## 2016-05-16 DIAGNOSIS — N2581 Secondary hyperparathyroidism of renal origin: Secondary | ICD-10-CM | POA: Diagnosis not present

## 2016-05-19 DIAGNOSIS — N186 End stage renal disease: Secondary | ICD-10-CM | POA: Diagnosis not present

## 2016-05-19 DIAGNOSIS — N2581 Secondary hyperparathyroidism of renal origin: Secondary | ICD-10-CM | POA: Diagnosis not present

## 2016-05-23 DIAGNOSIS — N186 End stage renal disease: Secondary | ICD-10-CM | POA: Diagnosis not present

## 2016-05-23 DIAGNOSIS — N2581 Secondary hyperparathyroidism of renal origin: Secondary | ICD-10-CM | POA: Diagnosis not present

## 2016-05-26 DIAGNOSIS — I4891 Unspecified atrial fibrillation: Secondary | ICD-10-CM | POA: Diagnosis not present

## 2016-05-26 DIAGNOSIS — N186 End stage renal disease: Secondary | ICD-10-CM | POA: Diagnosis not present

## 2016-05-26 DIAGNOSIS — N2581 Secondary hyperparathyroidism of renal origin: Secondary | ICD-10-CM | POA: Diagnosis not present

## 2016-05-28 ENCOUNTER — Ambulatory Visit (INDEPENDENT_AMBULATORY_CARE_PROVIDER_SITE_OTHER): Payer: Medicare Other | Admitting: Cardiovascular Disease

## 2016-05-28 DIAGNOSIS — N186 End stage renal disease: Secondary | ICD-10-CM | POA: Diagnosis not present

## 2016-05-28 DIAGNOSIS — Z992 Dependence on renal dialysis: Secondary | ICD-10-CM | POA: Diagnosis not present

## 2016-05-28 DIAGNOSIS — Q612 Polycystic kidney, adult type: Secondary | ICD-10-CM | POA: Diagnosis not present

## 2016-05-28 DIAGNOSIS — I4891 Unspecified atrial fibrillation: Secondary | ICD-10-CM

## 2016-05-28 DIAGNOSIS — Z5181 Encounter for therapeutic drug level monitoring: Secondary | ICD-10-CM

## 2016-05-28 DIAGNOSIS — N2581 Secondary hyperparathyroidism of renal origin: Secondary | ICD-10-CM | POA: Diagnosis not present

## 2016-05-28 LAB — PROTIME-INR: INR: 2.8 — AB (ref 0.9–1.1)

## 2016-06-02 DIAGNOSIS — N2581 Secondary hyperparathyroidism of renal origin: Secondary | ICD-10-CM | POA: Diagnosis not present

## 2016-06-02 DIAGNOSIS — N186 End stage renal disease: Secondary | ICD-10-CM | POA: Diagnosis not present

## 2016-06-04 DIAGNOSIS — N186 End stage renal disease: Secondary | ICD-10-CM | POA: Diagnosis not present

## 2016-06-04 DIAGNOSIS — N2581 Secondary hyperparathyroidism of renal origin: Secondary | ICD-10-CM | POA: Diagnosis not present

## 2016-06-09 DIAGNOSIS — N2581 Secondary hyperparathyroidism of renal origin: Secondary | ICD-10-CM | POA: Diagnosis not present

## 2016-06-09 DIAGNOSIS — N186 End stage renal disease: Secondary | ICD-10-CM | POA: Diagnosis not present

## 2016-06-11 DIAGNOSIS — N186 End stage renal disease: Secondary | ICD-10-CM | POA: Diagnosis not present

## 2016-06-11 DIAGNOSIS — N2581 Secondary hyperparathyroidism of renal origin: Secondary | ICD-10-CM | POA: Diagnosis not present

## 2016-06-16 DIAGNOSIS — N186 End stage renal disease: Secondary | ICD-10-CM | POA: Diagnosis not present

## 2016-06-16 DIAGNOSIS — N2581 Secondary hyperparathyroidism of renal origin: Secondary | ICD-10-CM | POA: Diagnosis not present

## 2016-06-20 DIAGNOSIS — N2581 Secondary hyperparathyroidism of renal origin: Secondary | ICD-10-CM | POA: Diagnosis not present

## 2016-06-20 DIAGNOSIS — N186 End stage renal disease: Secondary | ICD-10-CM | POA: Diagnosis not present

## 2016-06-25 DIAGNOSIS — N2581 Secondary hyperparathyroidism of renal origin: Secondary | ICD-10-CM | POA: Diagnosis not present

## 2016-06-25 DIAGNOSIS — N186 End stage renal disease: Secondary | ICD-10-CM | POA: Diagnosis not present

## 2016-06-25 DIAGNOSIS — I359 Nonrheumatic aortic valve disorder, unspecified: Secondary | ICD-10-CM | POA: Diagnosis not present

## 2016-06-25 LAB — PROTIME-INR: INR: 2.1 — AB (ref 0.9–1.1)

## 2016-06-27 DIAGNOSIS — N2581 Secondary hyperparathyroidism of renal origin: Secondary | ICD-10-CM | POA: Diagnosis not present

## 2016-06-27 DIAGNOSIS — N186 End stage renal disease: Secondary | ICD-10-CM | POA: Diagnosis not present

## 2016-06-28 DIAGNOSIS — N186 End stage renal disease: Secondary | ICD-10-CM | POA: Diagnosis not present

## 2016-06-28 DIAGNOSIS — Q612 Polycystic kidney, adult type: Secondary | ICD-10-CM | POA: Diagnosis not present

## 2016-06-28 DIAGNOSIS — Z992 Dependence on renal dialysis: Secondary | ICD-10-CM | POA: Diagnosis not present

## 2016-06-30 ENCOUNTER — Ambulatory Visit (INDEPENDENT_AMBULATORY_CARE_PROVIDER_SITE_OTHER): Payer: Medicare Other | Admitting: Cardiology

## 2016-06-30 DIAGNOSIS — Z5181 Encounter for therapeutic drug level monitoring: Secondary | ICD-10-CM

## 2016-06-30 DIAGNOSIS — Z23 Encounter for immunization: Secondary | ICD-10-CM | POA: Diagnosis not present

## 2016-06-30 DIAGNOSIS — D631 Anemia in chronic kidney disease: Secondary | ICD-10-CM | POA: Diagnosis not present

## 2016-06-30 DIAGNOSIS — N2581 Secondary hyperparathyroidism of renal origin: Secondary | ICD-10-CM | POA: Diagnosis not present

## 2016-06-30 DIAGNOSIS — I4891 Unspecified atrial fibrillation: Secondary | ICD-10-CM

## 2016-06-30 DIAGNOSIS — N186 End stage renal disease: Secondary | ICD-10-CM | POA: Diagnosis not present

## 2016-07-04 DIAGNOSIS — Z23 Encounter for immunization: Secondary | ICD-10-CM | POA: Diagnosis not present

## 2016-07-04 DIAGNOSIS — D631 Anemia in chronic kidney disease: Secondary | ICD-10-CM | POA: Diagnosis not present

## 2016-07-04 DIAGNOSIS — N2581 Secondary hyperparathyroidism of renal origin: Secondary | ICD-10-CM | POA: Diagnosis not present

## 2016-07-04 DIAGNOSIS — N186 End stage renal disease: Secondary | ICD-10-CM | POA: Diagnosis not present

## 2016-07-06 ENCOUNTER — Other Ambulatory Visit: Payer: Self-pay | Admitting: Cardiology

## 2016-07-08 ENCOUNTER — Other Ambulatory Visit: Payer: Self-pay | Admitting: Cardiology

## 2016-07-08 MED ORDER — DIGOXIN 125 MCG PO TABS: ORAL_TABLET | ORAL | 0 refills | Status: DC

## 2016-07-09 DIAGNOSIS — N186 End stage renal disease: Secondary | ICD-10-CM | POA: Diagnosis not present

## 2016-07-09 DIAGNOSIS — D631 Anemia in chronic kidney disease: Secondary | ICD-10-CM | POA: Diagnosis not present

## 2016-07-09 DIAGNOSIS — Z23 Encounter for immunization: Secondary | ICD-10-CM | POA: Diagnosis not present

## 2016-07-09 DIAGNOSIS — N2581 Secondary hyperparathyroidism of renal origin: Secondary | ICD-10-CM | POA: Diagnosis not present

## 2016-07-11 DIAGNOSIS — N2581 Secondary hyperparathyroidism of renal origin: Secondary | ICD-10-CM | POA: Diagnosis not present

## 2016-07-11 DIAGNOSIS — Z23 Encounter for immunization: Secondary | ICD-10-CM | POA: Diagnosis not present

## 2016-07-11 DIAGNOSIS — D631 Anemia in chronic kidney disease: Secondary | ICD-10-CM | POA: Diagnosis not present

## 2016-07-11 DIAGNOSIS — N186 End stage renal disease: Secondary | ICD-10-CM | POA: Diagnosis not present

## 2016-07-14 DIAGNOSIS — N186 End stage renal disease: Secondary | ICD-10-CM | POA: Diagnosis not present

## 2016-07-14 DIAGNOSIS — Z23 Encounter for immunization: Secondary | ICD-10-CM | POA: Diagnosis not present

## 2016-07-14 DIAGNOSIS — N2581 Secondary hyperparathyroidism of renal origin: Secondary | ICD-10-CM | POA: Diagnosis not present

## 2016-07-14 DIAGNOSIS — D631 Anemia in chronic kidney disease: Secondary | ICD-10-CM | POA: Diagnosis not present

## 2016-07-18 DIAGNOSIS — N186 End stage renal disease: Secondary | ICD-10-CM | POA: Diagnosis not present

## 2016-07-18 DIAGNOSIS — D631 Anemia in chronic kidney disease: Secondary | ICD-10-CM | POA: Diagnosis not present

## 2016-07-18 DIAGNOSIS — Z23 Encounter for immunization: Secondary | ICD-10-CM | POA: Diagnosis not present

## 2016-07-18 DIAGNOSIS — N2581 Secondary hyperparathyroidism of renal origin: Secondary | ICD-10-CM | POA: Diagnosis not present

## 2016-07-23 DIAGNOSIS — I359 Nonrheumatic aortic valve disorder, unspecified: Secondary | ICD-10-CM | POA: Diagnosis not present

## 2016-07-23 DIAGNOSIS — D631 Anemia in chronic kidney disease: Secondary | ICD-10-CM | POA: Diagnosis not present

## 2016-07-23 DIAGNOSIS — N186 End stage renal disease: Secondary | ICD-10-CM | POA: Diagnosis not present

## 2016-07-23 DIAGNOSIS — N2581 Secondary hyperparathyroidism of renal origin: Secondary | ICD-10-CM | POA: Diagnosis not present

## 2016-07-23 DIAGNOSIS — Z23 Encounter for immunization: Secondary | ICD-10-CM | POA: Diagnosis not present

## 2016-07-23 LAB — PROTIME-INR: INR: 2 — AB (ref 0.9–1.1)

## 2016-07-24 ENCOUNTER — Ambulatory Visit (INDEPENDENT_AMBULATORY_CARE_PROVIDER_SITE_OTHER): Payer: Medicare Other | Admitting: Internal Medicine

## 2016-07-24 DIAGNOSIS — Z5181 Encounter for therapeutic drug level monitoring: Secondary | ICD-10-CM

## 2016-07-24 DIAGNOSIS — I4891 Unspecified atrial fibrillation: Secondary | ICD-10-CM

## 2016-07-25 DIAGNOSIS — N186 End stage renal disease: Secondary | ICD-10-CM | POA: Diagnosis not present

## 2016-07-25 DIAGNOSIS — D631 Anemia in chronic kidney disease: Secondary | ICD-10-CM | POA: Diagnosis not present

## 2016-07-25 DIAGNOSIS — Z23 Encounter for immunization: Secondary | ICD-10-CM | POA: Diagnosis not present

## 2016-07-25 DIAGNOSIS — N2581 Secondary hyperparathyroidism of renal origin: Secondary | ICD-10-CM | POA: Diagnosis not present

## 2016-07-29 DIAGNOSIS — Q612 Polycystic kidney, adult type: Secondary | ICD-10-CM | POA: Diagnosis not present

## 2016-07-29 DIAGNOSIS — N186 End stage renal disease: Secondary | ICD-10-CM | POA: Diagnosis not present

## 2016-07-29 DIAGNOSIS — Z992 Dependence on renal dialysis: Secondary | ICD-10-CM | POA: Diagnosis not present

## 2016-07-30 DIAGNOSIS — N2581 Secondary hyperparathyroidism of renal origin: Secondary | ICD-10-CM | POA: Diagnosis not present

## 2016-07-30 DIAGNOSIS — N186 End stage renal disease: Secondary | ICD-10-CM | POA: Diagnosis not present

## 2016-08-04 DIAGNOSIS — N186 End stage renal disease: Secondary | ICD-10-CM | POA: Diagnosis not present

## 2016-08-04 DIAGNOSIS — N2581 Secondary hyperparathyroidism of renal origin: Secondary | ICD-10-CM | POA: Diagnosis not present

## 2016-08-06 DIAGNOSIS — N186 End stage renal disease: Secondary | ICD-10-CM | POA: Diagnosis not present

## 2016-08-06 DIAGNOSIS — N2581 Secondary hyperparathyroidism of renal origin: Secondary | ICD-10-CM | POA: Diagnosis not present

## 2016-08-11 DIAGNOSIS — N2581 Secondary hyperparathyroidism of renal origin: Secondary | ICD-10-CM | POA: Diagnosis not present

## 2016-08-11 DIAGNOSIS — N186 End stage renal disease: Secondary | ICD-10-CM | POA: Diagnosis not present

## 2016-08-15 DIAGNOSIS — N2581 Secondary hyperparathyroidism of renal origin: Secondary | ICD-10-CM | POA: Diagnosis not present

## 2016-08-15 DIAGNOSIS — N186 End stage renal disease: Secondary | ICD-10-CM | POA: Diagnosis not present

## 2016-08-20 DIAGNOSIS — N2581 Secondary hyperparathyroidism of renal origin: Secondary | ICD-10-CM | POA: Diagnosis not present

## 2016-08-20 DIAGNOSIS — I359 Nonrheumatic aortic valve disorder, unspecified: Secondary | ICD-10-CM | POA: Diagnosis not present

## 2016-08-20 DIAGNOSIS — N186 End stage renal disease: Secondary | ICD-10-CM | POA: Diagnosis not present

## 2016-08-20 LAB — PROTIME-INR: INR: 1.9 — AB (ref 0.9–1.1)

## 2016-08-21 ENCOUNTER — Ambulatory Visit (INDEPENDENT_AMBULATORY_CARE_PROVIDER_SITE_OTHER): Payer: Medicare Other | Admitting: Internal Medicine

## 2016-08-21 DIAGNOSIS — Z5181 Encounter for therapeutic drug level monitoring: Secondary | ICD-10-CM

## 2016-08-21 DIAGNOSIS — I4891 Unspecified atrial fibrillation: Secondary | ICD-10-CM

## 2016-08-22 DIAGNOSIS — N186 End stage renal disease: Secondary | ICD-10-CM | POA: Diagnosis not present

## 2016-08-22 DIAGNOSIS — N2581 Secondary hyperparathyroidism of renal origin: Secondary | ICD-10-CM | POA: Diagnosis not present

## 2016-08-26 DIAGNOSIS — Z992 Dependence on renal dialysis: Secondary | ICD-10-CM | POA: Diagnosis not present

## 2016-08-26 DIAGNOSIS — Q612 Polycystic kidney, adult type: Secondary | ICD-10-CM | POA: Diagnosis not present

## 2016-08-26 DIAGNOSIS — N186 End stage renal disease: Secondary | ICD-10-CM | POA: Diagnosis not present

## 2016-08-27 DIAGNOSIS — N2581 Secondary hyperparathyroidism of renal origin: Secondary | ICD-10-CM | POA: Diagnosis not present

## 2016-08-27 DIAGNOSIS — N186 End stage renal disease: Secondary | ICD-10-CM | POA: Diagnosis not present

## 2016-09-01 DIAGNOSIS — N2581 Secondary hyperparathyroidism of renal origin: Secondary | ICD-10-CM | POA: Diagnosis not present

## 2016-09-01 DIAGNOSIS — N186 End stage renal disease: Secondary | ICD-10-CM | POA: Diagnosis not present

## 2016-09-05 DIAGNOSIS — N2581 Secondary hyperparathyroidism of renal origin: Secondary | ICD-10-CM | POA: Diagnosis not present

## 2016-09-05 DIAGNOSIS — N186 End stage renal disease: Secondary | ICD-10-CM | POA: Diagnosis not present

## 2016-09-05 DIAGNOSIS — I4891 Unspecified atrial fibrillation: Secondary | ICD-10-CM | POA: Diagnosis not present

## 2016-09-05 LAB — PROTIME-INR: INR: 2 — AB (ref 0.9–1.1)

## 2016-09-08 ENCOUNTER — Ambulatory Visit (INDEPENDENT_AMBULATORY_CARE_PROVIDER_SITE_OTHER): Payer: Medicare Other

## 2016-09-08 DIAGNOSIS — Z5181 Encounter for therapeutic drug level monitoring: Secondary | ICD-10-CM

## 2016-09-08 DIAGNOSIS — I4891 Unspecified atrial fibrillation: Secondary | ICD-10-CM

## 2016-09-10 DIAGNOSIS — N186 End stage renal disease: Secondary | ICD-10-CM | POA: Diagnosis not present

## 2016-09-10 DIAGNOSIS — N2581 Secondary hyperparathyroidism of renal origin: Secondary | ICD-10-CM | POA: Diagnosis not present

## 2016-09-15 DIAGNOSIS — N2581 Secondary hyperparathyroidism of renal origin: Secondary | ICD-10-CM | POA: Diagnosis not present

## 2016-09-15 DIAGNOSIS — N186 End stage renal disease: Secondary | ICD-10-CM | POA: Diagnosis not present

## 2016-09-19 DIAGNOSIS — N186 End stage renal disease: Secondary | ICD-10-CM | POA: Diagnosis not present

## 2016-09-19 DIAGNOSIS — N2581 Secondary hyperparathyroidism of renal origin: Secondary | ICD-10-CM | POA: Diagnosis not present

## 2016-09-24 DIAGNOSIS — N186 End stage renal disease: Secondary | ICD-10-CM | POA: Diagnosis not present

## 2016-09-24 DIAGNOSIS — N2581 Secondary hyperparathyroidism of renal origin: Secondary | ICD-10-CM | POA: Diagnosis not present

## 2016-09-26 DIAGNOSIS — N186 End stage renal disease: Secondary | ICD-10-CM | POA: Diagnosis not present

## 2016-09-26 DIAGNOSIS — Q612 Polycystic kidney, adult type: Secondary | ICD-10-CM | POA: Diagnosis not present

## 2016-09-26 DIAGNOSIS — Z992 Dependence on renal dialysis: Secondary | ICD-10-CM | POA: Diagnosis not present

## 2016-09-29 DIAGNOSIS — N2581 Secondary hyperparathyroidism of renal origin: Secondary | ICD-10-CM | POA: Diagnosis not present

## 2016-09-29 DIAGNOSIS — D631 Anemia in chronic kidney disease: Secondary | ICD-10-CM | POA: Diagnosis not present

## 2016-09-29 DIAGNOSIS — N186 End stage renal disease: Secondary | ICD-10-CM | POA: Diagnosis not present

## 2016-10-01 DIAGNOSIS — D631 Anemia in chronic kidney disease: Secondary | ICD-10-CM | POA: Diagnosis not present

## 2016-10-01 DIAGNOSIS — N186 End stage renal disease: Secondary | ICD-10-CM | POA: Diagnosis not present

## 2016-10-01 DIAGNOSIS — N2581 Secondary hyperparathyroidism of renal origin: Secondary | ICD-10-CM | POA: Diagnosis not present

## 2016-10-06 DIAGNOSIS — D631 Anemia in chronic kidney disease: Secondary | ICD-10-CM | POA: Diagnosis not present

## 2016-10-06 DIAGNOSIS — N2581 Secondary hyperparathyroidism of renal origin: Secondary | ICD-10-CM | POA: Diagnosis not present

## 2016-10-06 DIAGNOSIS — N186 End stage renal disease: Secondary | ICD-10-CM | POA: Diagnosis not present

## 2016-10-08 DIAGNOSIS — N186 End stage renal disease: Secondary | ICD-10-CM | POA: Diagnosis not present

## 2016-10-08 DIAGNOSIS — I4891 Unspecified atrial fibrillation: Secondary | ICD-10-CM | POA: Diagnosis not present

## 2016-10-08 DIAGNOSIS — D631 Anemia in chronic kidney disease: Secondary | ICD-10-CM | POA: Diagnosis not present

## 2016-10-08 DIAGNOSIS — N2581 Secondary hyperparathyroidism of renal origin: Secondary | ICD-10-CM | POA: Diagnosis not present

## 2016-10-08 LAB — PROTIME-INR: INR: 2.1 — AB (ref 0.9–1.1)

## 2016-10-10 ENCOUNTER — Other Ambulatory Visit: Payer: Self-pay | Admitting: Cardiology

## 2016-10-12 ENCOUNTER — Ambulatory Visit (INDEPENDENT_AMBULATORY_CARE_PROVIDER_SITE_OTHER): Payer: Medicare Other | Admitting: Cardiovascular Disease

## 2016-10-12 DIAGNOSIS — I4891 Unspecified atrial fibrillation: Secondary | ICD-10-CM

## 2016-10-12 DIAGNOSIS — Z5181 Encounter for therapeutic drug level monitoring: Secondary | ICD-10-CM

## 2016-10-12 NOTE — Telephone Encounter (Signed)
Rx(s) sent to pharmacy electronically.  

## 2016-10-13 DIAGNOSIS — D631 Anemia in chronic kidney disease: Secondary | ICD-10-CM | POA: Diagnosis not present

## 2016-10-13 DIAGNOSIS — N2581 Secondary hyperparathyroidism of renal origin: Secondary | ICD-10-CM | POA: Diagnosis not present

## 2016-10-13 DIAGNOSIS — N186 End stage renal disease: Secondary | ICD-10-CM | POA: Diagnosis not present

## 2016-10-20 DIAGNOSIS — D631 Anemia in chronic kidney disease: Secondary | ICD-10-CM | POA: Diagnosis not present

## 2016-10-20 DIAGNOSIS — N2581 Secondary hyperparathyroidism of renal origin: Secondary | ICD-10-CM | POA: Diagnosis not present

## 2016-10-20 DIAGNOSIS — N186 End stage renal disease: Secondary | ICD-10-CM | POA: Diagnosis not present

## 2016-10-22 ENCOUNTER — Other Ambulatory Visit: Payer: Self-pay | Admitting: Cardiology

## 2016-10-24 DIAGNOSIS — N186 End stage renal disease: Secondary | ICD-10-CM | POA: Diagnosis not present

## 2016-10-24 DIAGNOSIS — D631 Anemia in chronic kidney disease: Secondary | ICD-10-CM | POA: Diagnosis not present

## 2016-10-24 DIAGNOSIS — N2581 Secondary hyperparathyroidism of renal origin: Secondary | ICD-10-CM | POA: Diagnosis not present

## 2016-10-26 DIAGNOSIS — Z992 Dependence on renal dialysis: Secondary | ICD-10-CM | POA: Diagnosis not present

## 2016-10-26 DIAGNOSIS — N186 End stage renal disease: Secondary | ICD-10-CM | POA: Diagnosis not present

## 2016-10-26 DIAGNOSIS — Q612 Polycystic kidney, adult type: Secondary | ICD-10-CM | POA: Diagnosis not present

## 2016-10-27 DIAGNOSIS — N186 End stage renal disease: Secondary | ICD-10-CM | POA: Diagnosis not present

## 2016-10-27 DIAGNOSIS — N2581 Secondary hyperparathyroidism of renal origin: Secondary | ICD-10-CM | POA: Diagnosis not present

## 2016-11-03 DIAGNOSIS — N186 End stage renal disease: Secondary | ICD-10-CM | POA: Diagnosis not present

## 2016-11-03 DIAGNOSIS — N2581 Secondary hyperparathyroidism of renal origin: Secondary | ICD-10-CM | POA: Diagnosis not present

## 2016-11-03 DIAGNOSIS — I359 Nonrheumatic aortic valve disorder, unspecified: Secondary | ICD-10-CM | POA: Diagnosis not present

## 2016-11-03 LAB — PROTIME-INR: INR: 1.9 — AB (ref 0.9–1.1)

## 2016-11-04 ENCOUNTER — Ambulatory Visit (INDEPENDENT_AMBULATORY_CARE_PROVIDER_SITE_OTHER): Payer: Medicare Other | Admitting: Interventional Cardiology

## 2016-11-04 DIAGNOSIS — I4891 Unspecified atrial fibrillation: Secondary | ICD-10-CM

## 2016-11-04 DIAGNOSIS — Z5181 Encounter for therapeutic drug level monitoring: Secondary | ICD-10-CM

## 2016-11-05 DIAGNOSIS — N186 End stage renal disease: Secondary | ICD-10-CM | POA: Diagnosis not present

## 2016-11-05 DIAGNOSIS — N2581 Secondary hyperparathyroidism of renal origin: Secondary | ICD-10-CM | POA: Diagnosis not present

## 2016-11-10 ENCOUNTER — Other Ambulatory Visit: Payer: Self-pay

## 2016-11-10 ENCOUNTER — Emergency Department (HOSPITAL_COMMUNITY): Payer: Medicare Other

## 2016-11-10 ENCOUNTER — Encounter (HOSPITAL_COMMUNITY): Payer: Self-pay | Admitting: Emergency Medicine

## 2016-11-10 ENCOUNTER — Emergency Department (HOSPITAL_COMMUNITY)
Admission: EM | Admit: 2016-11-10 | Discharge: 2016-11-10 | Disposition: A | Discharge status: Home / Self Care | Payer: Medicare Other | Attending: Emergency Medicine | Admitting: Emergency Medicine

## 2016-11-10 DIAGNOSIS — I120 Hypertensive chronic kidney disease with stage 5 chronic kidney disease or end stage renal disease: Secondary | ICD-10-CM | POA: Diagnosis not present

## 2016-11-10 DIAGNOSIS — Z992 Dependence on renal dialysis: Secondary | ICD-10-CM | POA: Insufficient documentation

## 2016-11-10 DIAGNOSIS — Z96651 Presence of right artificial knee joint: Secondary | ICD-10-CM | POA: Insufficient documentation

## 2016-11-10 DIAGNOSIS — J9811 Atelectasis: Secondary | ICD-10-CM | POA: Diagnosis not present

## 2016-11-10 DIAGNOSIS — N186 End stage renal disease: Secondary | ICD-10-CM | POA: Diagnosis not present

## 2016-11-10 DIAGNOSIS — R531 Weakness: Secondary | ICD-10-CM | POA: Insufficient documentation

## 2016-11-10 DIAGNOSIS — N179 Acute kidney failure, unspecified: Secondary | ICD-10-CM | POA: Diagnosis not present

## 2016-11-10 DIAGNOSIS — Z7901 Long term (current) use of anticoagulants: Secondary | ICD-10-CM | POA: Diagnosis not present

## 2016-11-10 DIAGNOSIS — E10649 Type 1 diabetes mellitus with hypoglycemia without coma: Secondary | ICD-10-CM | POA: Diagnosis not present

## 2016-11-10 HISTORY — DX: Patient's noncompliance with renal dialysis for other reason: Z91.158

## 2016-11-10 HISTORY — DX: Patient's noncompliance with renal dialysis: Z91.15

## 2016-11-10 LAB — BASIC METABOLIC PANEL
ANION GAP: 15 (ref 5–15)
BUN: 52 mg/dL — ABNORMAL HIGH (ref 6–20)
CALCIUM: 8.6 mg/dL — AB (ref 8.9–10.3)
CO2: 25 mmol/L (ref 22–32)
Chloride: 96 mmol/L — ABNORMAL LOW (ref 101–111)
Creatinine, Ser: 10.28 mg/dL — ABNORMAL HIGH (ref 0.44–1.00)
GFR, EST AFRICAN AMERICAN: 4 mL/min — AB (ref >60)
GFR, EST NON AFRICAN AMERICAN: 4 mL/min — AB (ref >60)
GLUCOSE: 83 mg/dL (ref 65–99)
POTASSIUM: 4.6 mmol/L (ref 3.5–5.1)
Sodium: 136 mmol/L (ref 135–145)

## 2016-11-10 LAB — CBC
HEMATOCRIT: 53.4 % — AB (ref 36.0–46.0)
Hemoglobin: 16 g/dL — ABNORMAL HIGH (ref 12.0–15.0)
MCH: 26.4 pg (ref 26.0–34.0)
MCHC: 30 g/dL (ref 30.0–36.0)
MCV: 88.1 fL (ref 78.0–100.0)
Platelets: 99 10*3/uL — ABNORMAL LOW (ref 150–400)
RBC: 6.06 MIL/uL — AB (ref 3.87–5.11)
RDW: 18.4 % — AB (ref 11.5–15.5)
WBC: 7 10*3/uL (ref 4.0–10.5)

## 2016-11-10 LAB — I-STAT TROPONIN, ED: Troponin i, poc: 0.02 ng/mL (ref 0.00–0.08)

## 2016-11-10 MED ORDER — SODIUM CHLORIDE 0.9 % IV BOLUS (SEPSIS)
250.0000 mL | Freq: Once | INTRAVENOUS | Status: AC
Start: 2016-11-10 — End: 2016-11-10
  Administered 2016-11-10: 250 mL via INTRAVENOUS

## 2016-11-10 MED ORDER — SODIUM CHLORIDE 0.9 % IV BOLUS (SEPSIS)
250.0000 mL | Freq: Once | INTRAVENOUS | Status: AC
  Administered 2016-11-10: 250 mL via INTRAVENOUS

## 2016-11-10 NOTE — ED Notes (Signed)
BP taken on lower R forearm d/t grafts/fistulas. Patient reports SBP typically in 70's and 80's.

## 2016-11-10 NOTE — ED Notes (Signed)
Dr. Linker at bedside at this time.  

## 2016-11-10 NOTE — ED Provider Notes (Signed)
[facility]-EMERGENCY DEPT Provider Note   CSN: [id number] Arrival date & time: 11/10/16  1131     History   Chief Complaint Chief Complaint  Patient presents with  . Weakness    HPI [name] Bender is a 61 y.o. female.  HPI  Pt with hx of ESRD on dialysis presents with c/o generalized weakness and depression causing her to miss dialysis.  She states she has missed several dialysis sessions this month- most recently last session she attended was Thursday, missed Saturday and today- because she felt too weak to drive herself.  She denies feelilng short of breath.  She states she is depressed due to the loss of her husband and son last year.  She denies fever/chills.  Denies chest pain.  No syncope.  She states that her blood pressure usually runs in the low 70s and that is normal for her.  There are no other associated systemic symptoms, there are no other alleviating or modifying factors.   Past Medical History:  Diagnosis Date  . Atrial fibrillation (HCC)   . Dialysis patient, noncompliant (HCC)   . HTN (hypertension)   . Hypercholesterolemia   . Polycystic kidney disease   . Supraclinoid carotid artery aneurysm, small     Patient Active Problem List   Diagnosis Date Noted  . Cellulitis of right lower extremity 11/17/2015  . Hyperparathyroidism, secondary renal (HCC) 11/17/2015  . Hyperphosphatemia 11/17/2015  . ESRD (end stage renal disease) (HCC) 11/04/2015  . Cellulitis of right lower leg 11/03/2015  . Encounter for therapeutic drug monitoring 10/11/2014  . ESRD 05/29/2009  . HYPERCHOLESTEROLEMIA 05/28/2009  . Obesity, morbid (HCC) 05/28/2009  . Essential hypertension 05/28/2009  . ATRIAL FIBRILLATION 05/28/2009  . POLYCYSTIC KIDNEY DISEASE 05/28/2009    Past Surgical History:  Procedure Laterality Date  . APPENDECTOMY    . c-sections     x2  . DG AV DIALYSIS  SHUNT ACCESS EXIST*L* OR    . RIGHT OOPHORECTOMY    . rivght ovary removal     secondary to a cyst  .  THROMBECTOMY     of right upper arm ateriovenous graft with revision   . TOTAL KNEE ARTHROPLASTY  feb 2005   right knee    OB History    No data available       Home Medications    Prior to Admission medications   Medication Sig Start Date End Date Taking? Authorizing Provider  b complex-vitamin c-folic acid (NEPHRO-VITE) 0.8 MG TABS tablet Take 1 tablet by mouth at bedtime.   Yes [provider]  cinacalcet (SENSIPAR) 30 MG tablet Take 30 mg by mouth daily.     Yes [provider]  digoxin (LANOXIN) 0.125 MG tablet TAKE 1 TABLET BY MOUTH TWO TIMES A WEEK ON TUESDAY & FRIDAY 10/12/16  Yes Lewayne Bunting, MD  fenofibrate (TRICOR) 145 MG tablet Take 145 mg by mouth daily.     Yes [provider]  metoprolol (LOPRESSOR) 100 MG tablet Take 1 tablet (100 mg total) by mouth daily. 03/30/16  Yes Lewayne Bunting, MD  sevelamer (RENVELA) 800 MG tablet Take 2,400 mg by mouth 3 (three) times daily before meals.    Yes [provider]  warfarin (COUMADIN) 5 MG tablet TAKE 1/2 TO 1 TABLET BY MOUTH DAILY AS DIRECTED BY COUMADIN CLINIC 10/22/16  Yes Lewayne Bunting, MD  metoprolol (LOPRESSOR) 100 MG tablet Take 1 tablet (100 mg total) by mouth daily. Patient not taking: Reported on 11/10/2016  03/30/16   Lewayne Bunting, MD  sertraline (ZOLOFT) 50 MG tablet Take 50 mg by mouth daily.    [provider]    Family History Family History  Problem Relation Age of Onset  . Atrial fibrillation Father   . Stroke Mother     Social History Social History  Substance Use Topics  . Smoking status: Never Smoker  . Smokeless tobacco: Never Used  . Alcohol use Yes     Comment: occasional     Allergies   Ancef [cefazolin sodium] and Ancef [cefazolin]   Review of Systems Review of Systems  ROS reviewed and all otherwise negative except for mentioned in HPI   Physical Exam Updated Vital Signs BP (!) 81/60   Pulse 84   Temp 97.8 F (36.6 C)  (Oral)   Resp 17   Wt 89.8 kg   SpO2 98%   BMI 35.07 kg/m  Vitals reviewed Physical Exam Physical Examination: General appearance - alert, chronically ill appearing, and in no distress Mental status - alert, oriented to person, place, and time Eyes - no conjunctival injection, no scleral icterus Mouth - mucous membranes moist, pharynx normal without lesions Neck - supple, no significant adenopathy Chest - clear to auscultation, no wheezes, rales or rhonchi, symmetric air entry Heart - normal rate, regular rhythm, normal S1, S2, no murmurs, rubs, clicks or gallops Abdomen - soft, nontender, nondistended, no masses or organomegaly Neurological - alert, oriented, normal speech, no focal findings or movement disorder noted Extremities - peripheral pulses normal, no pedal edema, no clubbing or cyanosis. Venous stasis changes in bilateral lower extremities Skin - normal coloration and turgor, no rashes  ED Treatments / Results  Labs (all labs ordered are listed, but only abnormal results are displayed) Labs Reviewed  CBC - Abnormal; Notable for the following:       Result Value   RBC 6.06 (*)    Hemoglobin 16.0 (*)    HCT 53.4 (*)    RDW 18.4 (*)    Platelets 99 (*)    All other components within normal limits  BASIC METABOLIC PANEL - Abnormal; Notable for the following:    Chloride 96 (*)    BUN 52 (*)    Creatinine, Ser 10.28 (*)    Calcium 8.6 (*)    GFR calc non Af Amer 4 (*)    GFR calc Af Amer 4 (*)    All other components within normal limits  I-STAT TROPOININ, ED    EKG  EKG Interpretation  Date/Time:  Tuesday Nov 10 2016 11:54:32 EDT Ventricular Rate:  97 PR Interval:    QRS Duration: 93 QT Interval:  303 QTC Calculation: 385 R Axis:   -103 Text Interpretation:  Atrial fibrillation Left anterior fascicular block Low voltage, precordial leads Probable right ventricular hypertrophy Probable lateral infarct, age indeterminate Since previous tracing Left anterior  fasicular block is new Confirmed by Karma Ganja  MD, Winnifred Dufford [phone]) on 11/10/2016 1:14:42 PM     This patients CHA2DS2-VASc Score and unadjusted Ischemic Stroke Rate (% per year) is equal to 0.6 % stroke rate/year from a score of 1  Above score calculated as 1 point each if present [CHF, HTN, DM, Vascular=MI/PAD/Aortic Plaque, Age if 65-74, or Female] Above score calculated as 2 points each if present [Age > 75, or Stroke/TIA/TE]   Radiology Dg Chest 2 View  Result Date: 11/10/2016 CLINICAL DATA:  Weakness.  Missed dialysis. EXAM: CHEST  2 VIEW COMPARISON:  05/16/2010 FINDINGS: Cardiomegaly accentuated  by low lung volumes. Aortic atherosclerosis and vascular pedicle widening. Mild atelectasis seen in lateral projection as anterior streaky densities. There is no edema, air bronchogram, effusion, or pneumothorax. Exaggerated thoracic kyphosis. Limited visualization of vertebral bodies due to soft tissue attenuation. IMPRESSION: 1. Low lung volumes and mild atelectasis. Negative for edema or definitive pneumonia. 2. Cardiomegaly. Electronically Signed   By: Marnee SpringJonathon  Watts M.D.   On: 11/10/2016 13:13    Procedures Procedures (including critical care time)  Medications Ordered in ED Medications  sodium chloride 0.9 % bolus 250 mL (0 mLs Intravenous Stopped 11/10/16 1512)  sodium chloride 0.9 % bolus 250 mL (0 mLs Intravenous Stopped 11/10/16 1556)     Initial Impression / Assessment and Plan / ED Course  I have reviewed the triage vital signs and the nursing notes.  Pertinent labs & imaging results that were available during my care of the patient were reviewed by me and considered in my medical decision making (see chart for details).    3:16 PM pt states her baseline blood pressure is systolic in the low 70s, I have talked with her about this multiple times and she states she feels fine and that this is routine blood pressure for her.  I have reviewed all her labs, she states that potassium of  4.6 is very good for her.  She is pleased with the rest of her labs.  Her CXR is reassuring as well.  She is chronically in afib- HR is in the 90s.  Her O2 sat is not picking up well but when it is picking up her O2 sat is between 97-99%.  I have encouraged her not to miss dialysis sessions.  She is asking for referrals for someone to prescribe her an antidepressant.  She denies HI and SI.  I will give her referrals for outpatient resources at discharge.     Final Clinical Impressions(s) / ED Diagnoses   Final diagnoses:  Weakness  End stage renal failure on dialysis [facility](HCC)    New Prescriptions Discharge Medication List as of 11/10/2016  3:22 PM       Jerelyn ScottLinker, Delainy Mcelhiney, MD 11/11/16 [phone]

## 2016-11-10 NOTE — ED Triage Notes (Signed)
[place] states has missed dialysis since may 10 due to depression  And going thru some issuesshe states  Has several grafts and ones in her arms are clotted off , states she has graft in rt leg now

## 2016-11-10 NOTE — Discharge Instructions (Signed)
Return to the ED with any concerns including chest pain, difficulty breathing, vomiting and not able to keep down liquids, fever/chills, fainting, decreased level of alertness/lethargy, or any other alarming symptoms, thoughts or feelings of homicide or suicide, or any other alarming symptoms

## 2016-11-12 DIAGNOSIS — N2581 Secondary hyperparathyroidism of renal origin: Secondary | ICD-10-CM | POA: Diagnosis not present

## 2016-11-12 DIAGNOSIS — N186 End stage renal disease: Secondary | ICD-10-CM | POA: Diagnosis not present

## 2016-11-14 ENCOUNTER — Inpatient Hospital Stay (HOSPITAL_COMMUNITY)
Admission: EM | Admit: 2016-11-14 | Discharge: 2016-11-27 | DRG: 314 | Disposition: E | Payer: Medicare Other | Attending: Internal Medicine | Admitting: Internal Medicine

## 2016-11-14 ENCOUNTER — Encounter (HOSPITAL_COMMUNITY): Payer: Self-pay

## 2016-11-14 ENCOUNTER — Inpatient Hospital Stay (HOSPITAL_COMMUNITY): Payer: Medicare Other

## 2016-11-14 ENCOUNTER — Emergency Department (HOSPITAL_COMMUNITY): Payer: Medicare Other

## 2016-11-14 DIAGNOSIS — I482 Chronic atrial fibrillation: Secondary | ICD-10-CM | POA: Diagnosis present

## 2016-11-14 DIAGNOSIS — Z992 Dependence on renal dialysis: Secondary | ICD-10-CM | POA: Diagnosis not present

## 2016-11-14 DIAGNOSIS — Z7189 Other specified counseling: Secondary | ICD-10-CM | POA: Diagnosis not present

## 2016-11-14 DIAGNOSIS — I480 Paroxysmal atrial fibrillation: Secondary | ICD-10-CM | POA: Diagnosis not present

## 2016-11-14 DIAGNOSIS — E860 Dehydration: Secondary | ICD-10-CM | POA: Diagnosis present

## 2016-11-14 DIAGNOSIS — Z9115 Patient's noncompliance with renal dialysis: Secondary | ICD-10-CM

## 2016-11-14 DIAGNOSIS — Z515 Encounter for palliative care: Secondary | ICD-10-CM | POA: Diagnosis not present

## 2016-11-14 DIAGNOSIS — G9340 Encephalopathy, unspecified: Secondary | ICD-10-CM | POA: Diagnosis present

## 2016-11-14 DIAGNOSIS — Z79899 Other long term (current) drug therapy: Secondary | ICD-10-CM

## 2016-11-14 DIAGNOSIS — E7800 Pure hypercholesterolemia, unspecified: Secondary | ICD-10-CM | POA: Diagnosis not present

## 2016-11-14 DIAGNOSIS — E785 Hyperlipidemia, unspecified: Secondary | ICD-10-CM | POA: Diagnosis present

## 2016-11-14 DIAGNOSIS — J90 Pleural effusion, not elsewhere classified: Secondary | ICD-10-CM | POA: Diagnosis present

## 2016-11-14 DIAGNOSIS — R55 Syncope and collapse: Secondary | ICD-10-CM | POA: Diagnosis present

## 2016-11-14 DIAGNOSIS — Z7901 Long term (current) use of anticoagulants: Secondary | ICD-10-CM

## 2016-11-14 DIAGNOSIS — J9601 Acute respiratory failure with hypoxia: Secondary | ICD-10-CM | POA: Diagnosis not present

## 2016-11-14 DIAGNOSIS — N186 End stage renal disease: Secondary | ICD-10-CM | POA: Diagnosis not present

## 2016-11-14 DIAGNOSIS — G930 Cerebral cysts: Secondary | ICD-10-CM | POA: Diagnosis present

## 2016-11-14 DIAGNOSIS — E662 Morbid (severe) obesity with alveolar hypoventilation: Secondary | ICD-10-CM | POA: Diagnosis present

## 2016-11-14 DIAGNOSIS — R0902 Hypoxemia: Secondary | ICD-10-CM

## 2016-11-14 DIAGNOSIS — Z66 Do not resuscitate: Secondary | ICD-10-CM | POA: Diagnosis present

## 2016-11-14 DIAGNOSIS — Z6841 Body Mass Index (BMI) 40.0 and over, adult: Secondary | ICD-10-CM | POA: Diagnosis not present

## 2016-11-14 DIAGNOSIS — I10 Essential (primary) hypertension: Secondary | ICD-10-CM | POA: Diagnosis not present

## 2016-11-14 DIAGNOSIS — J9602 Acute respiratory failure with hypercapnia: Secondary | ICD-10-CM | POA: Diagnosis not present

## 2016-11-14 DIAGNOSIS — N2581 Secondary hyperparathyroidism of renal origin: Secondary | ICD-10-CM | POA: Diagnosis present

## 2016-11-14 DIAGNOSIS — E875 Hyperkalemia: Secondary | ICD-10-CM | POA: Diagnosis present

## 2016-11-14 DIAGNOSIS — Q613 Polycystic kidney, unspecified: Secondary | ICD-10-CM | POA: Diagnosis not present

## 2016-11-14 DIAGNOSIS — Z7401 Bed confinement status: Secondary | ICD-10-CM

## 2016-11-14 DIAGNOSIS — F329 Major depressive disorder, single episode, unspecified: Secondary | ICD-10-CM | POA: Diagnosis present

## 2016-11-14 DIAGNOSIS — R069 Unspecified abnormalities of breathing: Secondary | ICD-10-CM | POA: Diagnosis not present

## 2016-11-14 DIAGNOSIS — E872 Acidosis: Secondary | ICD-10-CM | POA: Diagnosis present

## 2016-11-14 DIAGNOSIS — R0602 Shortness of breath: Secondary | ICD-10-CM | POA: Diagnosis not present

## 2016-11-14 DIAGNOSIS — R4189 Other symptoms and signs involving cognitive functions and awareness: Secondary | ICD-10-CM | POA: Diagnosis present

## 2016-11-14 DIAGNOSIS — I890 Lymphedema, not elsewhere classified: Secondary | ICD-10-CM | POA: Diagnosis present

## 2016-11-14 DIAGNOSIS — I959 Hypotension, unspecified: Principal | ICD-10-CM | POA: Diagnosis present

## 2016-11-14 DIAGNOSIS — D631 Anemia in chronic kidney disease: Secondary | ICD-10-CM | POA: Diagnosis not present

## 2016-11-14 DIAGNOSIS — F32A Depression, unspecified: Secondary | ICD-10-CM | POA: Diagnosis present

## 2016-11-14 DIAGNOSIS — E43 Unspecified severe protein-calorie malnutrition: Secondary | ICD-10-CM | POA: Diagnosis present

## 2016-11-14 DIAGNOSIS — Z9119 Patient's noncompliance with other medical treatment and regimen: Secondary | ICD-10-CM

## 2016-11-14 DIAGNOSIS — R091 Pleurisy: Secondary | ICD-10-CM | POA: Diagnosis not present

## 2016-11-14 DIAGNOSIS — I120 Hypertensive chronic kidney disease with stage 5 chronic kidney disease or end stage renal disease: Secondary | ICD-10-CM | POA: Diagnosis present

## 2016-11-14 DIAGNOSIS — D696 Thrombocytopenia, unspecified: Secondary | ICD-10-CM | POA: Diagnosis present

## 2016-11-14 DIAGNOSIS — E7801 Familial hypercholesterolemia: Secondary | ICD-10-CM | POA: Diagnosis not present

## 2016-11-14 DIAGNOSIS — Z90721 Acquired absence of ovaries, unilateral: Secondary | ICD-10-CM

## 2016-11-14 DIAGNOSIS — D649 Anemia, unspecified: Secondary | ICD-10-CM | POA: Diagnosis present

## 2016-11-14 DIAGNOSIS — G919 Hydrocephalus, unspecified: Secondary | ICD-10-CM | POA: Diagnosis present

## 2016-11-14 DIAGNOSIS — B351 Tinea unguium: Secondary | ICD-10-CM | POA: Diagnosis present

## 2016-11-14 DIAGNOSIS — J9811 Atelectasis: Secondary | ICD-10-CM | POA: Diagnosis present

## 2016-11-14 DIAGNOSIS — Z888 Allergy status to other drugs, medicaments and biological substances status: Secondary | ICD-10-CM

## 2016-11-14 DIAGNOSIS — Z96651 Presence of right artificial knee joint: Secondary | ICD-10-CM | POA: Diagnosis present

## 2016-11-14 DIAGNOSIS — R627 Adult failure to thrive: Secondary | ICD-10-CM | POA: Diagnosis present

## 2016-11-14 DIAGNOSIS — I509 Heart failure, unspecified: Secondary | ICD-10-CM | POA: Diagnosis not present

## 2016-11-14 DIAGNOSIS — I4891 Unspecified atrial fibrillation: Secondary | ICD-10-CM | POA: Diagnosis present

## 2016-11-14 LAB — CBC
HCT: 61.3 % — ABNORMAL HIGH (ref 36.0–46.0)
HEMOGLOBIN: 17.6 g/dL — AB (ref 12.0–15.0)
MCH: 25.8 pg — AB (ref 26.0–34.0)
MCHC: 28.7 g/dL — AB (ref 30.0–36.0)
MCV: 90 fL (ref 78.0–100.0)
Platelets: 66 10*3/uL — ABNORMAL LOW (ref 150–400)
RBC: 6.81 MIL/uL — AB (ref 3.87–5.11)
RDW: 19.1 % — ABNORMAL HIGH (ref 11.5–15.5)
WBC: 9.1 10*3/uL (ref 4.0–10.5)

## 2016-11-14 LAB — BASIC METABOLIC PANEL
ANION GAP: 16 — AB (ref 5–15)
BUN: 17 mg/dL (ref 6–20)
CHLORIDE: 97 mmol/L — AB (ref 101–111)
CO2: 26 mmol/L (ref 22–32)
Calcium: 8.4 mg/dL — ABNORMAL LOW (ref 8.9–10.3)
Creatinine, Ser: 5.99 mg/dL — ABNORMAL HIGH (ref 0.44–1.00)
GFR calc non Af Amer: 7 mL/min — ABNORMAL LOW (ref >60)
GFR, EST AFRICAN AMERICAN: 8 mL/min — AB (ref >60)
GLUCOSE: 95 mg/dL (ref 65–99)
POTASSIUM: 4.1 mmol/L (ref 3.5–5.1)
Sodium: 139 mmol/L (ref 135–145)

## 2016-11-14 LAB — SAVE SMEAR

## 2016-11-14 LAB — PROTIME-INR
INR: 2.33
PROTHROMBIN TIME: 26 s — AB (ref 11.4–15.2)

## 2016-11-14 LAB — I-STAT TROPONIN, ED: Troponin i, poc: 0.01 ng/mL (ref 0.00–0.08)

## 2016-11-14 LAB — DIGOXIN LEVEL: Digoxin Level: 0.3 ng/mL — ABNORMAL LOW (ref 0.8–2.0)

## 2016-11-14 MED ORDER — SODIUM CHLORIDE 0.9 % IV BOLUS (SEPSIS): 1000.0000 mL | Freq: Once | INTRAVENOUS | Status: DC

## 2016-11-14 MED ORDER — ONDANSETRON HCL 4 MG/2ML IJ SOLN: 4.0000 mg | Freq: Four times a day (QID) | INTRAMUSCULAR | Status: DC | PRN

## 2016-11-14 MED ORDER — ACETAMINOPHEN 650 MG RE SUPP: 650.0000 mg | Freq: Four times a day (QID) | RECTAL | Status: DC | PRN

## 2016-11-14 MED ORDER — DIGOXIN 125 MCG PO TABS: 0.1250 mg | ORAL_TABLET | ORAL | Status: DC

## 2016-11-14 MED ORDER — SEVELAMER CARBONATE 800 MG PO TABS
2400.0000 mg | ORAL_TABLET | Freq: Three times a day (TID) | ORAL | Status: DC
  Administered 2016-11-15 (×3): 2400 mg via ORAL
  Filled 2016-11-14 (×4): qty 3

## 2016-11-14 MED ORDER — FENOFIBRATE 160 MG PO TABS
160.0000 mg | ORAL_TABLET | Freq: Every day | ORAL | Status: DC
  Administered 2016-11-15: 160 mg via ORAL
  Filled 2016-11-14 (×2): qty 1

## 2016-11-14 MED ORDER — WARFARIN - PHARMACIST DOSING INPATIENT: Freq: Every day | Status: DC

## 2016-11-14 MED ORDER — SODIUM CHLORIDE 0.9 % IV BOLUS (SEPSIS)
250.0000 mL | Freq: Once | INTRAVENOUS | Status: AC
  Administered 2016-11-14: 250 mL via INTRAVENOUS

## 2016-11-14 MED ORDER — ACETAMINOPHEN 325 MG PO TABS: 650.0000 mg | ORAL_TABLET | Freq: Four times a day (QID) | ORAL | Status: DC | PRN

## 2016-11-14 MED ORDER — RENA-VITE PO TABS
1.0000 | ORAL_TABLET | Freq: Every day | ORAL | Status: DC
  Administered 2016-11-15: 1 via ORAL
  Filled 2016-11-14 (×2): qty 1

## 2016-11-14 MED ORDER — METOPROLOL TARTRATE 100 MG PO TABS: 100.0000 mg | ORAL_TABLET | Freq: Every day | ORAL | Status: DC

## 2016-11-14 MED ORDER — WARFARIN SODIUM 5 MG PO TABS
5.0000 mg | ORAL_TABLET | Freq: Once | ORAL | Status: AC
  Administered 2016-11-15: 5 mg via ORAL
  Filled 2016-11-14 (×2): qty 1

## 2016-11-14 MED ORDER — SERTRALINE HCL 50 MG PO TABS
50.0000 mg | ORAL_TABLET | Freq: Every day | ORAL | Status: DC
  Administered 2016-11-15: 50 mg via ORAL
  Filled 2016-11-14 (×3): qty 1

## 2016-11-14 MED ORDER — ONDANSETRON HCL 4 MG PO TABS: 4.0000 mg | ORAL_TABLET | Freq: Four times a day (QID) | ORAL | Status: DC | PRN

## 2016-11-14 MED ORDER — HYDROCORTISONE NA SUCCINATE PF 100 MG IJ SOLR
50.0000 mg | Freq: Once | INTRAMUSCULAR | Status: AC
  Administered 2016-11-15: 50 mg via INTRAVENOUS
  Filled 2016-11-14: qty 2

## 2016-11-14 MED ORDER — ALBUTEROL SULFATE (2.5 MG/3ML) 0.083% IN NEBU: 2.5000 mg | INHALATION_SOLUTION | RESPIRATORY_TRACT | Status: DC | PRN

## 2016-11-14 MED ORDER — CINACALCET HCL 30 MG PO TABS
30.0000 mg | ORAL_TABLET | Freq: Every day | ORAL | Status: DC
  Administered 2016-11-15: 30 mg via ORAL
  Filled 2016-11-14 (×2): qty 1

## 2016-11-14 MED ORDER — SODIUM CHLORIDE 0.9% FLUSH
3.0000 mL | Freq: Two times a day (BID) | INTRAVENOUS | Status: DC
  Administered 2016-11-15 – 2016-11-16 (×4): 3 mL via INTRAVENOUS

## 2016-11-14 MED ORDER — SODIUM CHLORIDE 0.9 % IV SOLN
INTRAVENOUS | Status: DC
  Administered 2016-11-14: 22:00:00 via INTRAVENOUS

## 2016-11-14 NOTE — H&P (Addendum)
History and Physical    [name] Bender UJW:[id number] DOB: 05/16/1956 DOA: 12-06-2016  Referring MD/NP/PA:   PCP: Primitivo Gauze, MD   Patient coming from:  The patient is coming from home.  At baseline, pt is independent for most of ADL.   Chief Complaint: unresponsiveness and SOB  HPI: [name] Bender is a 61 y.o. female with medical history significant of ESRD-HD (TTS), PCK, hypertension, hyperlipidemia, diet-controlled diabetes, depression, atrial fibrillation on Coumadin, Spraclinoid carotid artery aneurysm, thrombocytopenia, who presents with and responsiveness and shortness of breath.  Per report, pt became unresponsive when she was doing dialysis is [facility]. She was in 2 hr 50 min out 4 hour treatment. 1 kg weight was taken off per report. Staff reported to EMS that pt went unresponsive for 2 min. She had SOB. EMS unable to get BP or palpate BP. Her Bp was 53/38 at arrival to ED which improved to 75/53 after 250 NS bolus. Of note, Chart review showed that she has low bp at 80s. When I saw pt in ED, she is drowsy, but oriented x 3. Chronically ill looking. She answered all questions appropriately. She denies shortness of breath, chest pain, cough, fever or chills to me. No nausea, vomiting, diarrhea, abdominal pain, symptoms of UTI. No unilateral weakness, numbness or tingling in his extremities. No facial droop or slurred speech. She has bilateral lower leg edema. Her SBP is 60-80s currently.  ED Course: pt was found to have INR 2.33, WBC 9.1, hemoglobin 17.6, platelet of 66, potassium 4.1, bicarbonate 26, creatinine 5.99, BUN 17, temperature 97.2, heart rate 70 to 90s, O2 89 to 90 on room air, which improved to 94% on 3 L oxygen. Chest x-ray showed moderate left pleural effusion without infiltration. Patient is admitted to stepdown as inpatient.  Review of Systems:   General: no fevers, chills, has poor appetite, has fatigue HEENT: no blurry vision, hearing changes or  sore throat Respiratory: had dyspnea, no coughing, wheezing CV: no chest pain, no palpitations GI: no nausea, vomiting, abdominal pain, diarrhea, constipation GU: no dysuria, burning on urination, increased urinary frequency, hematuria  Ext: has leg edema Neuro: no unilateral weakness, numbness, or tingling, no vision change or hearing loss. Had syncope. Skin: no rash, no skin tear. MSK: No muscle spasm, no deformity, no limitation of range of movement in spin Heme: No easy bruising.  Travel history: No recent long distant travel.  Allergy:  Allergies  Allergen Reactions  . Ancef [Cefazolin Sodium] Itching  . Ancef [Cefazolin]     Past Medical History:  Diagnosis Date  . Atrial fibrillation (HCC)   . Dialysis patient, noncompliant (HCC)   . HTN (hypertension)   . Hypercholesterolemia   . Polycystic kidney disease   . Supraclinoid carotid artery aneurysm, small     Past Surgical History:  Procedure Laterality Date  . APPENDECTOMY    . c-sections     x2  . DG AV DIALYSIS  SHUNT ACCESS EXIST*L* OR    . RIGHT OOPHORECTOMY    . rivght ovary removal     secondary to a cyst  . THROMBECTOMY     of right upper arm ateriovenous graft with revision   . TOTAL KNEE ARTHROPLASTY  feb 2005   right knee    Social History:  reports that she has never smoked. She has never used smokeless tobacco. She reports that she drinks alcohol. She reports that she does not use drugs.  Family History:  Family History  Problem Relation Age of Onset  . Atrial fibrillation Father   . Stroke Mother      Prior to Admission medications   Medication Sig Start Date End Date Taking? Authorizing Provider  b complex-vitamin c-folic acid (NEPHRO-VITE) 0.8 MG TABS tablet Take 1 tablet by mouth at bedtime.    [provider]  cinacalcet (SENSIPAR) 30 MG tablet Take 30 mg by mouth daily.      [provider]  digoxin (LANOXIN) 0.125 MG tablet TAKE 1 TABLET BY MOUTH TWO TIMES A WEEK ON  TUESDAY & FRIDAY 10/12/16   Lewayne Bunting, MD  fenofibrate (TRICOR) 145 MG tablet Take 145 mg by mouth daily.      [provider]  metoprolol (LOPRESSOR) 100 MG tablet Take 1 tablet (100 mg total) by mouth daily. Patient not taking: Reported on 11/10/2016 03/30/16   Lewayne Bunting, MD  metoprolol (LOPRESSOR) 100 MG tablet Take 1 tablet (100 mg total) by mouth daily. 03/30/16   Lewayne Bunting, MD  sertraline (ZOLOFT) 50 MG tablet Take 50 mg by mouth daily.    [provider]  sevelamer (RENVELA) 800 MG tablet Take 2,400 mg by mouth 3 (three) times daily before meals.     [provider]  warfarin (COUMADIN) 5 MG tablet TAKE 1/2 TO 1 TABLET BY MOUTH DAILY AS DIRECTED BY COUMADIN CLINIC 10/22/16   Lewayne Bunting, MD    Physical Exam: Vitals:   11/10/2016 2030 11/09/2016 2112 11/20/2016 2124 11/23/2016 2213  BP: (!) 73/52 (!) 131/96 (!) 64/39 95/67  Pulse: (!) 49 (!) 40 62 (!) 107  Resp: (!) 26 (!) 38 (!) 35 (!) 28  Temp:      TempSrc:      SpO2: 99% 98% 100% 100%   General: Not in acute distress HEENT:       Eyes: PERRL, EOMI, no scleral icterus.       ENT: No discharge from the ears and nose, no pharynx injection, no tonsillar enlargement.        Neck: Difficult to assess JVD due to morbid obesity, no bruit, no mass felt. Heme: No neck lymph node enlargement. Cardiac: S1/S2, RRR, No murmurs, No gallops or rubs. Respiratory: No rales, wheezing, rhonchi or rubs. GI: Soft, nondistended, nontender, no rebound pain, no organomegaly, BS present. GU: No hematuria Ext: 2+ pitting leg edema bilaterally. 1+DP/PT pulse bilaterally. Right lower leg is mildly erythematous, but no warmth or tenderness, does not seem to have infection. Musculoskeletal: No joint deformities, No joint redness or warmth, no limitation of ROM in spin. Skin: No rashes.  Neuro: Drowsy, but oriented X3, cranial nerves II-XII grossly intact, moves all extremities normally. Muscle strength 5/5 in  all extremities, sensation to light Bender intact. Brachial reflex 2+ bilaterally. Negative Babinski's sign. Psych: Patient is not psychotic, no suicidal or hemocidal ideation.  Labs on Admission: I have personally reviewed following labs and imaging studies  CBC:  Recent Labs Lab 11/10/16 1410 11/21/2016 1703  WBC 7.0 9.1  HGB 16.0* 17.6*  HCT 53.4* 61.3*  MCV 88.1 90.0  PLT 99* 66*   Basic Metabolic Panel:  Recent Labs Lab 11/10/16 1410 11/08/2016 1806  NA 136 139  K 4.6 4.1  CL 96* 97*  CO2 25 26  GLUCOSE 83 95  BUN 52* 17  CREATININE 10.28* 5.99*  CALCIUM 8.6* 8.4*   GFR: CrCl cannot be calculated (Unknown ideal weight.). Liver Function Tests: No results for input(s): AST, ALT, ALKPHOS, BILITOT, PROT,  ALBUMIN in the last 168 hours. No results for input(s): LIPASE, AMYLASE in the last 168 hours. No results for input(s): AMMONIA in the last 168 hours. Coagulation Profile:  Recent Labs Lab 07/08/16 1703  INR 2.33   Cardiac Enzymes: No results for input(s): CKTOTAL, CKMB, CKMBINDEX, TROPONINI in the last 168 hours. BNP (last 3 results) No results for input(s): PROBNP in the last 8760 hours. HbA1C: No results for input(s): HGBA1C in the last 72 hours. CBG: No results for input(s): GLUCAP in the last 168 hours. Lipid Profile: No results for input(s): CHOL, HDL, LDLCALC, TRIG, CHOLHDL, LDLDIRECT in the last 72 hours. Thyroid Function Tests: No results for input(s): TSH, T4TOTAL, FREET4, T3FREE, THYROIDAB in the last 72 hours. Anemia Panel: No results for input(s): VITAMINB12, FOLATE, FERRITIN, TIBC, IRON, RETICCTPCT in the last 72 hours. Urine analysis: No results found for: COLORURINE, APPEARANCEUR, LABSPEC, PHURINE, GLUCOSEU, HGBUR, BILIRUBINUR, KETONESUR, PROTEINUR, UROBILINOGEN, NITRITE, LEUKOCYTESUR Sepsis Labs: @LABRCNTIP (procalcitonin:4,lacticidven:4) )No results found for this or any previous visit (from the past 240 hour(s)).   Radiological Exams on  Admission: Dg Chest Port 1 View  Result Date: February 10, 2017 CLINICAL DATA:  Shortness of breath during dialysis therapy EXAM: PORTABLE CHEST 1 VIEW COMPARISON:  11/10/2016 FINDINGS: Cardiac shadow is again enlarged. Aortic calcifications are again seen. Right lung is well aerated without focal infiltrate. Enlarging left-sided pleural effusion and likely underlying atelectasis is seen. No bony abnormality is noted. IMPRESSION: Enlarging left basilar pleural effusion and likely atelectasis. Electronically Signed   By: Alcide CleverMark  Lukens M.D.   On: 0August 15, 2018 18:51     EKG: Independently reviewed.  Atrial fibrillation, QTC 465, low voltage, nonspecific T-wave change, and unstable baseline.   Assessment/Plan Principal Problem:   Syncope Active Problems:   Essential hypertension   ATRIAL FIBRILLATION   Unresponsiveness   ESRD on dialysis (HCC)   Depression   Acute respiratory failure with hypoxia (HCC)   Hypotension   HLD (hyperlipidemia)   Thrombocytopenia (HCC)   Syncope: Etiology is not clear. No source of infection identified. Chest x-ray has no infiltration, but showed left pleural effusion. Pending UA. Pt is hypotensive which is likely the reason. Pt is on Coumadin with therapeutic INR, less likely to have PE. Patient does not have focal neurologic findings on physical examination, less likely to have stroke, but still need to rule out intracranial bleeding since patient is on Coumadin.   -Will admit to SDU as inpt -will give another 250 NS bolus and followed by 50 cc/h -check Bp q1h -neuro check q2h -CT-head -2d echo -trop x 3 -check cortisol level -f/u blood and urine culture, UA -pt/ot  Hypotension: Etiology is not clear. Simple explanation is due to fluid removal in dialysis.  Patient does not have source of infection identified. Right lower leg is mildly erythematous, but no warmth or tenderness, does not seem to have infection. Will need to r/o adrenal insufficiency. -will give  another 250 NS bolus and followed by 50 cc/h -will give once dose of solucortef, 50 mg x 1 -check cortisol level -f/u blood and urine culture -prn NS bolus, each time 250 cc  HLD: -on Fenofibrate  HTN: now has hypotension -pt is on metoprolol which is also for A fib, will put holding SBP parameter (hold if SBP<100)  Atrial Fibrillation: CHA2DS2-VASc Score is 3, needs oral anticoagulation. Patient is on Coumadin at home. INR is 2.33 on admission. Heart rate is well controlled. Pt has thrombocytopenia with a platelet 66, but no bleeding tendency. -continue coumadin per  pharm -continue digoxin (digoin level is 0.3) -on metoprolol. Put holding SBP parameter (hold if SBP<110)  Depression: no suicidal or homicidal ideations. -Continue home medications: zoloft  ESRD on dialysis:  -continue Renvela, sensipar -left message to renal box for dialysis  Acute respiratory failure with hypoxia [facility]): Etiology is not clear. May be due to combination of the left pleural effusion and and responsiveness. Patient does not seem to have PNA clinically. No leukocytosis or fever. Patient is on Coumadin with therapeutic INR, less likely to have PE. Currently O2 100% on 3 L nasal cannula oxygen -continue nasal cannula oxygen -May consider thoracentesis if has persistent oxygen saturation  Thrombocytopenia: This seems to be a chronic issue, but worsened today. Etiology is not clear. No bleeding tendency. -Follow-up by CBC -check peripheral smear   DVT ppx: on Coumadin Code Status: Full code Family Communication: None at bed side. Disposition Plan:  Anticipate discharge back to previous home environment Consults called:  SDU Admission status: SDU/inpation       Date of Service 2016-11-26    Lorretta Harp [facility] Pager [phone]  If 7PM-7AM, please contact night-coverage [email or web address] Password [facility] 26-Nov-2016, 10:23 PM

## 2016-11-14 NOTE — ED Triage Notes (Signed)
To hallway bed via EMS.  Pt was at [facility], was 2 hr 50 min in of 4 hour treatment and started c/o shortness of breath.  1 kg weight taken off.   Staff reported to EMS that pt went unresponsive for 2 min.  EMS unable to get BP or palpate BP.  Pt c/o no pain.  Dialysis access to right thigh.

## 2016-11-14 NOTE — ED Notes (Signed)
To CT

## 2016-11-14 NOTE — ED Notes (Signed)
Report attempted 

## 2016-11-14 NOTE — ED Notes (Signed)
Dr. Clyde LundborgNiu at bedside, BP responding to IV fluids, appropriate for stepdown.

## 2016-11-14 NOTE — Progress Notes (Signed)
ANTICOAGULATION CONSULT NOTE - Initial Consult  Pharmacy Consult for coumadin Indication: atrial fibrillation  Allergies  Allergen Reactions  . Ancef [Cefazolin Sodium] Itching  . Ancef [Cefazolin]     Vital Signs: Temp: 97.2 F (36.2 C) (05/19 1545) Temp Source: Oral (05/19 1545) BP: 64/39 (05/19 2124) Pulse Rate: 62 (05/19 2124)  Labs:  Recent Labs  2016/09/13 1703 2016/09/13 1806  HGB 17.6*  --   HCT 61.3*  --   PLT 66*  --   LABPROT 26.0*  --   INR 2.33  --   CREATININE  --  5.99*    CrCl cannot be calculated (Unknown ideal weight.).   Medical History: Past Medical History:  Diagnosis Date  . Atrial fibrillation (HCC)   . Dialysis patient, noncompliant (HCC)   . HTN (hypertension)   . Hypercholesterolemia   . Polycystic kidney disease   . Supraclinoid carotid artery aneurysm, small     Assessment: 61 yo female here with SOB. He is on coumadin PTA for afib (noted with ESRD) and pharmacy consulted to dose -INR= 2.33  Home coumadin dose: 2.5mg /day except take 5mg  WeSa (last dose on 5/18)  Goal of Therapy:  INR 2-3 Monitor platelets by anticoagulation protocol: Yes   Plan:  -Coumadin 5mg  po today -Daily PT/INR  Harland GermanAndrew Ival Basquez, Pharm D 2016/07/06 9:57 PM

## 2016-11-14 NOTE — ED Notes (Signed)
Pt does not make any urine 

## 2016-11-14 NOTE — ED Notes (Signed)
No addl blood draw,  Pt enroute to inpatient. 

## 2016-11-14 NOTE — ED Notes (Signed)
IV team at bedside 

## 2016-11-14 NOTE — ED Provider Notes (Signed)
[facility]-EMERGENCY DEPT Provider Note   CSN: [id number] Arrival date & time: Dec 08, 2016  1532     History   Chief Complaint Chief Complaint  Patient presents with  . Shortness of Breath    HPI [name] Bender is a 61 y.o. female.  HPI Brought from dialysis for period of unresponsiveness. She had gone through 2 hours and 50 minutes of a 4 hour treatment. [facility] reports that she complain of shortness of breath and staff reported to EMS that she had a two-minute unresponsive episode. Patient reports that she was not having symptoms of increased shortness of breath. She reports that she chronically has irregular heart rate which nothing can be done about, she reports she chronically has low blood pressure and they are always harassing her about her vital signs despite the fact that it is always the same. She denies she experience chest pain. The patient lives in a chronically debilitated state. She has a lift chair at home and transfers from a lift chair to a wheelchair to go places. She does not independently ambulate in her home. She reports she has been having severe problems with depression since both her husband and her son died a year ago. She reports she did try antidepressant medications for while although it did make her nervous given all of her other medical problems and medications.  Past Medical History:  Diagnosis Date  . Atrial fibrillation (HCC)   . Dialysis patient, noncompliant (HCC)   . HTN (hypertension)   . Hypercholesterolemia   . Polycystic kidney disease   . Supraclinoid carotid artery aneurysm, small     Patient Active Problem List   Diagnosis Date Noted  . Unresponsiveness 12/08/16  . ESRD on dialysis (HCC) 08-Dec-2016  . Depression 08-Dec-2016  . Acute respiratory failure with hypoxia (HCC) December 08, 2016  . Syncope 12/08/2016  . Hypotension 12-08-16  . Cellulitis of right lower extremity 11/17/2015  . Hyperparathyroidism, secondary renal (HCC) 11/17/2015    . Hyperphosphatemia 11/17/2015  . ESRD (end stage renal disease) (HCC) 11/04/2015  . Cellulitis of right lower leg 11/03/2015  . Encounter for therapeutic drug monitoring 10/11/2014  . ESRD 05/29/2009  . HYPERCHOLESTEROLEMIA 05/28/2009  . Obesity, morbid (HCC) 05/28/2009  . Essential hypertension 05/28/2009  . ATRIAL FIBRILLATION 05/28/2009  . POLYCYSTIC KIDNEY DISEASE 05/28/2009    Past Surgical History:  Procedure Laterality Date  . APPENDECTOMY    . c-sections     x2  . DG AV DIALYSIS  SHUNT ACCESS EXIST*L* OR    . RIGHT OOPHORECTOMY    . rivght ovary removal     secondary to a cyst  . THROMBECTOMY     of right upper arm ateriovenous graft with revision   . TOTAL KNEE ARTHROPLASTY  feb 2005   right knee    OB History    No data available       Home Medications    Prior to Admission medications   Medication Sig Start Date End Date Taking? Authorizing Provider  b complex-vitamin c-folic acid (NEPHRO-VITE) 0.8 MG TABS tablet Take 1 tablet by mouth at bedtime.    [provider]  cinacalcet (SENSIPAR) 30 MG tablet Take 30 mg by mouth daily.      [provider]  digoxin (LANOXIN) 0.125 MG tablet TAKE 1 TABLET BY MOUTH TWO TIMES A WEEK ON TUESDAY & FRIDAY 10/12/16   Lewayne Bunting, MD  fenofibrate (TRICOR) 145 MG tablet Take 145 mg by mouth daily.  [provider]  metoprolol (LOPRESSOR) 100 MG tablet Take 1 tablet (100 mg total) by mouth daily. Patient not taking: Reported on 11/10/2016 03/30/16   Lewayne Buntingrenshaw, Brian S, MD  metoprolol (LOPRESSOR) 100 MG tablet Take 1 tablet (100 mg total) by mouth daily. 03/30/16   Lewayne Buntingrenshaw, Brian S, MD  sertraline (ZOLOFT) 50 MG tablet Take 50 mg by mouth daily.    [provider]  sevelamer (RENVELA) 800 MG tablet Take 2,400 mg by mouth 3 (three) times daily before meals.     [provider]  warfarin (COUMADIN) 5 MG tablet TAKE 1/2 TO 1 TABLET BY MOUTH DAILY AS DIRECTED BY COUMADIN CLINIC  10/22/16   Lewayne Buntingrenshaw, Brian S, MD    Family History Family History  Problem Relation Age of Onset  . Atrial fibrillation Father   . Stroke Mother     Social History Social History  Substance Use Topics  . Smoking status: Never Smoker  . Smokeless tobacco: Never Used  . Alcohol use Yes     Comment: occasional     Allergies   Ancef [cefazolin sodium] and Ancef [cefazolin]   Review of Systems Review of Systems 10 Systems reviewed and are negative for acute change except as noted in the HPI.   Physical Exam Updated Vital Signs BP (!) 64/39 (BP Location: Right Arm) Comment (BP Location): lower arm  Pulse 62   Temp 97.2 F (36.2 C) (Oral)   Resp (!) 35   SpO2 100%   Physical Exam  Constitutional: She is oriented to person, place, and time.  Patient is extremely deconditioned. She has somnolent appearance but cognitively she is normal with normal recall and speech patterns. Tachypnea without distress.  HENT:  Mouth/Throat: Oropharynx is clear and moist.  Cardiovascular:  Irregularly irregular no gross rub murmur gallop  Pulmonary/Chest:  Tachypnea but not appearing distressed. Breath sounds bilaterally. Due to patient positioning which is a significant forward flexion of the upper body and protuberant abdomen pushed up to the chest, patient has very little breath sounds toward the bases.  Abdominal: Soft.  Patient has a soft very distended protuberance of her abdomen to the left side. Nontender  Musculoskeletal:  2+ pitting edema bilateral lower extremities. Mild erythema and skin scaling on the right anterior lower leg. Skin scaling of left without erythema.  Neurological: She is alert and oriented to person, place, and time.  Patient has general weakness. She does follow commands. Her cognitive function is normal. She has normal recall for historical events. She has intact reasoning.  Skin:  Skin is doughy, dry and hyperpigmented.  Psychiatric:  Flat affect.      ED Treatments / Results  Labs (all labs ordered are listed, but only abnormal results are displayed) Labs Reviewed  CBC - Abnormal; Notable for the following:       Result Value   RBC 6.81 (*)    Hemoglobin 17.6 (*)    HCT 61.3 (*)    MCH 25.8 (*)    MCHC 28.7 (*)    RDW 19.1 (*)    Platelets 66 (*)    All other components within normal limits  DIGOXIN LEVEL - Abnormal; Notable for the following:    Digoxin Level 0.3 (*)    All other components within normal limits  PROTIME-INR - Abnormal; Notable for the following:    Prothrombin Time 26.0 (*)    All other components within normal limits  BASIC METABOLIC PANEL - Abnormal; Notable for the following:  Chloride 97 (*)    Creatinine, Ser 5.99 (*)    Calcium 8.4 (*)    GFR calc non Af Amer 7 (*)    GFR calc Af Amer 8 (*)    Anion gap 16 (*)    All other components within normal limits  CULTURE, BLOOD (ROUTINE X 2)  CULTURE, BLOOD (ROUTINE X 2)  SAVE SMEAR  PATHOLOGIST SMEAR REVIEW  HIV ANTIBODY (ROUTINE TESTING)  BASIC METABOLIC PANEL  CBC  CORTISOL-AM, BLOOD  TROPONIN I  TROPONIN I  TROPONIN I  RAPID URINE DRUG SCREEN, HOSP PERFORMED  I-STAT TROPOININ, ED  I-STAT CHEM 8, ED  CBG MONITORING, ED    EKG  EKG Interpretation  Date/Time:  Saturday Nov 14 2016 15:44:23 EDT Ventricular Rate:  101 PR Interval:    QRS Duration: 97 QT Interval:  362 QTC Calculation: 465 R Axis:   176 Text Interpretation:  Atrial fibrillation Low voltage, extremity and precordial leads Probable right ventricular hypertrophy Probable lateral infarct, age indeterminate since last tracing no significant change Confirmed by BELFI  MD, MELANIE [phone]) on 11/21/2016 3:51:37 PM       Radiology Dg Chest Port 1 View  Result Date: 11/06/2016 CLINICAL DATA:  Shortness of breath during dialysis therapy EXAM: PORTABLE CHEST 1 VIEW COMPARISON:  11/10/2016 FINDINGS: Cardiac shadow is again enlarged. Aortic calcifications are again seen.  Right lung is well aerated without focal infiltrate. Enlarging left-sided pleural effusion and likely underlying atelectasis is seen. No bony abnormality is noted. IMPRESSION: Enlarging left basilar pleural effusion and likely atelectasis. Electronically Signed   By: Alcide Clever M.D.   On: 11/11/2016 18:51    Procedures Procedures (including critical care time) Angiocath insertion Performed by: Arby Barrette  Consent: Verbal consent obtained. Risks and benefits: risks, benefits and alternatives were discussed Time out: Immediately prior to procedure a "time out" was called to verify the correct patient, procedure, equipment, support staff and site/side marked as required.  Preparation: Patient was prepped and draped in the usual sterile fashion.  Vein Location: left AC  Ultrasound Guided  Gauge:20  Normal blood return and flush without difficulty Patient tolerance: Patient tolerated the procedure well with no immediate complications.  CRITICAL CARE Performed by: Arby Barrette   Total critical care time:30 minutes  Critical care time was exclusive of separately billable procedures and treating other patients.  Critical care was necessary to treat or prevent imminent or life-threatening deterioration.  Critical care was time spent personally by me on the following activities: development of treatment plan with patient and/or surrogate as well as nursing, discussions with consultants, evaluation of patient's response to treatment, examination of patient, obtaining history from patient or surrogate, ordering and performing treatments and interventions, ordering and review of laboratory studies, ordering and review of radiographic studies, pulse oximetry and re-evaluation of patient's condition.  Medications Ordered in ED Medications  sodium chloride 0.9 % bolus 250 mL (not administered)  digoxin (LANOXIN) tablet 0.125 mg (not administered)  metoprolol tartrate (LOPRESSOR)  tablet 100 mg (not administered)  multivitamin (RENA-VIT) tablet 1 tablet (not administered)  sertraline (ZOLOFT) tablet 50 mg (not administered)  cinacalcet (SENSIPAR) tablet 30 mg (not administered)  fenofibrate tablet 160 mg (not administered)  sevelamer carbonate (RENVELA) tablet 2,400 mg (not administered)  0.9 %  sodium chloride infusion (not administered)  sodium chloride flush (NS) 0.9 % injection 3 mL (not administered)  acetaminophen (TYLENOL) tablet 650 mg (not administered)    Or  acetaminophen (TYLENOL) suppository 650 mg (not administered)  ondansetron (ZOFRAN) tablet 4 mg (not administered)    Or  ondansetron (ZOFRAN) injection 4 mg (not administered)  albuterol (PROVENTIL) (2.5 MG/3ML) 0.083% nebulizer solution 2.5 mg (not administered)  sodium chloride 0.9 % bolus 250 mL (0 mLs Intravenous Stopped 11/07/2016 2039)     Initial Impression / Assessment and Plan / ED Course  I have reviewed the triage vital signs and the nursing notes.  Pertinent labs & imaging results that were available during my care of the patient were reviewed by me and considered in my medical decision making (see chart for details).    Consult:[facility]. [facility] hospitalist for admission.  Final Clinical Impressions(s) / ED Diagnoses   Final diagnoses:  Syncope  ESRD (end stage renal disease) on dialysis (HCC)  Pleural effusion  Hypoxia  Patient is brought from dialysis with the reported episode of unresponsiveness. Patient's mental status has been clear in the emergency department. She has appropriate recall and does not have any type of aphasia. She is somnolent extremely deconditioned at baseline. She has significant comorbid illness. Patient reported she is chronically hypotensive. She reports that is not a new. She also reports she chronically has a irregular heart rate. She denies any pain. She minimally endorses any symptoms really. She does discuss depression which has been a significant issue  for her for a year since the loss of her husband and son. She has been given a 250 mL fluid bolus for attention. She has tolerated this without increasing dyspnea. Prior echo does show history of pericardial effusion. This was discussed with Dr. Youlanda Roys, will plan to admit for ongoing monitoring and echo cardiogram as well. He is on supplemental oxygen. Plan will be for admission for ongoing inpatient evaluation and treatment.  New Prescriptions New Prescriptions   No medications on file     Arby Barrette, MD 11/25/2016 2148

## 2016-11-15 ENCOUNTER — Inpatient Hospital Stay (HOSPITAL_COMMUNITY): Payer: Medicare Other

## 2016-11-15 ENCOUNTER — Encounter (HOSPITAL_COMMUNITY): Payer: Medicare Other

## 2016-11-15 ENCOUNTER — Encounter (HOSPITAL_COMMUNITY): Payer: Self-pay | Admitting: *Deleted

## 2016-11-15 DIAGNOSIS — R55 Syncope and collapse: Secondary | ICD-10-CM

## 2016-11-15 DIAGNOSIS — I509 Heart failure, unspecified: Secondary | ICD-10-CM

## 2016-11-15 DIAGNOSIS — N186 End stage renal disease: Secondary | ICD-10-CM

## 2016-11-15 DIAGNOSIS — Z992 Dependence on renal dialysis: Secondary | ICD-10-CM

## 2016-11-15 DIAGNOSIS — I480 Paroxysmal atrial fibrillation: Secondary | ICD-10-CM

## 2016-11-15 LAB — HEPATIC FUNCTION PANEL
ALT: 19 U/L (ref 14–54)
AST: 33 U/L (ref 15–41)
Albumin: 2.9 g/dL — ABNORMAL LOW (ref 3.5–5.0)
Alkaline Phosphatase: 81 U/L (ref 38–126)
BILIRUBIN DIRECT: 0.4 mg/dL (ref 0.1–0.5)
BILIRUBIN TOTAL: 1 mg/dL (ref 0.3–1.2)
Indirect Bilirubin: 0.6 mg/dL (ref 0.3–0.9)
Total Protein: 6.4 g/dL — ABNORMAL LOW (ref 6.5–8.1)

## 2016-11-15 LAB — LIPID PANEL
CHOL/HDL RATIO: 2.9 ratio
CHOLESTEROL: 113 mg/dL (ref 0–200)
HDL: 39 mg/dL — AB (ref >40)
LDL Cholesterol: 49 mg/dL (ref 0–99)
TRIGLYCERIDES: 125 mg/dL (ref <150)
VLDL: 25 mg/dL (ref 0–40)

## 2016-11-15 LAB — GLUCOSE, CAPILLARY: Glucose-Capillary: 79 mg/dL (ref 65–99)

## 2016-11-15 LAB — TSH: TSH: 2.76 u[IU]/mL (ref 0.350–4.500)

## 2016-11-15 LAB — BASIC METABOLIC PANEL
Anion gap: 16 — ABNORMAL HIGH (ref 5–15)
BUN: 21 mg/dL — AB (ref 6–20)
CALCIUM: 8.5 mg/dL — AB (ref 8.9–10.3)
CO2: 24 mmol/L (ref 22–32)
CREATININE: 6.52 mg/dL — AB (ref 0.44–1.00)
Chloride: 98 mmol/L — ABNORMAL LOW (ref 101–111)
GFR calc Af Amer: 7 mL/min — ABNORMAL LOW (ref >60)
GFR, EST NON AFRICAN AMERICAN: 6 mL/min — AB (ref >60)
GLUCOSE: 68 mg/dL (ref 65–99)
Potassium: 5.4 mmol/L — ABNORMAL HIGH (ref 3.5–5.1)
SODIUM: 138 mmol/L (ref 135–145)

## 2016-11-15 LAB — CBC
HCT: 58.5 % — ABNORMAL HIGH (ref 36.0–46.0)
Hemoglobin: 16.6 g/dL — ABNORMAL HIGH (ref 12.0–15.0)
MCH: 25.5 pg — ABNORMAL LOW (ref 26.0–34.0)
MCHC: 28.4 g/dL — AB (ref 30.0–36.0)
MCV: 90 fL (ref 78.0–100.0)
Platelets: 51 10*3/uL — ABNORMAL LOW (ref 150–400)
RBC: 6.5 MIL/uL — ABNORMAL HIGH (ref 3.87–5.11)
RDW: 18.8 % — AB (ref 11.5–15.5)
WBC: 6.7 10*3/uL (ref 4.0–10.5)

## 2016-11-15 LAB — BLOOD GAS, ARTERIAL
Acid-Base Excess: 2.4 mmol/L — ABNORMAL HIGH (ref 0.0–2.0)
BICARBONATE: 29.7 mmol/L — AB (ref 20.0–28.0)
Drawn by: 246861
O2 Content: 2 L/min
O2 Saturation: 97.7 %
PCO2 ART: 77.5 mmHg — AB (ref 32.0–48.0)
PH ART: 7.208 — AB (ref 7.350–7.450)
Patient temperature: 98.6
pO2, Arterial: 121 mmHg — ABNORMAL HIGH (ref 83.0–108.0)

## 2016-11-15 LAB — MRSA PCR SCREENING: MRSA by PCR: NEGATIVE

## 2016-11-15 LAB — ECHOCARDIOGRAM COMPLETE
HEIGHTINCHES: 62 in
WEIGHTICAEL: 3432.12 [oz_av]

## 2016-11-15 LAB — PROTIME-INR
INR: 2.72
Prothrombin Time: 29.4 seconds — ABNORMAL HIGH (ref 11.4–15.2)

## 2016-11-15 LAB — TROPONIN I
TROPONIN I: 0.03 ng/mL — AB (ref <0.03)
TROPONIN I: 0.3 ng/mL — AB (ref <0.03)
Troponin I: 0.03 ng/mL (ref <0.03)
Troponin I: 0.05 ng/mL (ref <0.03)

## 2016-11-15 LAB — CORTISOL: Cortisol, Plasma: 23.8 ug/dL

## 2016-11-15 LAB — AMMONIA: Ammonia: 37 umol/L — ABNORMAL HIGH (ref 9–35)

## 2016-11-15 LAB — HIV ANTIBODY (ROUTINE TESTING W REFLEX): HIV SCREEN 4TH GENERATION: NONREACTIVE

## 2016-11-15 LAB — CORTISOL-AM, BLOOD: Cortisol - AM: 19.3 ug/dL (ref 6.7–22.6)

## 2016-11-15 LAB — PROCALCITONIN: PROCALCITONIN: 1.11 ng/mL

## 2016-11-15 MED ORDER — ASPIRIN 81 MG PO CHEW
81.0000 mg | CHEWABLE_TABLET | Freq: Every day | ORAL | Status: DC
  Administered 2016-11-15: 81 mg via ORAL
  Filled 2016-11-15 (×2): qty 1

## 2016-11-15 MED ORDER — HYDROCORTISONE NA SUCCINATE PF 100 MG IJ SOLR
100.0000 mg | Freq: Three times a day (TID) | INTRAMUSCULAR | Status: DC
  Administered 2016-11-15 – 2016-11-16 (×3): 100 mg via INTRAVENOUS
  Filled 2016-11-15 (×3): qty 2

## 2016-11-15 MED ORDER — WARFARIN SODIUM 5 MG PO TABS
2.5000 mg | ORAL_TABLET | Freq: Once | ORAL | Status: AC
Start: 2016-11-15 — End: 2016-11-15
  Administered 2016-11-15: 2.5 mg via ORAL
  Filled 2016-11-15: qty 1

## 2016-11-15 MED ORDER — SODIUM CHLORIDE 0.9 % IV BOLUS (SEPSIS)
500.0000 mL | Freq: Once | INTRAVENOUS | Status: AC
  Administered 2016-11-15: 500 mL via INTRAVENOUS

## 2016-11-15 MED ORDER — MIDODRINE HCL 5 MG PO TABS
5.0000 mg | ORAL_TABLET | Freq: Three times a day (TID) | ORAL | Status: DC
  Administered 2016-11-15: 5 mg via ORAL
  Filled 2016-11-15: qty 1

## 2016-11-15 MED ORDER — METOPROLOL TARTRATE 25 MG PO TABS
25.0000 mg | ORAL_TABLET | Freq: Every day | ORAL | Status: DC
  Filled 2016-11-15: qty 1

## 2016-11-15 MED ORDER — MIDODRINE HCL 5 MG PO TABS
10.0000 mg | ORAL_TABLET | Freq: Three times a day (TID) | ORAL | Status: DC
  Administered 2016-11-15 – 2016-11-16 (×2): 10 mg via ORAL
  Filled 2016-11-15 (×2): qty 2

## 2016-11-15 NOTE — Consult Note (Signed)
Reason for Consult:colloid cyst Referring Physician: medicine  [name] Bender is an 61 y.o. female.   HPI:  61 year old female with multiple medical problems who has a known colloid cyst evaluated at [facility] years ago who had a syncopal event while in dialysis yesterday. She is hypotensive and she states she hyperventilated and became hypotensive. Part of the workup in the emergency department included a CT scan of the head which showed the colloid cyst. When compared to the previous imaging in 2014 the colloid cyst is unchanged. She denies headaches. She denies visual changes. She denies numbness tingling or weakness.  Past Medical History:  Diagnosis Date  . Atrial fibrillation (HCC)   . Dialysis patient, noncompliant (HCC)   . HTN (hypertension)   . Hypercholesterolemia   . Polycystic kidney disease   . Supraclinoid carotid artery aneurysm, small     Past Surgical History:  Procedure Laterality Date  . APPENDECTOMY    . c-sections     x2  . DG AV DIALYSIS  SHUNT ACCESS EXIST*L* OR    . RIGHT OOPHORECTOMY    . rivght ovary removal     secondary to a cyst  . THROMBECTOMY     of right upper arm ateriovenous graft with revision   . TOTAL KNEE ARTHROPLASTY  feb 2005   right knee    Allergies  Allergen Reactions  . Ancef [Cefazolin Sodium] Itching  . Ancef [Cefazolin]     Social History  Substance Use Topics  . Smoking status: Never Smoker  . Smokeless tobacco: Never Used  . Alcohol use Yes     Comment: occasional    Family History  Problem Relation Age of Onset  . Atrial fibrillation Father   . Stroke Mother      Review of Systems  Positive ROS: Negative  All other systems have been reviewed and were otherwise negative with the exception of those mentioned in the HPI and as above.  Objective: Vital signs in last 24 hours: Temp:  [97.2 F (36.2 C)-97.9 F (36.6 C)] 97.6 F (36.4 C) (05/20 0806) Pulse Rate:  [40-107] 88 (05/20 0305) Resp:  [14-38] 18  (05/20 0806) BP: (53-142)/(38-96) 80/53 (05/20 0806) SpO2:  [86 %-100 %] 92 % (05/20 0305) Weight:  [97.3 kg (214 lb 8.1 oz)] 97.3 kg (214 lb 8.1 oz) (05/20 0305)  General Appearance: Alert, cooperative, no distress, obese Head: Normocephalic, without obvious abnormality, atraumatic Eyes: PERRL, conjunctiva/corneas clear, EOM's intact Neck: Supple, symmetrical, trachea midline Lungs:  respirations unlabored Heart: Irregularly irregular rhythm with A. 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   NEUROLOGIC:   Mental status: A&O x4, no aphasia, good attention span, Memory and fund of knowledge Motor Exam - grossly normal, decreased tone Sensory Exam - grossly normal Reflexes: Absent Coordination - grossly normal Gait - not tested Balance - not tested Cranial Nerves: I: smell Not tested  II: visual acuity  OS: NA    OD: NA  II: visual fields Full to confrontation  II: pupils Equal, round, reactive to light  III,VII: ptosis None  III,IV,VI: extraocular muscles  Full ROM  V: mastication Normal  V: facial light touch sensation  Normal  V,VII: corneal reflex  Present  VII: facial muscle function - upper  Normal  VII: facial muscle function - lower Normal  VIII: hearing Not tested  IX: soft palate elevation  Normal  IX,X: gag reflex Present  XI: trapezius strength  5/5  XI: sternocleidomastoid strength 5/5  XI: neck flexion strength  5/5  XII: tongue strength  Normal    Data Review Lab Results  Component Value Date   WBC 6.7 11/15/2016   HGB 16.6 (H) 11/15/2016   HCT 58.5 (H) 11/15/2016   MCV 90.0 11/15/2016   PLT 51 (L) 11/15/2016   Lab Results  Component Value Date   NA 138 11/15/2016   K 5.4 (H) 11/15/2016   CL 98 (L) 11/15/2016   CO2 24 11/15/2016   BUN 21 (H) 11/15/2016   CREATININE 6.52 (H) 11/15/2016   GLUCOSE 68 11/15/2016   Lab Results  Component Value Date   INR  2.72 11/15/2016    Radiology: Ct Head Wo Contrast  Result Date: 08-Dec-2016 CLINICAL DATA:  Syncope and unresponsiveness. EXAM: CT HEAD WITHOUT CONTRAST TECHNIQUE: Contiguous axial images were obtained from the base of the skull through the vertex without intravenous contrast. COMPARISON:  Report from outside brain MRI dated 06/19/2013, images unavailable for review at the time of dictation. FINDINGS: Brain: There is a hyperdensity at the foramina of [place]ro measuring 17 mm AP x 16 mm transverse. According to the outside MRI report, the lesion measures 17 mm AP previously. There is moderate hydrocephalus with mild periventricular hypoattenuation. No hemorrhage. No extra-axial collection. 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 Vascular: By report, the patient has a right MCA bifurcation aneurysm. This is not visible on this study. The vessels are otherwise unremarkable aside from atherosclerotic calcification at the skullbase. Skull: Normal visualized skull base, calvarium and extracranial soft tissues. Sinuses/Orbits: No sinus fluid levels or advanced mucosal thickening. No mastoid effusion. Normal orbits. IMPRESSION: 1. 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 The AP dimension is unchanged according to the outside MRI report of 06/19/2013. Without comparison images, it is impossible to know the acuity of the hydrocephalus 2. Periventricular hypoattenuation is suggestive of chronic microvascular ischemia. However, in the setting of hydrocephalus, a component of transependymal interstitial edema would be difficult to exclude. 3. Neurosurgical consultation may be appropriate, as colloid cysts may increase in size over time pain can be a source acute obstructive hydrocephalus. Electronically Signed   By: Deatra Robinson M.D.   On: December 08, 2016 22:59   Dg Chest Port 1 View  Result Date: December 08, 2016 CLINICAL DATA:  Shortness of breath during dialysis therapy EXAM: PORTABLE CHEST 1 VIEW  COMPARISON:  11/10/2016 FINDINGS: Cardiac shadow is again enlarged. Aortic calcifications are again seen. Right lung is well aerated without focal infiltrate. Enlarging left-sided pleural effusion and likely underlying atelectasis is seen. No bony abnormality is noted. IMPRESSION: Enlarging left basilar pleural effusion and likely atelectasis. Electronically Signed   By: Alcide Clever M.D.   On: Dec 08, 2016 18:51     Assessment/Plan: 61 year old female with a complex past medical history who has a known stable colloid cyst that is probably asymptomatic. I do not believe she has hydrocephalus or ventriculomegaly, she does not even have enlarged temporal horns. Colloid cyst was diagnosed a few years ago and she chose conservative therapy. She would like to continue that. Episode yesterday appears to be unrelated. She does not have headaches. She in no way wants any aggressive management of the colloid cyst such as evd, shunting or resection, and it appears she would be a poor surgical candidate based on chart review and speaking to her admitting physician. The actual incidence of obstructive hydrocephalus and sudden death is probably quite low and therefore the  risk of surgical treatment at this point is likely higher than the risk of obstructive hydrocephalus and sudden death   Keitha Kolk S Nov 17, 2016 9:11 AM

## 2016-11-15 NOTE — Consult Note (Deleted)
[] Hide copied text [facility] Renal Consultation Note  Indication for Consultation:  Management of ESRD/hemodialysis; anemia, hypertension/volume and secondary hyperparathyroidism  HPI: [name] Bender is a 61 y.o. 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 Fib on Coumadin/ Metoprolol, DM,Spraclinoid carotid artery aneurysm,thrombocytopens,depression ,and  evaluated Spraclinoidcarotid artery [place] in past year   . She is admitted after unresponsive episode  on Dialysis yesterday=per history was on for 2hr 50 min of 4 hr tx with reported 1kg removed and became unresponsive for "43mn", EMS not able to obtain bp ,usually runs low at baseline ( 80-s). In ER 53/38 at arrival>75/53 after 250 NS bolus . Reported  then to be drowsy, but oriented x 3,  answeredall questions appropriately. She denied shortness of breath, chest pain, cough, fever or chills ,N/V/D, abdominal pain, symptoms of UTI. No unilateral weakness, numbness or tingling in his extremities. She has bilateral lower leg edema that is chronic.   Labs in ER=.INR 2.33, WBC 9.1, hemoglobin 17.6, platelet of 66, potassium 4.1, bicarbonate 26, creatinine 5.99, BUN 17, temperature 97.2, heart rate 70 to 90s, O2 89 to 90 on room air, which improved to 94% on 3 L oxygen. Chest x-ray showed moderateleft pleural effusion without infiltration.   This am  Bp 90/46,only cos of depression and need for Med "to help me sleep and my nerves".Has Zoloft listed on home meds. Last year son and husband died ,she lives alone,drives self to HD and admits to missing HD Txs  sec to depression .Missed past Thursday hd 11/12/16.Does not have a PCP aware she needs one.          Past Medical History:  Diagnosis Date  . [place]   . [place]   . HTN (hypertension)   . Hypercholesterolemia   . Polycystic kidney disease   . Supraclinoid  carotid artery aneurysm, small          Past Surgical History:  Procedure Laterality Date  . APPENDECTOMY    . c-sections     x2  . DG AV DIALYSIS  SHUNT ACCESS EXIST*L* OR    . RIGHT OOPHORECTOMY    . rivght ovary removal     secondary to a cyst  . THROMBECTOMY     of right upper arm ateriovenous graft with revision   . TOTAL KNEE ARTHROPLASTY  feb 2005   right knee           Family History  Problem Relation Age of Onset  . Atrial fibrillation Father   . Stroke Mother       reports that she has never smoked. She has never used smokeless tobacco. She reports that she drinks alcohol. She reports that she does not use drugs.       Allergies  Allergen Reactions  . Ancef [Cefazolin Sodium] Itching  . Ancef [Cefazolin]            Prior to Admission medications   Medication Sig Start Date End Date Taking? Authorizing Provider  b complex-vitamin c-folic acid (NEPHRO-VITE) 0.8 MG TABS tablet Take 1 tablet by mouth at bedtime.   Yes [provider]  cinacalcet (SENSIPAR) 30 MG tablet Take 30 mg by mouth daily.     Yes [provider]  digoxin (LANOXIN) 0.125 MG tablet TAKE 1 TABLET BY MOUTH TWO TIMES A WEEK ON TUESDAY & FRIDAY 10/12/16  Yes CLelon Perla MD  fenofibrate (TEctor  145 MG tablet Take 145 mg by mouth daily.     Yes [provider]  metoprolol (LOPRESSOR) 100 MG tablet Take 1 tablet (100 mg total) by mouth daily. 03/30/16  Yes Lelon Perla, MD  sertraline (ZOLOFT) 50 MG tablet Take 50 mg by mouth daily.   Yes [provider]  sevelamer (RENVELA) 800 MG tablet Take 2,400 mg by mouth 3 (three) times daily before meals.    Yes [provider]  warfarin (COUMADIN) 5 MG tablet Take 2.5-5 mg by mouth See admin instructions. Take 1 tablet on Wednesday and Saturday then take 1/2 tablet all the other days   Yes [provider]    XHB:ZJIRCVELFYBOF **OR** acetaminophen,  albuterol, ondansetron **OR** ondansetron (ZOFRAN) IV  Lab Results Last 48 Hours        Results for orders placed or performed during the hospital encounter of 11/22/2016 (from the past 48 hour(s))  I-stat troponin, ED     Status: None   Collection Time: 10/28/2016  4:43 PM  Result Value Ref Range   Troponin i, poc 0.01 0.00 - 0.08 ng/mL   Comment 3            Comment: Due to the release kinetics of cTnI, a negative result within the first hours of the onset of symptoms does not rule out myocardial infarction with certainty. If myocardial infarction is still suspected, repeat the test at appropriate intervals.   CBC     Status: Abnormal   Collection Time: 11/08/2016  5:03 PM  Result Value Ref Range   WBC 9.1 4.0 - 10.5 K/uL   RBC 6.81 (H) 3.87 - 5.11 MIL/uL   Hemoglobin 17.6 (H) 12.0 - 15.0 g/dL   HCT 61.3 (H) 36.0 - 46.0 %   MCV 90.0 78.0 - 100.0 fL   MCH 25.8 (L) 26.0 - 34.0 pg   MCHC 28.7 (L) 30.0 - 36.0 g/dL   RDW 19.1 (H) 11.5 - 15.5 %   Platelets 66 (L) 150 - 400 K/uL    Comment: SPECIMEN CHECKED FOR CLOTS REPEATED TO VERIFY PLATELET COUNT CONFIRMED BY SMEAR   Digoxin level     Status: Abnormal   Collection Time: 11/15/2016  5:03 PM  Result Value Ref Range   Digoxin Level 0.3 (L) 0.8 - 2.0 ng/mL  Protime-INR     Status: Abnormal   Collection Time: 11/12/2016  5:03 PM  Result Value Ref Range   Prothrombin Time 26.0 (H) 11.4 - 15.2 seconds   INR 7.51   Basic metabolic panel     Status: Abnormal   Collection Time: 11/21/2016  6:06 PM  Result Value Ref Range   Sodium 139 135 - 145 mmol/L   Potassium 4.1 3.5 - 5.1 mmol/L   Chloride 97 (L) 101 - 111 mmol/L   CO2 26 22 - 32 mmol/L   Glucose, Bld 95 65 - 99 mg/dL   BUN 17 6 - 20 mg/dL   Creatinine, Ser 5.99 (H) 0.44 - 1.00 mg/dL   Calcium 8.4 (L) 8.9 - 10.3 mg/dL   GFR calc non Af Amer 7 (L) >60 mL/min   GFR calc Af Amer 8 (L) >60 mL/min    Comment: (NOTE) The eGFR has been calculated  using the CKD EPI equation. This calculation has not been validated in all clinical situations. eGFR's persistently <60 mL/min signify possible Chronic Kidney Disease.    Anion gap 16 (H) 5 - 15  Save smear     Status: None  Collection Time: 11/07/2016 11:10 PM  Result Value Ref Range   Smear Review SMEAR STAINED AND AVAILABLE FOR REVIEW   Cortisol-am, blood     Status: None   Collection Time: 10/29/2016 11:10 PM  Result Value Ref Range   Cortisol - AM 19.3 6.7 - 22.6 ug/dL  Troponin I (q 6hr x 3)     Status: Abnormal   Collection Time: 11/13/2016 11:10 PM  Result Value Ref Range   Troponin I 0.03 (HH) <0.03 ng/mL    Comment: CRITICAL RESULT CALLED TO, READ BACK BY AND VERIFIED WITH: IRBY,T RN 11/15/2016 0030 JORDANS   MRSA PCR Screening     Status: None   Collection Time: 11/15/16 12:18 AM  Result Value Ref Range   MRSA by PCR NEGATIVE NEGATIVE    Comment:        The GeneXpert MRSA Assay (FDA approved for NASAL specimens only), is one component of a comprehensive MRSA colonization surveillance program. It is not intended to diagnose MRSA infection nor to guide or monitor treatment for MRSA infections.   Basic metabolic panel     Status: Abnormal   Collection Time: 11/15/16  6:14 AM  Result Value Ref Range   Sodium 138 135 - 145 mmol/L   Potassium 5.4 (H) 3.5 - 5.1 mmol/L   Chloride 98 (L) 101 - 111 mmol/L   CO2 24 22 - 32 mmol/L   Glucose, Bld 68 65 - 99 mg/dL   BUN 21 (H) 6 - 20 mg/dL   Creatinine, Ser 6.52 (H) 0.44 - 1.00 mg/dL   Calcium 8.5 (L) 8.9 - 10.3 mg/dL   GFR calc non Af Amer 6 (L) >60 mL/min   GFR calc Af Amer 7 (L) >60 mL/min    Comment: (NOTE) The eGFR has been calculated using the CKD EPI equation. This calculation has not been validated in all clinical situations. eGFR's persistently <60 mL/min signify possible Chronic Kidney Disease.    Anion gap 16 (H) 5 - 15  CBC     Status: Abnormal   Collection Time: 11/15/16   6:14 AM  Result Value Ref Range   WBC 6.7 4.0 - 10.5 K/uL   RBC 6.50 (H) 3.87 - 5.11 MIL/uL   Hemoglobin 16.6 (H) 12.0 - 15.0 g/dL   HCT 58.5 (H) 36.0 - 46.0 %   MCV 90.0 78.0 - 100.0 fL   MCH 25.5 (L) 26.0 - 34.0 pg   MCHC 28.4 (L) 30.0 - 36.0 g/dL   RDW 18.8 (H) 11.5 - 15.5 %   Platelets 51 (L) 150 - 400 K/uL    Comment: REPEATED TO VERIFY PLATELET COUNT CONFIRMED BY SMEAR   Troponin I (q 6hr x 3)     Status: Abnormal   Collection Time: 11/15/16  6:14 AM  Result Value Ref Range   Troponin I 0.03 (HH) <0.03 ng/mL    Comment: CRITICAL VALUE NOTED.  VALUE IS CONSISTENT WITH PREVIOUSLY REPORTED AND CALLED VALUE.  Protime-INR     Status: Abnormal   Collection Time: 11/15/16  6:14 AM  Result Value Ref Range   Prothrombin Time 29.4 (H) 11.4 - 15.2 seconds   INR 2.72   Lipid panel     Status: Abnormal   Collection Time: 11/15/16  6:14 AM  Result Value Ref Range   Cholesterol 113 0 - 200 mg/dL   Triglycerides 125 <150 mg/dL   HDL 39 (L) >40 mg/dL   Total CHOL/HDL Ratio 2.9 RATIO   VLDL 25 0 - 40  mg/dL   LDL Cholesterol 49 0 - 99 mg/dL    Comment:        Total Cholesterol/HDL:CHD Risk Coronary Heart Disease Risk Table                     Men   Women  1/2 Average Risk   3.4   3.3  Average Risk       5.0   4.4  2 X Average Risk   9.6   7.1  3 X Average Risk  23.4   11.0        Use the calculated Patient Ratio above and the CHD Risk Table to determine the patient's CHD Risk.        ATP III CLASSIFICATION (LDL):  <100     mg/dL   Optimal  100-129  mg/dL   Near or Above                    Optimal  130-159  mg/dL   Borderline  160-189  mg/dL   High  >190     mg/dL   Very High       ROS: see HPI  Physical Exam:     Vitals:   11/15/16 0305 11/15/16 0806  BP: 93/74 (!) 80/53  Pulse: 88   Resp: 18 18  Temp: 97.9 F (36.6 C) 97.6 F (36.4 C)     General: alert,obese, Chronically ill WF NAD, Appropriate  HEENT:[place] , MMM , eomi    Neck: supple , no jvd Heart: Irreg, Irreg, tele rate 90  Distant sounds no rub, mur ,gallop appreciated  Lungs: Sl decr BS at bases otherwise CTA ,unlabored breathing  Abdomen: Obese, BS +, soft With large ventral hernia, Nontender Extremities:bipedal chronic woody type edema   Skin: 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 EDW 94.5 HD Bath 2k,2.25ca  Time 4hr Heparin11,000. Access R fem avgg     Hec 2 mcg IV/HD   no esa or fe    Assessment/Plan  1. Syncopal/ hypotension episodes on HD - bp improved slightly  with IV fluids , On 100 mg Metoprolol. For a. Fib  needs lower dose  2. ESRD -  HD TTS potassium 5.4  No hd need today   3. Hypoxic Resp/failure on admit  /volume- Probably 2/2  severe hypotension and hypoperfusion. = now O2 sat 100% 2 l [place] /Low bp as above / am Cortisol nl , noted decreasing metoprolol dose / ?? May need to use Midodrine change Metoprolol to amiodarone   4. Anemia  - hgb >12 no esa needs 5. Metabolic bone disease -  On Ren vela amd sensipar / check Vit d  [facility] up. 6. Chronic A. Fib= On coumadin and Metoprolol 140m q day /  Digoxin 0.1258mon Tuesday and Friday   7. Depression. On Zoloft ,she is aware needs PCP to adjust med

## 2016-11-15 NOTE — Progress Notes (Addendum)
@IPLOG         PROGRESS NOTE                                                                                                                                                                                                             Patient Demographics:    [name] Bender, is a 62 y.o. female, DOB - 07-Feb-1956, AVW:[id number]  Admit date - 11/07/2016   Admitting Physician Lorretta Harp, MD  Outpatient Primary MD for the patient is Primitivo Gauze, MD  LOS - 1  Chief Complaint  Patient presents with  . Shortness of Breath       Brief Narrative - [name] Bender is a 61 y.o. female with medical history significant of ESRD-HD (TTS), PCK, hypertension, hyperlipidemia, diet-controlled diabetes, depression, atrial fibrillation on Coumadin, Spraclinoid carotid artery aneurysm, thrombocytopenia, she went for her routine dialysis and in the dialysis unit she had a syncopal episode, she was at that time found to have a blood pressure of 50/35. She responded somewhat to IV fluids and was then brought to the ER.   Subjective:    [name] Bender today has, No headache, No chest pain, No abdominal pain - No Nausea, No new weakness tingling or numbness, No Cough - SOB.    Assessment  & Plan :     1.Syncope. This is due to extreme hypotension during the dialysis, blood pressure systolic was close to 50s, although patient has history of low blood pressure but this was much lower than her usual baseline. 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 Will give gentle bolus, cut down beta blocker dose admitted green and monitor.  2. ESRD. On Tuesday, Thursday and Saturday schedule. Nephrology consulted.  3. Dyslipidemia. On fenofibrate continue.  4. Chronic atrial fibrillation [place] vasc 2 score of 3. On Coumadin, continue digoxin and cut down Lopressor since blood pressure runs low.  5. Depression. On Zoloft and  stable.  6. Hypoxic respiratory failure acute which was noted in the ER. Currently resolved. This likely was due to severe hypotension and hypoperfusion. Monitor.  7. Chronic thrombocytopenia. No acute issues. Outpatient follow-up with PCP.  8. Mild Hypercapnic Resp failure noted on ABG (likely due to Syncope and encephalopathy episode )- mentating well, Bipap few hrs    Diet : Diet renal with fluid restriction Fluid restriction: 1200 mL Fluid; Room service appropriate? 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  Echocardiogram done 6 months ago - Normal LV systolic function; severe LAE; mild RAE; small density in posterior pericardial space likely represents epicardial fat; mild TR with moderate to severe elevation in pulmonary pressure; moderate posterior and lateral pericardial effusion.  DVT Prophylaxis  :   Coumadin  Lab Results  Component Value Date   INR 2.72 11/15/2016   INR 2.33 11/05/2016   INR 1.9 (A) 11/03/2016    Lab Results  Component Value Date   PLT 51 (L) 11/15/2016    Inpatient Medications  Scheduled Meds: . cinacalcet  30 mg Oral QAC breakfast  . [START ON Dec 07, 2016] digoxin  0.125 mg Oral Once per day on Tue Fri  . fenofibrate  160 mg Oral Daily  . metoprolol tartrate  25 mg Oral Daily  . multivitamin  1 tablet Oral QHS  . sertraline  50 mg Oral Daily  . sevelamer carbonate  2,400 mg Oral TID AC  . sodium chloride flush  3 mL Intravenous Q12H  . warfarin  2.5 mg Oral ONCE-1800  . Warfarin - Pharmacist Dosing Inpatient   Does not apply q1800   Continuous Infusions: . sodium chloride     PRN Meds:.acetaminophen **OR** acetaminophen, albuterol, ondansetron **OR** ondansetron (ZOFRAN) IV  Antibiotics  :    Anti-infectives    None         Objective:   Vitals:   11/15/16 0055  11/15/16 0100 11/15/16 0305 11/15/16 0806  BP: (!) 106/55 (!) 85/46 93/74 (!) 80/53  Pulse:  (!) 45 88   Resp: (!) 22 (!) 22 18 18   Temp:   97.9 F (36.6 C) 97.6 F (36.4 C)  TempSrc:   Oral Oral  SpO2:  (!) 86% 92%   Weight:   97.3 kg (214 lb 8.1 oz)   Height:        Wt Readings from Last 3 Encounters:  11/15/16 97.3 kg (214 lb 8.1 oz)  11/10/16 89.8 kg (198 lb)  03/11/16 95.3 kg (210 lb)     Intake/Output Summary (Last 24 hours) at 11/15/16 0956 Last data filed at 11/15/16 0500  Gross per 24 hour  Intake              600 ml  Output                0 ml  Net              600 ml     Physical Exam  Awake Alert, No new F.N deficits, Normal affect [place].AT,PERRAL Supple Neck,No JVD, No cervical lymphadenopathy appriciated.  Symmetrical Chest wall movement, Good air movement bilaterally, CTAB RRR,No Gallops,Rubs or new Murmurs, No Parasternal Heave +ve B.Sounds, Abd Soft with a large ventral hernia, No tenderness, No organomegaly appriciated, No rebound - guarding or rigidity. No Cyanosis, Clubbing or edema, No new Rash or bruise      Data Review:    CBC  Recent Labs Lab 11/10/16 1410 11/16/2016 1703 11/15/16 0614  WBC 7.0 9.1 6.7  HGB 16.0* 17.6* 16.6*  HCT 53.4* 61.3* 58.5*  PLT 99* 66* 51*  MCV 88.1 90.0 90.0  MCH 26.4 25.8* 25.5*  MCHC 30.0 28.7* 28.4*  RDW 18.4* 19.1* 18.8*    Chemistries   Recent Labs Lab 11/10/16 1410 11/11/2016 1806 11/15/16 1610  NA 136 139 138  K 4.6 4.1 5.4*  CL 96* 97* 98*  CO2 25 26 24   GLUCOSE 83 95 68  BUN 52* 17 21*  CREATININE 10.28* 5.99* 6.52*  CALCIUM 8.6* 8.4* 8.5*   ------------------------------------------------------------------------------------------------------------------  Recent Labs  11/15/16 0614  CHOL 113  HDL 39*  LDLCALC 49  TRIG 811  CHOLHDL 2.9    No results found for:  HGBA1C ------------------------------------------------------------------------------------------------------------------ No results for input(s): TSH, T4TOTAL, T3FREE, THYROIDAB in the last 72 hours.  Invalid input(s): FREET3 ------------------------------------------------------------------------------------------------------------------ No results for input(s): VITAMINB12, FOLATE, FERRITIN, TIBC, IRON, RETICCTPCT in the last 72 hours.  Coagulation profile  Recent Labs Lab 2016-11-16 1703 11/15/16 0614  INR 2.33 2.72    No results for input(s): DDIMER in the last 72 hours.  Cardiac Enzymes  Recent Labs Lab 11/16/16 2310 11/15/16 0614  TROPONINI 0.03* 0.03*   ------------------------------------------------------------------------------------------------------------------ No results found for: BNP  Micro Results Recent Results (from the past 240 hour(s))  MRSA PCR Screening     Status: None   Collection Time: 11/15/16 12:18 AM  Result Value Ref Range Status   MRSA by PCR NEGATIVE NEGATIVE Final    Comment:        The GeneXpert MRSA Assay (FDA approved for NASAL specimens only), is one component of a comprehensive MRSA colonization surveillance program. It is not intended to diagnose MRSA infection nor to guide or monitor treatment for MRSA infections.     Radiology Reports Dg Chest 2 View  Result Date: 11/10/2016 CLINICAL DATA:  Weakness.  Missed dialysis. EXAM: CHEST  2 VIEW COMPARISON:  05/16/2010 FINDINGS: Cardiomegaly accentuated by low lung volumes. Aortic atherosclerosis and vascular pedicle widening. Mild atelectasis seen in lateral projection as anterior streaky densities. There is no edema, air bronchogram, effusion, or pneumothorax. Exaggerated thoracic kyphosis. Limited visualization of vertebral bodies due to soft tissue attenuation. IMPRESSION: 1. Low lung volumes and mild atelectasis. Negative for edema or definitive pneumonia. 2. Cardiomegaly.  Electronically Signed   By: Marnee Spring M.D.   On: 11/10/2016 13:13   Ct Head Wo Contrast  Result Date: 2016-11-16 CLINICAL DATA:  Syncope and unresponsiveness. EXAM: CT HEAD WITHOUT CONTRAST TECHNIQUE: Contiguous axial images were obtained from the base of the skull through the vertex without intravenous contrast. COMPARISON:  Report from outside brain MRI dated 06/19/2013, images unavailable for review at the time of dictation. FINDINGS: Brain: There is a hyperdensity at the foramina of [place] measuring 17 mm AP x 16 mm transverse. According to the outside MRI report, the lesion measures 17 mm AP previously. There is moderate hydrocephalus with mild periventricular hypoattenuation. No hemorrhage. No extra-axial collection. 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 Vascular: By report, the patient has a right MCA bifurcation aneurysm. This is not visible on this study. The vessels are otherwise unremarkable aside from atherosclerotic calcification at the skullbase. Skull: Normal visualized skull base, calvarium and extracranial soft tissues. Sinuses/Orbits: No sinus fluid levels or advanced mucosal thickening. No mastoid effusion. Normal orbits. IMPRESSION: 1. 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 The AP dimension is unchanged according to the outside MRI report of 06/19/2013. Without comparison images, it is impossible to know the acuity of the hydrocephalus 2. Periventricular hypoattenuation is suggestive of chronic microvascular ischemia. However, in the setting of hydrocephalus, a component of transependymal interstitial edema would be difficult to exclude. 3. Neurosurgical consultation may be appropriate, as colloid cysts may increase in size over time pain can be a source acute obstructive  hydrocephalus. Electronically Signed   By: Deatra RobinsonKevin  Herman M.D.   On: 16-Jan-2017 22:59   Dg Chest Port 1 View  Result Date: 04/06/17 CLINICAL DATA:  Shortness of breath during  dialysis therapy EXAM: PORTABLE CHEST 1 VIEW COMPARISON:  11/10/2016 FINDINGS: Cardiac shadow is again enlarged. Aortic calcifications are again seen. Right lung is well aerated without focal infiltrate. Enlarging left-sided pleural effusion and likely underlying atelectasis is seen. No bony abnormality is noted. IMPRESSION: Enlarging left basilar pleural effusion and likely atelectasis. Electronically Signed   By: Alcide CleverMark  Lukens M.D.   On: 16-Jan-2017 18:51    Time Spent in minutes  30   Susa RaringPrashant Singh M.D on 11/15/2016 at 9:56 AM  Between 7am to 7pm - Pager - [phone] ( page via [email or web address], text pages only, please mention full 10 digit call back number). After 7pm go to [email or web address] - password [email or web address]

## 2016-11-15 NOTE — Consult Note (Signed)
[facility] Renal Consultation Note  Indication for Consultation:  Management of ESRD/hemodialysis; anemia, hypertension/volume and secondary hyperparathyroidism  HPI: [name] Bender is a 61 y.o. 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 Fib on Coumadin/ Metoprolol, DM,Spraclinoid carotid artery aneurysm,thrombocytopens,depression ,and  evaluated Spraclinoidcarotid artery [place] in past year   . She is admitted after unresponsive episode  on Dialysis yesterday=per history was on for 2hr 50 min of 4 hr tx with reported 1kg removed and became unresponsive for "26mn", EMS not able to obtain bp ,usually runs low at baseline ( 80-s). In ER 53/38 at arrival>75/53 after 250 NS bolus . Reported  then to be drowsy, but oriented x 3,  answered all questions appropriately. She denied shortness of breath, chest pain, cough, fever or chills ,N/V/D, abdominal pain, symptoms of UTI. No unilateral weakness, numbness or tingling in his extremities. She has bilateral lower leg edema that is chronic.   Labs in ER=.INR 2.33, WBC 9.1, hemoglobin 17.6, platelet of 66, potassium 4.1, bicarbonate 26, creatinine 5.99, BUN 17, temperature 97.2, heart rate 70 to 90s, O2 89 to 90 on room air, which improved to 94% on 3 L oxygen. Chest x-ray showed moderate left pleural effusion without infiltration.   This am  Bp 90/46,only cos of depression and need for Med "to help me sleep and my nerves".Has Zoloft listed on home meds. Last year son and husband died ,she lives alone,drives self to HD and admits to missing HD Txs  sec to depression .Missed past Thursday hd 11/12/16.Does not have a PCP aware she needs one.      Past Medical History:  Diagnosis Date  . Atrial fibrillation ([place]   . [place], [place]compliant ([place]   . HTN (hypertension)   . Hypercholesterolemia   . Polycystic kidney disease   . Supraclinoid carotid artery aneurysm, small      Past Surgical History:  Procedure Laterality Date  . APPENDECTOMY    . c-sections     x2  . DG AV DIALYSIS  SHUNT ACCESS EXIST*L* OR    . RIGHT OOPHORECTOMY    . rivght ovary removal     secondary to a cyst  . THROMBECTOMY     of right upper arm ateriovenous graft with revision   . TOTAL KNEE ARTHROPLASTY  feb 2005   right knee      Family History  Problem Relation Age of Onset  . Atrial fibrillation Father   . Stroke Mother       reports that she has never smoked. She has never used smokeless tobacco. She reports that she drinks alcohol. She reports that she does not use drugs.   Allergies  Allergen Reactions  . Ancef [Cefazolin Sodium] Itching  . Ancef [Cefazolin]     Prior to Admission medications   Medication Sig Start Date End Date Taking? Authorizing Provider  b complex-vitamin c-folic acid (NEPHRO-VITE) 0.8 MG TABS tablet Take 1 tablet by mouth at bedtime.   Yes [provider]  cinacalcet (SENSIPAR) 30 MG tablet Take 30 mg by mouth daily.     Yes [provider]  digoxin (LANOXIN) 0.125 MG tablet TAKE 1 TABLET BY MOUTH TWO TIMES A WEEK ON TUESDAY & FRIDAY 10/12/16  Yes CLelon Perla MD  fenofibrate (TRICOR) 145 MG tablet Take 145 mg by mouth daily.     Yes [provider]  metoprolol (LOPRESSOR) 100 MG tablet Take 1 tablet (  100 mg total) by mouth daily. 03/30/16  Yes Lelon Perla, MD  sertraline (ZOLOFT) 50 MG tablet Take 50 mg by mouth daily.   Yes [provider]  sevelamer (RENVELA) 800 MG tablet Take 2,400 mg by mouth 3 (three) times daily before meals.    Yes [provider]  warfarin (COUMADIN) 5 MG tablet Take 2.5-5 mg by mouth See admin instructions. Take 1 tablet on Wednesday and Saturday then take 1/2 tablet all the other days   Yes [provider]     Results for orders placed or performed during the hospital encounter of 10/29/2016 (from the past 48 hour(s))  I-stat troponin, ED      Status: None   Collection Time: 11/21/2016  4:43 PM  Result Value Ref Range   Troponin i, poc 0.01 0.00 - 0.08 ng/mL   Comment 3            Comment: Due to the release kinetics of cTnI, a negative result within the first hours of the onset of symptoms does not rule out myocardial infarction with certainty. If myocardial infarction is still suspected, repeat the test at appropriate intervals.   CBC     Status: Abnormal   Collection Time: 11/02/2016  5:03 PM  Result Value Ref Range   WBC 9.1 4.0 - 10.5 K/uL   RBC 6.81 (H) 3.87 - 5.11 MIL/uL   Hemoglobin 17.6 (H) 12.0 - 15.0 g/dL   HCT 61.3 (H) 36.0 - 46.0 %   MCV 90.0 78.0 - 100.0 fL   MCH 25.8 (L) 26.0 - 34.0 pg   MCHC 28.7 (L) 30.0 - 36.0 g/dL   RDW 19.1 (H) 11.5 - 15.5 %   Platelets 66 (L) 150 - 400 K/uL    Comment: SPECIMEN CHECKED FOR CLOTS REPEATED TO VERIFY PLATELET COUNT CONFIRMED BY SMEAR   Digoxin level     Status: Abnormal   Collection Time: 11/22/2016  5:03 PM  Result Value Ref Range   Digoxin Level 0.3 (L) 0.8 - 2.0 ng/mL  Protime-INR     Status: Abnormal   Collection Time: 11/01/2016  5:03 PM  Result Value Ref Range   Prothrombin Time 26.0 (H) 11.4 - 15.2 seconds   INR 5.40   Basic metabolic panel     Status: Abnormal   Collection Time: 11/21/2016  6:06 PM  Result Value Ref Range   Sodium 139 135 - 145 mmol/L   Potassium 4.1 3.5 - 5.1 mmol/L   Chloride 97 (L) 101 - 111 mmol/L   CO2 26 22 - 32 mmol/L   Glucose, Bld 95 65 - 99 mg/dL   BUN 17 6 - 20 mg/dL   Creatinine, Ser 5.99 (H) 0.44 - 1.00 mg/dL   Calcium 8.4 (L) 8.9 - 10.3 mg/dL   GFR calc non Af Amer 7 (L) >60 mL/min   GFR calc Af Amer 8 (L) >60 mL/min    Comment: (NOTE) The eGFR has been calculated using the CKD EPI equation. This calculation has not been validated in all clinical situations. eGFR's persistently <60 mL/min signify possible Chronic Kidney Disease.    Anion gap 16 (H) 5 - 15  Save smear     Status: None   Collection Time: 11/06/2016 11:10  PM  Result Value Ref Range   Smear Review SMEAR STAINED AND AVAILABLE FOR REVIEW   Cortisol-am, blood     Status: None   Collection Time: 11/18/2016 11:10 PM  Result Value Ref Range   Cortisol -  AM 19.3 6.7 - 22.6 ug/dL  Troponin I (q 6hr x 3)     Status: Abnormal   Collection Time: 11/18/2016 11:10 PM  Result Value Ref Range   Troponin I 0.03 (HH) <0.03 ng/mL    Comment: CRITICAL RESULT CALLED TO, READ BACK BY AND VERIFIED WITH: IRBY,T RN 11/15/2016 0030 JORDANS   MRSA PCR Screening     Status: None   Collection Time: 11/15/16 12:18 AM  Result Value Ref Range   MRSA by PCR NEGATIVE NEGATIVE    Comment:        The GeneXpert MRSA Assay (FDA approved for NASAL specimens only), is one component of a comprehensive MRSA colonization surveillance program. It is not intended to diagnose MRSA infection nor to guide or monitor treatment for MRSA infections.   Basic metabolic panel     Status: Abnormal   Collection Time: 11/15/16  6:14 AM  Result Value Ref Range   Sodium 138 135 - 145 mmol/L   Potassium 5.4 (H) 3.5 - 5.1 mmol/L   Chloride 98 (L) 101 - 111 mmol/L   CO2 24 22 - 32 mmol/L   Glucose, Bld 68 65 - 99 mg/dL   BUN 21 (H) 6 - 20 mg/dL   Creatinine, Ser 6.52 (H) 0.44 - 1.00 mg/dL   Calcium 8.5 (L) 8.9 - 10.3 mg/dL   GFR calc non Af Amer 6 (L) >60 mL/min   GFR calc Af Amer 7 (L) >60 mL/min    Comment: (NOTE) The eGFR has been calculated using the CKD EPI equation. This calculation has not been validated in all clinical situations. eGFR's persistently <60 mL/min signify possible Chronic Kidney Disease.    Anion gap 16 (H) 5 - 15  CBC     Status: Abnormal   Collection Time: 11/15/16  6:14 AM  Result Value Ref Range   WBC 6.7 4.0 - 10.5 K/uL   RBC 6.50 (H) 3.87 - 5.11 MIL/uL   Hemoglobin 16.6 (H) 12.0 - 15.0 g/dL   HCT 58.5 (H) 36.0 - 46.0 %   MCV 90.0 78.0 - 100.0 fL   MCH 25.5 (L) 26.0 - 34.0 pg   MCHC 28.4 (L) 30.0 - 36.0 g/dL   RDW 18.8 (H) 11.5 - 15.5 %    Platelets 51 (L) 150 - 400 K/uL    Comment: REPEATED TO VERIFY PLATELET COUNT CONFIRMED BY SMEAR   Troponin I (q 6hr x 3)     Status: Abnormal   Collection Time: 11/15/16  6:14 AM  Result Value Ref Range   Troponin I 0.03 (HH) <0.03 ng/mL    Comment: CRITICAL VALUE NOTED.  VALUE IS CONSISTENT WITH PREVIOUSLY REPORTED AND CALLED VALUE.  Protime-INR     Status: Abnormal   Collection Time: 11/15/16  6:14 AM  Result Value Ref Range   Prothrombin Time 29.4 (H) 11.4 - 15.2 seconds   INR 2.72   Lipid panel     Status: Abnormal   Collection Time: 11/15/16  6:14 AM  Result Value Ref Range   Cholesterol 113 0 - 200 mg/dL   Triglycerides 125 <150 mg/dL   HDL 39 (L) >40 mg/dL   Total CHOL/HDL Ratio 2.9 RATIO   VLDL 25 0 - 40 mg/dL   LDL Cholesterol 49 0 - 99 mg/dL    Comment:        Total Cholesterol/HDL:CHD Risk Coronary Heart Disease Risk Table  Men   Women  1/2 Average Risk   3.4   3.3  Average Risk       5.0   4.4  2 X Average Risk   9.6   7.1  3 X Average Risk  23.4   11.0        Use the calculated Patient Ratio above and the CHD Risk Table to determine the patient's CHD Risk.        ATP III CLASSIFICATION (LDL):  <100     mg/dL   Optimal  100-129  mg/dL   Near or Above                    Optimal  130-159  mg/dL   Borderline  160-189  mg/dL   High  >190     mg/dL   Very High     ROS: see HPI  Physical Exam: Vitals:   11/15/16 0305 11/15/16 0806  BP: 93/74 (!) 80/53  Pulse: 88   Resp: 18 18  Temp: 97.9 F (36.6 C) 97.6 F (36.4 C)     General: alert,obese, Chronically ill WF NAD, Appropriate  HEENT:[place] , MMM , eomi  Neck: supple , no jvd Heart: Irreg, Irreg, tele rate 90  Distant sounds no rub, mur ,gallop appreciated  Lungs: Sl decr BS at bases otherwise CTA ,unlabored breathing  Abdomen: Obese, BS +, soft With large ventral hernia, Nontender Extremities:bipedal chronic woody type edema   Skin: no overt rash with elephantiasis  Skin  appearance lower extrem ./ bilat toenail fungus  Neuro:alert OX3 , moves all extrem  No overt acute focal deficits appreciated  Dialysis Access: R fem AVGG distant Bruit   Dialysis Orders: pending, dry wt 94kg   Assessment/Plan 1. Syncopal/ hypotension episodes on HD - bp improved with IV fluids , On 100 mg Metoprolol. For a. Fib  needs lower dose This hypotension is new as off about one week ago, 2 wks ago BP's were normal at 120/ 70. Pt is lethargic, could be cardiogenic (at risk for RHF w/ hx pHTN); doesn't really look septic. Have asked for CCM to evaluate and have d/w primary team.  Check ABG and NH3 also.   2. ESRD -  HD TTS potassium 5.4  No hd need today on dialysis for 13 years according to pt 3. Hypoxic Resp/failure on admit  /volume- Probably 2/2  severe hypotension and hypoperfusion. = now O2 sat 100% 2 l [place] /Low bp as above / am Cortisol nl , noted decreasing metoprolol dose / ?? May need to use Midodrine change Metoprolol to amiodarone   4. Anemia  - hgb >12 no esa needs 5. Metabolic bone disease -  On Ren vela amd sensipar / check Vit d  [facility]. 6. Chronic A. Fib= On coumadin and Metoprolol 124m q day /  Digoxin 0.1240mon Tuesday and Friday   7. Depression. On Zoloft ,she is aware needs PCP to adjust med   DaErnest HaberPA-C [phone]/20/2018, 8:42 AM   Pt seen, examined, agree w assess/plan as above with additions as indicated.  RoKelly SplinterD CaNewell Rubbermaidager [phone]  cell [phone]/20/2018, 2:34 PM

## 2016-11-15 NOTE — Consult Note (Signed)
PULMONARY / CRITICAL CARE MEDICINE   Name: JOSELYNNE KILLAM MRN: [id number] DOB: June 05, 1956    ADMISSION DATE:  2016/11/27 CONSULTATION DATE:  5/20  REFERRING MD:  Arta Silence   CHIEF COMPLAINT:  Hypotension   HISTORY OF PRESENT ILLNESS:    61 year old female w/ MMP. ESRD (TTS), AF (on chronic a/c), DM, spraclinoid carotid A aneurysm depression and thrombocytopenia. Admitted on 5/19 from the HD clinic after what was felt to be syncopal episode lasting ~ 2 minutes. Per renal discussion over last few weeks at HD BP has been in 80-90s but prior to this in low 100s. EMS was called during this witnessed unresponsive event. On arrival they were unable to obtain a BP. She was treated w/ NS bolus w/ improvement of BP from 53/38 up to 75/53. She was admitted to SDU setting w/ working dx of: syncope, presumed related to hypotension (felt hypovolemia related). Following IVF administration her BP and Mental status improved. She was admitted to the SDU setting. As of 5/20 her I&O balance is + 600 ml. Her initial BP has again trended down towards upper to mid 70s systolic. PCCM asked to assess her given on-going hypotension.   PAST MEDICAL HISTORY :  She  has a past medical history of Atrial fibrillation (HCC); Dialysis patient, noncompliant (HCC); HTN (hypertension); Hypercholesterolemia; Polycystic kidney disease; and Supraclinoid carotid artery aneurysm, small.  PAST SURGICAL HISTORY: She  has a past surgical history that includes Thrombectomy; rivght ovary removal; c-sections; Appendectomy; Total knee arthroplasty (feb 2005); Right oophorectomy; and DG AV DIALYSIS  SHUNT ACCESS EXIST*L* OR.  Allergies  Allergen Reactions  . Ancef [Cefazolin Sodium] Itching  . Ancef [Cefazolin]     No current facility-administered medications on file prior to encounter.    Current Outpatient Prescriptions on File Prior to Encounter  Medication Sig  . b complex-vitamin c-folic acid (NEPHRO-VITE) 0.8 MG TABS tablet Take 1  tablet by mouth at bedtime.  . cinacalcet (SENSIPAR) 30 MG tablet Take 30 mg by mouth daily.    . digoxin (LANOXIN) 0.125 MG tablet TAKE 1 TABLET BY MOUTH TWO TIMES A WEEK ON TUESDAY & FRIDAY  . fenofibrate (TRICOR) 145 MG tablet Take 145 mg by mouth daily.    . metoprolol (LOPRESSOR) 100 MG tablet Take 1 tablet (100 mg total) by mouth daily.  . sertraline (ZOLOFT) 50 MG tablet Take 50 mg by mouth daily.  . sevelamer (RENVELA) 800 MG tablet Take 2,400 mg by mouth 3 (three) times daily before meals.     FAMILY HISTORY:  Her indicated that her mother is deceased. She indicated that her father is deceased.    SOCIAL HISTORY: She  reports that she has never smoked. She has never used smokeless tobacco. She reports that she drinks alcohol. She reports that she does not use drugs.  REVIEW OF SYSTEMS:   Review of Systems:   Bolds are positive  Constitutional: weight loss, gain, night sweats, Fevers, chills, fatigue .  HEENT: headaches, Sore throat, sneezing, nasal congestion, post nasal drip, Difficulty swallowing, Tooth/dental problems, visual complaints visual changes, ear ache CV:  chest pain, Orthopnea, PND, swelling in lower extremities, chronic, dizziness, palpitations, syncope.  GI  heartburn, indigestion, abdominal pain, nausea, vomiting, diarrhea, change in bowel habits, loss of appetite, bloody stools.  Resp: cough,  , hemoptysis, dyspnea w/ minimal exertion, chest pain, pleuritic.  Skin: rash or itching or icterus GU: dysuria, change in color of urine, urgency or frequency. flank pain, hematuria  MS: joint pain  or swelling. decreased range of motion  Psych: change in mood or affect. depression or anxiety.  Neuro: difficulty with speech, weakness, numbness, ataxia   SUBJECTIVE:  No distress. Itchy and uncomfortable   VITAL SIGNS: BP (!) 72/52 (BP Location: Left Wrist)   Pulse 88   Temp 97.4 F (36.3 C) (Oral)   Resp (!) 28   Ht 5\' 2"  (1.575 m)   Wt 214 lb 8.1 oz (97.3 kg)    SpO2 97%   BMI 39.23 kg/m   HEMODYNAMICS:    VENTILATOR SETTINGS:    INTAKE / OUTPUT: I/O last 3 completed shifts: In: 600 [I.V.:350; IV Piggyback:250] Out: -   PHYSICAL EXAMINATION: General appearance:  61 Year old female, chronic NAD,conversant  Eyes: anicteric sclerae, moist conjunctivae; PERRL, EOMI bilaterally. Mouth:  membranes and no mucosal ulcerations; normal hard and soft palate Neck: Trachea midline; neck supple, no JVD Lungs/chest: CTA, with normal respiratory effort and no intercostal retractions CV: RRR, no MRGs  Abdomen: Soft, non-tender;  Huge mid abd hernia  Extremities: chronic LE peripheral edema or extremity lymphadenopathy Skin: Normal temperature, dry texture; no rash, ulcers or subcutaneous nodules Psych: depressed affect, alert and oriented to person, place and time LABS:  BMET  Recent Labs Lab 11/10/16 1410 08/18/16 1806 11/15/16 0614  NA 136 139 138  K 4.6 4.1 5.4*  CL 96* 97* 98*  CO2 25 26 24   BUN 52* 17 21*  CREATININE 10.28* 5.99* 6.52*  GLUCOSE 83 95 68    Electrolytes  Recent Labs Lab 11/10/16 1410 08/18/16 1806 11/15/16 0614  CALCIUM 8.6* 8.4* 8.5*    CBC  Recent Labs Lab 11/10/16 1410 08/18/16 1703 11/15/16 0614  WBC 7.0 9.1 6.7  HGB 16.0* 17.6* 16.6*  HCT 53.4* 61.3* 58.5*  PLT 99* 66* 51*    Coag's  Recent Labs Lab 08/18/16 1703 11/15/16 0614  INR 2.33 2.72    Sepsis Markers No results for input(s): LATICACIDVEN, PROCALCITON, O2SATVEN in the last 168 hours.  ABG No results for input(s): PHART, PCO2ART, PO2ART in the last 168 hours.  Liver Enzymes No results for input(s): AST, ALT, ALKPHOS, BILITOT, ALBUMIN in the last 168 hours.  Cardiac Enzymes  Recent Labs Lab 08/18/16 2310 11/15/16 0614 11/15/16 1128  TROPONINI 0.03* 0.03* 0.30*    Glucose  Recent Labs Lab 11/15/16 0817  GLUCAP 79    Imaging Ct Head Wo Contrast  Result Date: 07-22-2016 CLINICAL DATA:  Syncope and  unresponsiveness. EXAM: CT HEAD WITHOUT CONTRAST TECHNIQUE: Contiguous axial images were obtained from the base of the skull through the vertex without intravenous contrast. COMPARISON:  Report from outside brain MRI dated 06/19/2013, images unavailable for review at the time of dictation. FINDINGS: Brain: There is a hyperdensity at the foramina of [place] measuring 17 mm AP x 16 mm transverse. According to the outside MRI report, the lesion measures 17 mm AP previously. There is moderate hydrocephalus with mild periventricular hypoattenuation. No hemorrhage. No extra-axial collection. 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 Vascular: By report, the patient has a right MCA bifurcation aneurysm. This is not visible on this study. The vessels are otherwise unremarkable aside from atherosclerotic calcification at the skullbase. Skull: Normal visualized skull base, calvarium and extracranial soft tissues. Sinuses/Orbits: No sinus fluid levels or advanced mucosal thickening. No mastoid effusion. Normal orbits. IMPRESSION: 1. 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 The AP dimension is unchanged according to the outside MRI report of 06/19/2013. Without comparison images, it is  impossible to know the acuity of the hydrocephalus 2. Periventricular hypoattenuation is suggestive of chronic microvascular ischemia. However, in the setting of hydrocephalus, a component of transependymal interstitial edema would be difficult to exclude. 3. Neurosurgical consultation may be appropriate, as colloid cysts may increase in size over time pain can be a source acute obstructive hydrocephalus. Electronically Signed   By: Deatra Robinson M.D.   On: 11/03/2016 22:59   Dg Chest Port 1 View  Result Date: 11/26/2016 CLINICAL DATA:  Shortness of breath during dialysis therapy EXAM: PORTABLE CHEST 1 VIEW COMPARISON:  11/10/2016 FINDINGS: Cardiac shadow is again enlarged. Aortic calcifications are again seen.  Right lung is well aerated without focal infiltrate. Enlarging left-sided pleural effusion and likely underlying atelectasis is seen. No bony abnormality is noted. IMPRESSION: Enlarging left basilar pleural effusion and likely atelectasis. Electronically Signed   By: Alcide Clever M.D.   On: 11/11/2016 18:51     STUDIES:  ECHO 5/20>>> CULTURES:   ANTIBIOTICS:   SIGNIFICANT EVENTS:   LINES/TUBES:    ASSESSMENT / PLAN:  Acute Hypercarbic Respiratory failure  Obesity  -I think this is primarily effectively obesity hypoventilation and profound deconditioning w/out the ability to compensated metabolically.  Plan  PRN BIPAP Avoid narcotics   Hypotension.  Atrial fib  -etiology unclear. Likely some degree chronic in the setting of her ESRD Plan:  Increase midodrine to 10 tid D/c beta-blocker  Cont gentle IVFs ? May need to re-evaluate what ideal dry weight is.  Ck lactic acid  Agree w/ f/u echo Cont stress dose steroids (baseline cort boarderline)   ESRD TTH w/ failure to thrive  Mild hyperkalemia  Plan:   Per renal   Severe protein calorie malnutrition  Plan Supplement as able   Thrombocytopenia  Plan Trend cbc  Depression  Plan Supportive care   Discussion 61 year old female w/ end-stage renal disease, medical non-compliance (frequently misses dialysis and does not take her meds). She spends most of her day in a chair and usually stays in pajamas. I do not think that we are dealing with anything acute necessarily, but more the accumulation of chronic illness which she has been non-compliant w/ treatment. This has resulted in what is likely: uremia, worsening deconditioning, weakness, decompensated hypercarbic respiratory failure w/ ineffective metabolic compensation, and further more probably secondary PAH and cor pulmonale. She is depressed, but I think given her co-morbids and the fact that she lost both her husband and son about 2 years ago this is not a  surprising issue. At any rate I am not sure she can even tolerate HD at this point. For now recs are as follows: check PCT (ensure no acute infection), increase midodrine, 500 ml fluid challenge, BIPAP PRN. I have made her DNR and discussed this with her sister Dennie Bible as well as her son Riki Rusk. I think we are nearing the end of her life soon.   Simonne Martinet ACNP-BC [facility] Pulmonary/Critical Care Pager # [phone] OR # [phone] if no answer  11/15/2016, 1:48 PM

## 2016-11-15 NOTE — Progress Notes (Signed)
  Echocardiogram 2D Echocardiogram has been performed.  [name] Bender 11/15/2016, 4:14 PM

## 2016-11-15 NOTE — Evaluation (Signed)
Clinical/Bedside Swallow Evaluation Patient Details  Name: [name] Bender MRN: [id number] Date of Birth: [birth year]  Today's Date: 11/15/2016 Time: SLP Start Time (ACUTE ONLY): 0910 SLP Stop Time (ACUTE ONLY): 0940 SLP Time Calculation (min) (ACUTE ONLY): 30 min  Past Medical History:  Past Medical History:  Diagnosis Date  . Atrial fibrillation (HCC)   . Dialysis patient, noncompliant (HCC)   . HTN (hypertension)   . Hypercholesterolemia   . Polycystic kidney disease   . Supraclinoid carotid artery aneurysm, small    Past Surgical History:  Past Surgical History:  Procedure Laterality Date  . APPENDECTOMY    . c-sections     x2  . DG AV DIALYSIS  SHUNT ACCESS EXIST*L* OR    . RIGHT OOPHORECTOMY    . rivght ovary removal     secondary to a cyst  . THROMBECTOMY     of right upper arm ateriovenous graft with revision   . TOTAL KNEE ARTHROPLASTY  feb 2005   right knee   HPI:  61 yo female adm to [facility] with AMS, decreased responsiveness and shortness of breath while at dialysis.  PMH + for hydrocephalus - prior diagnosis 2014, HLD, ESRD, DM, Afib, obesity.  Pt with bilateral LE edema with mild right erythematous.  CXR showed left pleural effusion without infiltrate.     Assessment / Plan / Recommendation Clinical Impression  Pt presents with functional oropharyngeal swallow ability based on clinical swallow evaluation.  CN exam unremarkable and pt with adequate oral transiting, pharyngeal swallow with small single boluses.  3 ounce water test resulted in pt ceases at approx 2 ounces and having delayed throat clearing.  Pt does report having a large hiatal hernia and states she may have reflux dependent on what she eats.  Educated her to test results and advised to general aspiraton/esophageal precautions.  She further reports her swallow to be normal at this time.  No SlP follow up indicated, thanks for this consult.  SLP Visit Diagnosis: Dysphagia, unspecified (R13.10)     Aspiration Risk  Mild aspiration risk    Diet Recommendation Regular;Thin liquid   Liquid Administration via: Cup;Straw Medication Administration: Whole meds with liquid Supervision: Patient able to self feed Compensations: Minimize environmental distractions;Slow rate;Small sips/bites Postural Changes: Seated upright at 90 degrees    Other  Recommendations Oral Care Recommendations: Oral care BID   Follow up Recommendations        Frequency and Duration min 1 x/week  1 week       Prognosis        Swallow Study   General Date of Onset: 11/15/16 HPI: 61 yo female adm to [facility] with AMS, decreased responsiveness and shortness of breath while at dialysis.  PMH + for hydrocephalus - prior diagnosis 2014, HLD, ESRD, DM, Afib, obesity.  Pt with bilateral LE edema with mild right erythematous.  CXR showed left pleural effusion without infiltrate.   Type of Study: Bedside Swallow Evaluation Diet Prior to this Study: NPO Temperature Spikes Noted: No Respiratory Status: Nasal cannula History of Recent Intubation: No Behavior/Cognition: Alert;Cooperative Oral Cavity Assessment: Within Functional Limits Oral Care Completed by SLP: No Oral Cavity - Dentition: Adequate natural dentition;Missing dentition (some missing) Vision: Functional for self-feeding Self-Feeding Abilities: Able to feed self Patient Positioning: Upright in bed (leans to the left) Baseline Vocal Quality: Normal Volitional Cough: Strong    Oral/Motor/Sensory Function Overall Oral Motor/Sensory Function: Within functional limits   Ice Chips Ice chips: Within functional limits Presentation: Spoon;Self Fed  Thin Liquid Thin Liquid: Impaired Presentation: Self Fed;Cup;Straw Pharyngeal  Phase Impairments: Throat Clearing - Immediate Other Comments: with 3 ounce water test, immediate throat clearing; no indication of airway compromise with small single boluses    Nectar Thick Nectar Thick Liquid: Not tested   Honey  Thick Honey Thick Liquid: Not tested   Puree Puree: Within functional limits Presentation: Self Fed;Spoon   Solid   GO   Solid: Within functional limits Presentation: Self Lisabeth Pick, MS [facility] SLP [phone]

## 2016-11-15 NOTE — Evaluation (Signed)
Physical Therapy Evaluation Patient Details Name: [name] FastSheila A Gidney MRN: [id number] DOB: 07/31/1955 Today's Date: 11/15/2016   History of Present Illness  Patient is a 61 yo female admitted 07-20-16 due to syncope during HD session.  Patient with hypotension, SOB, hypoxia, Lt pleural effusion, LE edema.    PMH:  ESRD on HD, HTN, HLD, DM, depression, Afib, carotid artery aneurysm, hydrocephalus, obesity, Rt TKA  Clinical Impression  Patient presents with problems listed below.  Will benefit from acute PT to maximize functional mobility prior to discharge.  Patient has been independent at w/c level at home prior to recent decline in mobility.  Recommend SNF at discharge for continued therapy with goal to return to her home.    Follow Up Recommendations SNF;Supervision/Assistance - 24 hour    Equipment Recommendations  None recommended by PT    Recommendations for Other Services       Precautions / Restrictions Precautions Precautions: Fall Restrictions Weight Bearing Restrictions: No      Mobility  Bed Mobility Overal bed mobility: Needs Assistance Bed Mobility: Rolling;Sidelying to Sit;Sit to Supine Rolling: Min assist Sidelying to sit: Min assist;HOB elevated   Sit to supine: Mod assist   General bed mobility comments: Use of rail for rolling.  Assist to raise trunk to sitting.  Assist to bring LE's onto bed to return to supine.  Transfers                 General transfer comment: NT - patient declined due to pain on bottom.  Patient reports she doesn't think she can do transfers due to weakness.  "I've gone downhill."  Ambulation/Gait                Stairs            Wheelchair Mobility    Modified Rankin (Stroke Patients Only)       Balance Overall balance assessment: Needs assistance Sitting-balance support: Bilateral upper extremity supported Sitting balance-Leahy Scale: Poor                                       Pertinent  Vitals/Pain Pain Assessment: 0-10 Pain Score: 9  Pain Location: bottom Pain Descriptors / Indicators: Sore Pain Intervention(s): Limited activity within patient's tolerance;Monitored during session;Repositioned (Discussed repositioning with pt.  RN notified to apply cream)    Home Living Family/patient expects to be discharged to:: Private residence Living Arrangements: Alone Available Help at Discharge: Family;Available PRN/intermittently (Son works) Type of Home: House Home Access: Ramped entrance     Home Layout: One level Home Equipment: Wheelchair - manual;Bedside commode;Shower seat (Lift chair) Additional Comments: Patient sleeps in her lift chair    Prior Function Level of Independence: Independent with assistive device(s)         Comments: Patient was able to transfer into/out of w/c on her own.  Uses w/c for mobility.   Son provides transportation.     Hand Dominance        Extremity/Trunk Assessment   Upper Extremity Assessment Upper Extremity Assessment: Generalized weakness (Able to reposition using UE's in sitting)    Lower Extremity Assessment Lower Extremity Assessment: RLE deficits/detail;LLE deficits/detail RLE Deficits / Details: Strength grossly 3-/5 LLE Deficits / Details: Strength grossly 3/5       Communication   Communication: No difficulties  Cognition Arousal/Alertness: Awake/alert Behavior During Therapy: WFL for tasks assessed/performed;Flat affect Overall Cognitive Status: Within  Functional Limits for tasks assessed                                        General Comments      Exercises     Assessment/Plan    PT Assessment Patient needs continued PT services  PT Problem List Decreased strength;Decreased activity tolerance;Decreased balance;Decreased mobility;Decreased knowledge of use of DME;Cardiopulmonary status limiting activity;Obesity;Pain       PT Treatment Interventions Functional mobility  training;Therapeutic activities;Therapeutic exercise;Balance training;Patient/family education    PT Goals (Current goals can be found in the Care Plan section)  Acute Rehab PT Goals Patient Stated Goal: To get stronger.  To be able to return home. PT Goal Formulation: With patient Time For Goal Achievement: 11/29/16 Potential to Achieve Goals: Fair    Frequency Min 2X/week   Barriers to discharge Decreased caregiver support Patient lives alone    Co-evaluation               AM-PAC PT "6 Clicks" Daily Activity  Outcome Measure Difficulty turning over in bed (including adjusting bedclothes, sheets and blankets)?: Total Difficulty moving from lying on back to sitting on the side of the bed? : Total Difficulty sitting down on and standing up from a chair with arms (e.g., wheelchair, bedside commode, etc,.)?: Total Help needed moving to and from a bed to chair (including a wheelchair)?: A Lot Help needed walking in hospital room?: Total Help needed climbing 3-5 steps with a railing? : Total 6 Click Score: 7    End of Session Equipment Utilized During Treatment: Oxygen Activity Tolerance: Patient limited by pain Patient left: in bed;with call bell/phone within reach;with bed alarm set Nurse Communication:  (Patient with pain, requesting cream for bottom) PT Visit Diagnosis: Other abnormalities of gait and mobility (R26.89);Muscle weakness (generalized) (M62.81);History of falling (Z91.81);Pain Pain - part of body:  (Bottom)    Time: 2130-8657 PT Time Calculation (min) (ACUTE ONLY): 15 min   Charges:   PT Evaluation $PT Eval Moderate Complexity: 1 Procedure     PT G Codes:        Durenda Hurt. Renaldo Fiddler, [facility] Acute Rehab Services Pager [phone]   Vena Austria 11/15/2016, 5:29 PM

## 2016-11-15 NOTE — Progress Notes (Signed)
ANTICOAGULATION CONSULT NOTE - Follow-up Consult  Pharmacy Consult for coumadin Indication: atrial fibrillation  Allergies  Allergen Reactions  . Ancef [Cefazolin Sodium] Itching  . Ancef [Cefazolin]     Vital Signs: Temp: 97.9 F (36.6 C) (05/20 0305) Temp Source: Oral (05/20 0305) BP: 93/74 (05/20 0305) Pulse Rate: 88 (05/20 0305)  Labs:  Recent Labs  August 26, 2016 1703 August 26, 2016 1806 August 26, 2016 2310 11/15/16 0614  HGB 17.6*  --   --  16.6*  HCT 61.3*  --   --  58.5*  PLT 66*  --   --  PENDING  LABPROT 26.0*  --   --  29.4*  INR 2.33  --   --  2.72  CREATININE  --  5.99*  --   --   TROPONINI  --   --  0.03*  --     Estimated Creatinine Clearance: 10.6 mL/min (A) (by C-G formula based on SCr of 5.99 mg/dL (H)).   Medical History: Past Medical History:  Diagnosis Date  . Atrial fibrillation (HCC)   . Dialysis patient, noncompliant (HCC)   . HTN (hypertension)   . Hypercholesterolemia   . Polycystic kidney disease   . Supraclinoid carotid artery aneurysm, small     Assessment: 61 yo female here with SOB. She is on coumadin PTA for afib (noted with ESRD) and pharmacy consulted to dose. Known history of thrombocytopenia, although platelets are more low than usual at 66 on 5/19. Hgb elevated at 16.6, no overt s/s bleeding noted.   -INR= 2.72  Home coumadin dose: 2.5mg /day except take 5mg  Wed/Sat (last dose on 5/18)  Goal of Therapy:  INR 2-3 Monitor platelets by anticoagulation protocol: Yes   Plan:  -Coumadin 2.5mg  PO x1 today -Daily PT/INR, CBC as needed -Monitor platelets closely and for s/s bleeding   York CeriseKatherine Cook, PharmD Pharmacy Resident  Pager [phone] 11/15/16 7:37 AM

## 2016-11-16 ENCOUNTER — Inpatient Hospital Stay (HOSPITAL_COMMUNITY): Payer: Medicare Other

## 2016-11-16 DIAGNOSIS — N186 End stage renal disease: Secondary | ICD-10-CM

## 2016-11-16 DIAGNOSIS — R55 Syncope and collapse: Secondary | ICD-10-CM

## 2016-11-16 DIAGNOSIS — Z515 Encounter for palliative care: Secondary | ICD-10-CM

## 2016-11-16 DIAGNOSIS — Z992 Dependence on renal dialysis: Secondary | ICD-10-CM

## 2016-11-16 DIAGNOSIS — Z7189 Other specified counseling: Secondary | ICD-10-CM

## 2016-11-16 DIAGNOSIS — I959 Hypotension, unspecified: Principal | ICD-10-CM

## 2016-11-16 DIAGNOSIS — E7801 Familial hypercholesterolemia: Secondary | ICD-10-CM

## 2016-11-16 LAB — VAS US CAROTID
LCCAPSYS: 74 cm/s
LEFT ECA DIAS: -9 cm/s
LEFT VERTEBRAL DIAS: -10 cm/s
Left CCA dist dias: 16 cm/s
Left CCA dist sys: 89 cm/s
Left CCA prox dias: 16 cm/s
Left ICA dist dias: -14 cm/s
Left ICA dist sys: -44 cm/s
Left ICA prox dias: -11 cm/s
Left ICA prox sys: -49 cm/s
RIGHT ECA DIAS: -13 cm/s
RIGHT VERTEBRAL DIAS: -11 cm/s
Right CCA prox dias: 22 cm/s
Right CCA prox sys: 107 cm/s
Right cca dist sys: -103 cm/s

## 2016-11-16 LAB — CBC
HCT: 57.7 % — ABNORMAL HIGH (ref 36.0–46.0)
HEMOGLOBIN: 15.9 g/dL — AB (ref 12.0–15.0)
MCH: 25.2 pg — AB (ref 26.0–34.0)
MCHC: 27.6 g/dL — ABNORMAL LOW (ref 30.0–36.0)
MCV: 91.6 fL (ref 78.0–100.0)
PLATELETS: 90 10*3/uL — AB (ref 150–400)
RBC: 6.3 MIL/uL — AB (ref 3.87–5.11)
RDW: 19.1 % — AB (ref 11.5–15.5)
WBC: 9.5 10*3/uL (ref 4.0–10.5)

## 2016-11-16 LAB — BLOOD GAS, ARTERIAL
ACID-BASE DEFICIT: 0.6 mmol/L (ref 0.0–2.0)
Bicarbonate: 27 mmol/L (ref 20.0–28.0)
DRAWN BY: 280981
O2 Content: 3 L/min
O2 Saturation: 97.2 %
PATIENT TEMPERATURE: 98.6
PH ART: 7.173 — AB (ref 7.350–7.450)
PO2 ART: 112 mmHg — AB (ref 83.0–108.0)
pCO2 arterial: 76.6 mmHg (ref 32.0–48.0)

## 2016-11-16 LAB — GLUCOSE, CAPILLARY: GLUCOSE-CAPILLARY: 121 mg/dL — AB (ref 65–99)

## 2016-11-16 LAB — HEMOGLOBIN A1C
HEMOGLOBIN A1C: 5.6 % (ref 4.8–5.6)
MEAN PLASMA GLUCOSE: 114 mg/dL

## 2016-11-16 MED ORDER — MORPHINE SULFATE (PF) 4 MG/ML IV SOLN: 2.0000 mg | INTRAVENOUS | Status: DC | PRN

## 2016-11-16 MED ORDER — HALOPERIDOL LACTATE 5 MG/ML IJ SOLN: 0.5000 mg | INTRAMUSCULAR | Status: DC | PRN

## 2016-11-16 MED ORDER — LORAZEPAM 1 MG PO TABS: 1.0000 mg | ORAL_TABLET | ORAL | Status: DC | PRN

## 2016-11-16 MED ORDER — LORAZEPAM 2 MG/ML IJ SOLN: 1.0000 mg | INTRAMUSCULAR | Status: DC | PRN

## 2016-11-16 MED ORDER — MIDODRINE HCL 5 MG PO TABS: 10.0000 mg | ORAL_TABLET | Freq: Three times a day (TID) | ORAL | Status: DC

## 2016-11-16 MED ORDER — HALOPERIDOL LACTATE 2 MG/ML PO CONC
0.5000 mg | ORAL | Status: DC | PRN
  Filled 2016-11-16: qty 0.3

## 2016-11-16 MED ORDER — SODIUM CHLORIDE 0.9 % IV BOLUS (SEPSIS)
500.0000 mL | Freq: Once | INTRAVENOUS | Status: AC
  Administered 2016-11-16: 500 mL via INTRAVENOUS

## 2016-11-16 MED ORDER — WARFARIN SODIUM 5 MG PO TABS: 2.5000 mg | ORAL_TABLET | Freq: Once | ORAL | Status: DC

## 2016-11-16 MED ORDER — LORAZEPAM 2 MG/ML PO CONC: 1.0000 mg | ORAL | Status: DC | PRN

## 2016-11-16 MED ORDER — HALOPERIDOL 0.5 MG PO TABS
0.5000 mg | ORAL_TABLET | ORAL | Status: DC | PRN
  Filled 2016-11-16: qty 1

## 2016-11-16 NOTE — Progress Notes (Signed)
Patient and family asking for bipap mask to be taken off. Removed mask and placed patient on 4L of o2 via nasal canal. WIll continue to monitor.

## 2016-11-16 NOTE — Progress Notes (Signed)
Patients bp 50/30's. Responds to voice but falls right back to sleep. Paged md. Received order to give 500cc bolus is sbp is less than 70 when patient is awake. bp 65/36, giving bolus now. Will continue to monitor.

## 2016-11-16 NOTE — Progress Notes (Signed)
ABG attempted twice via doppler with No success. Rn notified

## 2016-11-16 NOTE — Plan of Care (Signed)
Problem: Education: Goal: Knowledge of [facility] Education information/materials will improve Outcome: Progressing Spoke with family about the dying process, answered questions and gave emotional support

## 2016-11-16 NOTE — Progress Notes (Signed)
[facility] Progress Note  [facility] Rounding Note  Subjective:  Alert lying flat in bed. Responds with one-word answers. Denies HA CP, SOB, N/V Was just started on BIPAP for ABG w/pCO2 of 77.5   Objective Vitals:   11/16/16 0805 11/16/16 0900 11/16/16 1000 11/16/16 1100  BP: (!) 156/144 (!) 130/104 (!) 130/91 (!) 66/40  Pulse: (!) 106 (!) 103 86   Resp: 20 (!) 26 (!) 27 (!) 27  Temp: 97.5 F (36.4 C)     TempSrc: Oral     SpO2: 96%     Weight:      Height:       Physical Exam General: Obese ill-appearing female NAD On BIPAP  Heart: Regular, can't hear murmur Lungs: Ant clear  Abdomen: Soft ND/NT Extremities: Chronic vascular changes/woody edema Dialysis Access: R thigh AVG +bruit     Recent Labs Lab 11/10/16 1410 11/19/2016 1806 11/15/16 0614  NA 136 139 138  K 4.6 4.1 5.4*  CL 96* 97* 98*  CO2 25 26 24   GLUCOSE 83 95 68  BUN 52* 17 21*  CREATININE 10.28* 5.99* 6.52*  CALCIUM 8.6* 8.4* 8.5*   Liver Function Tests:  Recent Labs Lab 11/15/16 1857  AST 33  ALT 19  ALKPHOS 81  BILITOT 1.0  PROT 6.4*  ALBUMIN 2.9*     Recent Labs Lab 11/10/16 1410 11/18/2016 1703 11/15/16 0614 11/16/16 0528  WBC 7.0 9.1 6.7 9.5  HGB 16.0* 17.6* 16.6* 15.9*  HCT 53.4* 61.3* 58.5* 57.7*  MCV 88.1 90.0 90.0 91.6  PLT 99* 66* 51* 90*       Component Value Date/Time   SDES BLOOD RIGHT HAND 11/24/2016 1709   SPECREQUEST  11/04/2016 1709    BOTTLES DRAWN AEROBIC ONLY Blood Culture results may not be optimal due to an inadequate volume of blood received in culture bottles   CULT NO GROWTH 2 DAYS 11/09/2016 1709   REPTSTATUS PENDING 10/29/2016 1709     Recent Labs Lab 11/15/2016 2310 11/15/16 0614 11/15/16 1128 11/15/16 1857  TROPONINI 0.03* 0.03* 0.30* 0.05*     Recent Labs Lab 11/15/16 0817 11/16/16 0806  GLUCAP 79 121*    Lab Results  Component Value Date   INR 2.72 11/15/2016   INR 2.33 11/26/2016   INR 1.9 (A) 11/03/2016     Medications . aspirin  81 mg Oral Daily  . cinacalcet  30 mg Oral QAC breakfast  . [START ON 2016/12/01] digoxin  0.125 mg Oral Once per day on Tue Fri  . fenofibrate  160 mg Oral Daily  . multivitamin  1 tablet Oral QHS  . sevelamer carbonate  2,400 mg Oral TID AC  . sodium chloride flush  3 mL Intravenous Q12H  . Warfarin - Pharmacist Dosing Inpatient   Does not apply q1800   Dialysis Orders:  [facility]   EDW 94.5 HD  [place] 2k,2.25ca  Time 4hr  Heparin11,000.  Access R fem avgg   Hectorol 2 mcg IV/HD   No esa or fe iPTH 869 10/24/16   Summary: 61 y.o.femalew/ESRD-HD (TTS), PCKD, hypertension, hyperlipidemia, diet-controlled diabetes, depression, atrial fibrillation on Coumadin, supraclinoidcarotid artery aneurysm, thrombocytopenia. Went for routine dialysis 5/19, had episode unresponsiveness associated w/hypotension (53/38 on arrival to ED)  and in the dialysis unit she had a syncopal episode, she was at that time found to have a blood pressure of 50/35. She responded somewhat to IV fluids and was then brought to the ER. Only sig finding on studies worsening  R heart fx on ECHO   Assessment/Plan:  1. Syncopal episode on HD/Hypotension - new issue.   Initially mproved with fluids. Felt was 2/2 hypotension. Blood cultures neg to date. ECHO with worsening changes c/w right sided HF. Hypotension felt multifactorial. However if this is a R HF issue, may not see any improvement. BP's running 60's mostly today. Not getting midodrine at all right now. Will start. 2. Resp failure - Pulm eval - felt OHV/profound deconditioning. Just started on BIPAP  3. ESRD - TTS -  tentative plan for HD tomorrow if BP stable - no UF  4. Anemia - Hgb 15.9 no esa needed  5. MBD- Cont VDRA/Sensipar - follow renal panel 6. Nutrition - Renal diet/protsat for low albumin  7. A. Fib - on Coumadin /digoxin /holding metoprolol with hypotension 8. Depression - on Zoloft at home  9. 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 DM - per primary  11. Goals of care - Bed bound now .  DNR Unclear if patient will remain suitable candidate for HD with severe hypotension/multiple comorbidities. Missed 2 HD tmts PTA d/t weakness. Sister and nephew report sig decline in last 6 months, missing TMT's, not taking medicines. She (pt) and family understand that hypotension may be limiting factor to continuing HD. Will reassess in the AM. Palliative care consult pending   Ogechi Susann Givens PA-C [facility] Pager [phone] 11/16/2016,11:19 AM  LOS: 2 days    I have seen and examined this patient and agree with plan and assessment in the above note with renal recommendations/intervention highlighted. Somehow appears midodrine never got started so have ordered. On BIPAP now for severe resp acidosis. BP's running 60's mostly. Reassess for HD in the AM and if BP improved attempt HD tomorrow. If too low for HD (or if try HD and unable to tolerate) then will have to pursue comfort care more aggressively. Pt aware and understands.  Zackory Pudlo B,MD 11/16/2016 12:38 PM

## 2016-11-16 NOTE — Progress Notes (Signed)
@IPLOG         PROGRESS NOTE                                                                                                                                                                                                             Patient Demographics:    [name] Bender, is a 61 y.o. female, DOB - 1955-08-05, ZOX:[id number]  Admit date - 2016/12/11   Admitting Physician Lorretta Harp, MD  Outpatient Primary MD for the patient is Primitivo Gauze, MD  LOS - 2  Chief Complaint  Patient presents with  . Shortness of Breath       Brief Narrative - [name] Bender is a 61 y.o. female with medical history significant of ESRD-HD (TTS), PCK, hypertension, hyperlipidemia, diet-controlled diabetes, depression, atrial fibrillation on Coumadin, Spraclinoid carotid artery aneurysm, thrombocytopenia, she went for her routine dialysis and in the dialysis unit she had a syncopal episode, she was at that time found to have a blood pressure of 50/35. She responded somewhat to IV fluids and was then brought to the ER.   Subjective:   Patient in bed, appears comfortable, denies any headache, no fever, no chest pain or pressure, no shortness of breath , no abdominal pain. No focal weakness.   Assessment  & Plan :     1.Syncope. This is due to extreme hypotension during the dialysis, blood pressure systolic was close to 50s, although patient has history of low blood pressure but this was much lower than her usual baseline. She has been hydrated with improvement, now likely close to her baseline mentation, CT head was unremarkable except for Colloid cyst as below, continue to monitor closely. She had no evidence of infection or sepsis with stable lactate and pro-calcitonin. Appears nontoxic as well.   2. ESRD. On TTS schedule and nephrology following.  3. Dyslipidemia. Continue on fenofibrate.  4. Chronic atrial fibrillation [place] vasc 2 score of 3. On Coumadin and digoxin, is also on Lopressor and will  titrate the dose up as needed based on blood pressure tolerance.  5. Depression. On Zoloft and stable.  6. Hypoxic respiratory failure acute which was noted in the ER. Currently resolved. This likely was due to severe hypotension and hypoperfusion. Monitor.  7. Chronic thrombocytopenia. No acute issues PCP to monitor.  8. Mild Hypercapnic Resp failure noted on ABG (likely due to Syncope and encephalopathy episode )-  was given BiPAP for a few hours, will repeat ABG and monitor.   9. Colloid cyst. Chronic and stable. Seen by  neurosurgery, supportive care only at this time, per neurosurgery hydrocephalus is not impressive and not causing any symptoms per se.  10. Hypotension in the hospital after hydration. Likely due to wrong cuff placement while in the hospital, cuff was replaced blood pressure 150/100. Monitor.  11. Multiple comorbidities, generalized deconditioning and extreme weakness. Patient wished to be DO NOT RESUSCITATE with pulmonary consultation yesterday, wants to continue general medical treatment of declines wants to focus on comfort care, will also involve palliative care for goals of care and future plan.    Diet : Diet renal with fluid restriction Fluid restriction: 1200 mL Fluid; Room service appropriate? Yes; Fluid consistency: Thin    Family Communication  :  None  Code Status :  Full  Disposition Plan  :  TBD, likely will require SNF  Consults  : Nephrology, neurosurgery, pulmonary critical care, palliative care  Procedures  :    CT head with choroid cyst and hydrocephalus.  Echocardiogram done 6 months ago - Normal LV systolic function; severe LAE; mild RAE; small density in posterior pericardial space likely represents epicardial fat; mild TR with moderate to severe elevation in pulmonary pressure; moderate posterior and lateral pericardial effusion.  DVT Prophylaxis  :   Coumadin  Lab Results  Component Value Date   INR 2.72 11/15/2016   INR 2.33  11/26/2016   INR 1.9 (A) 11/03/2016    Lab Results  Component Value Date   PLT 90 (L) 11/16/2016    Inpatient Medications  Scheduled Meds: . aspirin  81 mg Oral Daily  . cinacalcet  30 mg Oral QAC breakfast  . [START ON Nov 21, 2016] digoxin  0.125 mg Oral Once per day on Tue Fri  . fenofibrate  160 mg Oral Daily  . multivitamin  1 tablet Oral QHS  . sevelamer carbonate  2,400 mg Oral TID AC  . sodium chloride flush  3 mL Intravenous Q12H  . Warfarin - Pharmacist Dosing Inpatient   Does not apply q1800   Continuous Infusions:  PRN Meds:.acetaminophen **OR** acetaminophen, albuterol, [DISCONTINUED] ondansetron **OR** ondansetron (ZOFRAN) IV  Antibiotics  :    Anti-infectives    None         Objective:   Vitals:   11/16/16 0245 11/16/16 0532 11/16/16 0732 11/16/16 0805  BP: (!) 71/23 90/77 (!) 65/36 (!) 156/144  Pulse: 81  (!) 55 (!) 106  Resp: 13  19 20   Temp: 97.4 F (36.3 C)   97.5 F (36.4 C)  TempSrc: Axillary   Oral  SpO2: 96% 94%  96%  Weight:  99.3 kg (218 lb 14.7 oz)    Height:        Wt Readings from Last 3 Encounters:  11/16/16 99.3 kg (218 lb 14.7 oz)  11/10/16 89.8 kg (198 lb)  03/11/16 95.3 kg (210 lb)     Intake/Output Summary (Last 24 hours) at 11/16/16 0851 Last data filed at 11/16/16 0817  Gross per 24 hour  Intake              503 ml  Output                0 ml  Net              503 ml     Physical Exam  Mildly somnolent but wakes up, answers all questions and follows all commands, No new F.N deficits, Normal affect [place].AT,PERRAL Supple Neck,No JVD, No cervical lymphadenopathy appriciated.  Symmetrical Chest wall movement, Good air  movement bilaterally, CTAB RRR,No Gallops,Rubs or new Murmurs, No Parasternal Heave +ve B.Sounds, Abd Soft, No tenderness, No organomegaly appriciated, No rebound - guarding or rigidity. Large ventral abdomina hernia nonn No Cyanosis, Clubbing or edema, No new Rash or bruise       Data Review:     CBC  Recent Labs Lab 11/10/16 1410 2016/07/16 1703 11/15/16 0614 11/16/16 0528  WBC 7.0 9.1 6.7 9.5  HGB 16.0* 17.6* 16.6* 15.9*  HCT 53.4* 61.3* 58.5* 57.7*  PLT 99* 66* 51* 90*  MCV 88.1 90.0 90.0 91.6  MCH 26.4 25.8* 25.5* 25.2*  MCHC 30.0 28.7* 28.4* 27.6*  RDW 18.4* 19.1* 18.8* 19.1*    Chemistries   Recent Labs Lab 11/10/16 1410 2016/07/16 1806 11/15/16 0614 11/15/16 1857  NA 136 139 138  --   K 4.6 4.1 5.4*  --   CL 96* 97* 98*  --   CO2 25 26 24   --   GLUCOSE 83 95 68  --   BUN 52* 17 21*  --   CREATININE 10.28* 5.99* 6.52*  --   CALCIUM 8.6* 8.4* 8.5*  --   AST  --   --   --  33  ALT  --   --   --  19  ALKPHOS  --   --   --  81  BILITOT  --   --   --  1.0   ------------------------------------------------------------------------------------------------------------------  Recent Labs  11/15/16 0614  CHOL 113  HDL 39*  LDLCALC 49  TRIG [id number]  CHOLHDL 2.9    Lab Results  Component Value Date   HGBA1C 5.6 11/15/2016   ------------------------------------------------------------------------------------------------------------------  Recent Labs  11/15/16 1128  TSH 2.760   ------------------------------------------------------------------------------------------------------------------ No results for input(s): VITAMINB12, FOLATE, FERRITIN, TIBC, IRON, RETICCTPCT in the last 72 hours.  Coagulation profile  Recent Labs Lab 2016/07/16 1703 11/15/16 0614  INR 2.33 2.72    No results for input(s): DDIMER in the last 72 hours.  Cardiac Enzymes  Recent Labs Lab 11/15/16 [id number] 11/15/16 1128 11/15/16 1857  TROPONINI 0.03* 0.30* 0.05*   ------------------------------------------------------------------------------------------------------------------ No results found for: BNP  Micro Results Recent Results (from the past 240 hour(s))  Culture, blood (routine x 2)     Status: None (Preliminary result)   Collection Time: 2016/07/16  5:00 PM   Result Value Ref Range Status   Specimen Description BLOOD RIGHT UPPER ARM  Final   Special Requests IN PEDIATRIC BOTTLE Blood Culture adequate volume  Final   Culture NO GROWTH < 24 HOURS  Final   Report Status PENDING  Incomplete  Culture, blood (routine x 2)     Status: None (Preliminary result)   Collection Time: 2016/07/16  5:09 PM  Result Value Ref Range Status   Specimen Description BLOOD RIGHT HAND  Final   Special Requests   Final    BOTTLES DRAWN AEROBIC ONLY Blood Culture results may not be optimal due to an inadequate volume of blood received in culture bottles   Culture NO GROWTH < 24 HOURS  Final   Report Status PENDING  Incomplete  MRSA PCR Screening     Status: None   Collection Time: 11/15/16 12:18 AM  Result Value Ref Range Status   MRSA by PCR NEGATIVE NEGATIVE Final    Comment:        The GeneXpert MRSA Assay (FDA approved for NASAL specimens only), is one component of a comprehensive MRSA colonization surveillance program. It is not intended to diagnose MRSA infection  nor to guide or monitor treatment for MRSA infections.     Radiology Reports Dg Chest 2 View  Result Date: 11/10/2016 CLINICAL DATA:  Weakness.  Missed dialysis. EXAM: CHEST  2 VIEW COMPARISON:  05/16/2010 FINDINGS: Cardiomegaly accentuated by low lung volumes. Aortic atherosclerosis and vascular pedicle widening. Mild atelectasis seen in lateral projection as anterior streaky densities. There is no edema, air bronchogram, effusion, or pneumothorax. Exaggerated thoracic kyphosis. Limited visualization of vertebral bodies due to soft tissue attenuation. IMPRESSION: 1. Low lung volumes and mild atelectasis. Negative for edema or definitive pneumonia. 2. Cardiomegaly. Electronically Signed   By: Marnee Spring M.D.   On: 11/10/2016 13:13   Ct Head Wo Contrast  Result Date: 2016-12-07 CLINICAL DATA:  Syncope and unresponsiveness. EXAM: CT HEAD WITHOUT CONTRAST TECHNIQUE: Contiguous axial images  were obtained from the base of the skull through the vertex without intravenous contrast. COMPARISON:  Report from outside brain MRI dated 06/19/2013, images unavailable for review at the time of dictation. FINDINGS: Brain: There is a hyperdensity at the foramina of [place]ro measuring 17 mm AP x 16 mm transverse. According to the outside MRI report, the lesion measures 17 mm AP previously. There is moderate hydrocephalus with mild periventricular hypoattenuation. No hemorrhage. No extra-axial collection. 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 Vascular: By report, the patient has a right MCA bifurcation aneurysm. This is not visible on this study. The vessels are otherwise unremarkable aside from atherosclerotic calcification at the skullbase. Skull: Normal visualized skull base, calvarium and extracranial soft tissues. Sinuses/Orbits: No sinus fluid levels or advanced mucosal thickening. No mastoid effusion. Normal orbits. IMPRESSION: 1. 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 The AP dimension is unchanged according to the outside MRI report of 06/19/2013. Without comparison images, it is impossible to know the acuity of the hydrocephalus 2. Periventricular hypoattenuation is suggestive of chronic microvascular ischemia. However, in the setting of hydrocephalus, a component of transependymal interstitial edema would be difficult to exclude. 3. Neurosurgical consultation may be appropriate, as colloid cysts may increase in size over time pain can be a source acute obstructive hydrocephalus. Electronically Signed   By: Deatra Robinson M.D.   On: 2016-12-07 22:59   Dg Chest Port 1 View  Result Date: December 07, 2016 CLINICAL DATA:  Shortness of breath during dialysis therapy EXAM: PORTABLE CHEST 1 VIEW COMPARISON:  11/10/2016 FINDINGS: Cardiac shadow is again enlarged. Aortic calcifications are again seen. Right lung is well aerated without focal infiltrate. Enlarging left-sided pleural  effusion and likely underlying atelectasis is seen. No bony abnormality is noted. IMPRESSION: Enlarging left basilar pleural effusion and likely atelectasis. Electronically Signed   By: Alcide Clever M.D.   On: Dec 07, 2016 18:51    Time Spent in minutes  30   Susa Raring M.D on 11/16/2016 at 8:51 AM  Between 7am to 7pm - Pager - [phone] ( page via [email or web address], text pages only, please mention full 10 digit call back number). After 7pm go to [email or web address] - password [email or web address]

## 2016-11-16 NOTE — Progress Notes (Signed)
   Met w/ patient and family at bedside.  Introduced Art gallery manager.  Available for follow-up, as needed.  - Rev. [place] MDiv ThM

## 2016-11-16 NOTE — Progress Notes (Signed)
**  Preliminary report by tech**  Carotid artery duplex complete. Findings consistent with less than 39 percent stenosis involving the right internal carotid artery and the left internal carotid artery. The vertebral arteries demonstrate antegrade flow.  11/16/16 10:15 AM Olen CordialGreg Azyiah Bo RVT

## 2016-11-16 NOTE — Progress Notes (Signed)
ANTICOAGULATION CONSULT NOTE - Follow-up Consult  Pharmacy Consult for Coumadin Indication: atrial fibrillation  Allergies  Allergen Reactions  . Ancef [Cefazolin Sodium] Itching  . Ancef [Cefazolin]     Vital Signs: Temp: 96.6 F (35.9 C) (05/21 1132) Temp Source: Oral (05/21 1132) BP: 69/47 (05/21 1132) Pulse Rate: 82 (05/21 1132)  Labs:  Recent Labs  11/26/2016 1703 11/23/2016 1806  11/15/16 0614 11/15/16 1128 11/15/16 1857 11/16/16 0528  HGB 17.6*  --   --  16.6*  --   --  15.9*  HCT 61.3*  --   --  58.5*  --   --  57.7*  PLT 66*  --   --  51*  --   --  90*  LABPROT 26.0*  --   --  29.4*  --   --   --   INR 2.33  --   --  2.72  --   --   --   CREATININE  --  5.99*  --  6.52*  --   --   --   TROPONINI  --   --   < > 0.03* 0.30* 0.05*  --   < > = values in this interval not displayed.  Estimated Creatinine Clearance: 9.9 mL/min (A) (by C-G formula based on SCr of 6.52 mg/dL (H)).   Assessment: 61 yo female here with SOB. She is on coumadin PTA for afib (noted with ESRD) and pharmacy consulted to dose.  -INR= 2.72  Home coumadin dose: 2.5mg /day except take 5mg  Wed/Sat (last dose on 5/18)  Goal of Therapy:  INR 2-3 Monitor platelets by anticoagulation protocol: Yes   Plan:  -Repeat Coumadin 2.5mg  PO x1 today -Daily PT/INR, CBC as needed -Monitor platelets closely and for s/s bleeding   Thank you Okey RegalLisa Keighley Deckman, PharmD [phone] 11/16/16 12:41 PM

## 2016-11-16 NOTE — Consult Note (Signed)
Consultation Note Date: 11/16/2016   Patient Name: [name] Bender  DOB: 07-11-1955  MRN: [id number]  Age / Sex: 61 y.o., female  PCP: Fleet Contras, MD Referring Physician: Thurnell Lose, MD  Reason for Consultation: Establishing goals of care and Terminal Care  HPI/Patient Profile: 61 y.o. female  with past medical history of ESRD (on HD), polycystic kidney disease, HTN, DM, depression, Afib, carotid artery aneurysm, thrombocytopenia admitted on 11/09/2016 after being unresponsive during dialysis. She became SOB and hypotensive during dialysis and required fluid resuscitation. She has remained hypotensive during admission, and has developed respiratory distress requiring Bipap. 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 Per PCCM and primary decision was made not to escalate care. Palliative medicine consulted for further [place].   Clinical Assessment and Goals of Care: Met with patient. She was awake, eyes closed, on Bipap. She nodded yes and no answers appropriately. Explained her medical situation and likely poor prognosis. Asked her preference re: continued aggressive measures including attempts to maintain BP and attempt dialysis vs transition to comfort care and allow natural death. Patient clearly expressed option of transition to comfort measures. Given her multiple comorbidities, highly deconditioned state, and poor likelihood of returning to functional status this is reasonable action. Discussed her wishes with family- her son- [name] and sisterPrecious Bard, who witnessed my conversation with patient. Described what comfort measures would entail- discontinuing medications considered life prolonging, giving medications focused on comfort. Transition from Bipap to [place] when SOB better controlled with  opioids. Discussed that patient's lifetime may be limited to hours to a day once transition to comfort care is made. Family verbalized understanding. They agree that this would be patient's wishes.      Primary Decision Maker NEXT OF KIN - patient's son- [name]    SUMMARY OF RECOMMENDATIONS -Transition to full comfort care- -Comfort orders as entered - Attempt to wean patient off Bipap using opioids and benzos for symptom management -If patient survives weaning off Bipap will discuss possible residential hospice, however, I am concerned she would not survive transfer. - Do not check sats. Do not titrate O2 up. Give benzos and/or opioids for shortness of breath, dyspnea, increased work of breathing or respiratory rate over 22.   Code Status/Advance Care Planning:  DNR    Symptom Management:   As above  Palliative Prophylaxis:   Frequent Pain Assessment  Additional Recommendations (Limitations, Scope, Preferences):  Full Comfort Care  Prognosis:    Hours - Days  Discharge Planning: Anticipated Hospital Death  Primary Diagnoses: Present on Admission: . Unresponsiveness . Essential hypertension . ATRIAL FIBRILLATION . Depression . Acute respiratory failure with hypoxia ([place]) . Syncope . Hypotension . HLD (hyperlipidemia) . Thrombocytopenia ([place])   I have reviewed the medical record, interviewed the patient and family, and examined the patient. The following aspects are pertinent.  Past Medical History:  Diagnosis Date  . Atrial fibrillation ([place])   . Dialysis patient, noncompliant ([place])   .  HTN (hypertension)   . Hypercholesterolemia   . Polycystic kidney disease   . Supraclinoid carotid artery aneurysm, small    Social History   Social History  . Marital status: Married    Spouse name: N/A  . Number of children: 2  . Years of education: N/A   Social History Main Topics  . Smoking status: Never Smoker  . Smokeless tobacco: Never Used  . Alcohol  use Yes     Comment: occasional  . Drug use: No  . Sexual activity: Not Asked   Other Topics Concern  . None   Social History Narrative   As of May 2018 - Grew up in [place], went to [employer] through 12th grade.  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  Lived in [place].  Has polycystic kidney dz, started hemodialysis in 12-Sep-2002, went on disability.  Husband worked as a Furniture conservator/restorer, he died in 09/13/15 in his 13's w/ heart failure.  One of her sons died also in 09/13/2015 from drug overdose. She lives alone in a house now in [facility] and gets help from her other son who lives a few miles away.  Drives herself to dialysis in [place].  Does dialysis T-T-S.  In sparetime likes to visit with her granddaughter.     Family History  Problem Relation Age of Onset  . Atrial fibrillation Father   . Stroke Mother    Scheduled Meds: . aspirin  81 mg Oral Daily  . cinacalcet  30 mg Oral QAC breakfast  . [START ON 2016/12/06] digoxin  0.125 mg Oral Once per day on Tue Fri  . fenofibrate  160 mg Oral Daily  . midodrine  10 mg Oral TID WC  . multivitamin  1 tablet Oral QHS  . sevelamer carbonate  2,400 mg Oral TID AC  . sodium chloride flush  3 mL Intravenous Q12H  . warfarin  2.5 mg Oral ONCE-1800  . Warfarin - Pharmacist Dosing Inpatient   Does not apply q1800   Continuous Infusions: PRN Meds:.acetaminophen **OR** acetaminophen, albuterol, [DISCONTINUED] ondansetron **OR** ondansetron (ZOFRAN) IV Medications Prior to Admission:  Prior to Admission medications   Medication Sig Start Date End Date Taking? Authorizing Provider  b complex-vitamin c-folic acid (NEPHRO-VITE) 0.8 MG TABS tablet Take 1 tablet by mouth at bedtime.   Yes [provider]  cinacalcet (SENSIPAR) 30 MG tablet Take 30 mg by mouth daily.     Yes [provider]  digoxin (LANOXIN) 0.125 MG tablet TAKE 1 TABLET BY MOUTH TWO TIMES A WEEK ON TUESDAY & FRIDAY 10/12/16  Yes Lelon Perla, MD    fenofibrate (TRICOR) 145 MG tablet Take 145 mg by mouth daily.     Yes [provider]  metoprolol (LOPRESSOR) 100 MG tablet Take 1 tablet (100 mg total) by mouth daily. 03/30/16  Yes Lelon Perla, MD  sertraline (ZOLOFT) 50 MG tablet Take 50 mg by mouth daily.   Yes [provider]  sevelamer (RENVELA) 800 MG tablet Take 2,400 mg by mouth 3 (three) times daily before meals.    Yes [provider]  warfarin (COUMADIN) 5 MG tablet Take 2.5-5 mg by mouth See admin instructions. Take 1 tablet on Wednesday and Saturday then take 1/2 tablet all the other days   Yes [provider]   Allergies  Allergen Reactions  . Ancef [Cefazolin Sodium] Itching  . Ancef [Cefazolin]    Review of Systems  Unable to perform ROS:  Acuity of condition    Physical Exam  Constitutional: No distress.  Cardiovascular:  Feet cold, no pedal pulses, toes purple, diffuse anasarca  Pulmonary/Chest:  On Bipap  Neurological:  Awake, drowsy  Skin:  erythema on BLE  Nursing note and vitals reviewed.   Vital Signs: BP (!) 69/47 (BP Location: Left Arm)   Pulse (!) 32   Temp (!) 96.6 F (35.9 C) (Oral)   Resp (!) 27   Ht 5' 2"  (1.575 m)   Wt 99.3 kg (218 lb 14.7 oz)   SpO2 (!) 73%   BMI 40.04 kg/m  Pain Assessment: No/denies pain POSS *See Group Information*: 1-Acceptable,Awake and alert     SpO2: SpO2: (!) 73 % O2 Device:SpO2: (!) 73 % O2 Flow Rate: .O2 Flow Rate (L/min): 3 L/min  IO: Intake/output summary:  Intake/Output Summary (Last 24 hours) at 11/16/16 1339 Last data filed at 11/16/16 0165  Gross per 24 hour  Intake              503 ml  Output                0 ml  Net              503 ml    LBM: Last BM Date: 11/15/16 Baseline Weight: Weight: 97.3 kg (214 lb 8.1 oz) Most recent weight: Weight: 99.3 kg (218 lb 14.7 oz)     Palliative Assessment/Data:PPS: 10%   Flowsheet Rows     Most Recent Value  Intake Tab  Referral Department  Hospitalist   Unit at Time of Referral  Intermediate Care Unit  Palliative Care Primary Diagnosis  Nephrology  Date Notified  11/16/16  Palliative Care Type  New Palliative care  Reason for referral  Clarify Goals of Care  Date of Admission  11/20/2016  # of days IP prior to Palliative referral  2  Clinical Assessment  Psychosocial & Spiritual Assessment  Palliative Care Outcomes      Thank you for this consult. Palliative medicine will continue to follow and assist as needed.   Time In: 1230 Time Out: 1345 Time Total: 75 minutes Greater than 50%  of this time was spent counseling and coordinating care related to the above assessment and plan.  Signed by: Mariana Kaufman, AGNP-C Palliative Medicine    Please contact Palliative Medicine Team phone at [phone] for questions and concerns.  For individual provider: See Shea Evans

## 2016-11-18 LAB — BLOOD CULTURE ID PANEL (REFLEXED)
ACINETOBACTER BAUMANNII: NOT DETECTED
CANDIDA ALBICANS: NOT DETECTED
CANDIDA PARAPSILOSIS: NOT DETECTED
CANDIDA TROPICALIS: NOT DETECTED
Candida glabrata: NOT DETECTED
Candida krusei: NOT DETECTED
ENTEROBACTERIACEAE SPECIES: NOT DETECTED
Enterobacter cloacae complex: NOT DETECTED
Enterococcus species: NOT DETECTED
Escherichia coli: NOT DETECTED
HAEMOPHILUS INFLUENZAE: NOT DETECTED
KLEBSIELLA OXYTOCA: NOT DETECTED
KLEBSIELLA PNEUMONIAE: NOT DETECTED
Listeria monocytogenes: NOT DETECTED
Neisseria meningitidis: NOT DETECTED
PROTEUS SPECIES: NOT DETECTED
Pseudomonas aeruginosa: NOT DETECTED
STREPTOCOCCUS SPECIES: NOT DETECTED
Serratia marcescens: NOT DETECTED
Staphylococcus aureus (BCID): NOT DETECTED
Staphylococcus species: NOT DETECTED
Streptococcus agalactiae: NOT DETECTED
Streptococcus pneumoniae: NOT DETECTED
Streptococcus pyogenes: NOT DETECTED

## 2016-11-19 LAB — CULTURE, BLOOD (ROUTINE X 2)
CULTURE: NO GROWTH
Special Requests: ADEQUATE

## 2016-11-20 LAB — CULTURE, BLOOD (ROUTINE X 2)

## 2016-11-27 NOTE — Progress Notes (Signed)
28-Nov-2016 0930  Clinical Encounter Type  Visited With Patient and family together  Visit Type Initial;Spiritual support;Death  Referral From Patient;Family  Consult/Referral To Chaplain  Spiritual Encounters  Spiritual Needs Sacred text;Prayer;Emotional;Grief support  Stress Factors  Patient Stress Factors Loss  Family Stress Factors Loss  Pt death, family requested prayer.  Offered prayer, both the Lord's Prayer and Psalm 23, emotional support, active listening, and ministry of presence.  Chaplain Zhane Bluitt A.Kennedy Bucker , MA-PC, [phone]

## 2016-11-27 NOTE — Progress Notes (Signed)
Pt is no longer responding to me respirations are somewhat more ragged called son to make him aware will continue to monitor

## 2016-11-27 NOTE — Progress Notes (Signed)
Pt expired at 0655 verified by my self and [place] RN with no heart beat and no respirations.  Dr Leana RoeP Signh notified of death Her son Elray BubaJeremy Trachtenberg called to notify of death. [place] donor called for referral [id number]. Day shift rn made aware of all above and will complete postmortem care

## 2016-11-27 NOTE — Discharge Summary (Signed)
[facility] Death Note                                                                                                                                                                                               [name] Bender:[id number] DOB: 02-Jan-1956 DOA: 11/30/2016  PCP: Primitivo Gauze, MD  Admit date: 11/30/16  Date of Death: Dec 03, 2016     Time of Death: 06:55  Pronounced By - RN  Notification: Primitivo Gauze, MD notified of death of 12-03-2016   History of present illness:   [name] Bender is a 61 y.o. female with a history of - [name] [name] 61 y.o.femalewith medical history significant of ESRD-HD (TTS), PCK, hypertension, hyperlipidemia, diet-controlled diabetes, depression, atrial fibrillation on Coumadin, Spraclinoidcarotid artery aneurysm, thrombocytopenia, she went for her routine dialysis and in the dialysis unit she had a syncopal episode, she was at that time found to have a blood pressure of 50/35. She responded somewhat to IV fluids and was then brought to the ER, she was seen by Renal, PCCM and Pall Care, per family was doing poorly for several months with poor quality of life, she was made full comfort care yesterday by Palliative Care.   Final Diagnoses:  Cause if death - Multi Organ failure with ESRD, Hypotension, Dehydration, Afib  Signature  Susa Raring M.D on 12-03-16 at 7:18 AM  [facility]  Office Phone [phone]  Total clinical and documentation time for today Under 30 minutes   Last Note      @IPLOG         PROGRESS NOTE  Patient Demographics:    [name] Bender, is a 61 y.o. female, DOB - 1956-04-11, ZOX:[id number]  Admit date -  11/11/2016   Admitting Physician Lorretta Harp, MD  Outpatient Primary MD for the patient is Primitivo Gauze, MD  LOS - 3  Chief Complaint  Patient presents with  . Shortness of Breath       Brief Narrative - [name] Bender is a 61 y.o. female with medical history significant of ESRD-HD (TTS), PCK, hypertension, hyperlipidemia, diet-controlled diabetes, depression, atrial fibrillation on Coumadin, Spraclinoid carotid artery aneurysm, thrombocytopenia, she went for her routine dialysis and in the dialysis unit she had a syncopal episode, she was at that time found to have a blood pressure of 50/35. She responded somewhat to IV fluids and was then brought to the ER.   Subjective:   Patient in bed, appears comfortable, denies any headache, no fever, no chest pain or pressure, no shortness of breath , no abdominal pain. No focal weakness.   Assessment  & Plan :     1.Syncope. This is due to extreme hypotension during the dialysis, blood pressure systolic was close to 50s, although patient has history of low blood pressure but this was much lower than her usual baseline. She has been hydrated with improvement, now likely close to her baseline mentation, CT head was unremarkable except for Colloid cyst as below, continue to monitor closely. She had no evidence of infection or sepsis with stable lactate and pro-calcitonin. Appears nontoxic as well.   2. ESRD. On TTS schedule and nephrology following.  3. Dyslipidemia. Continue on fenofibrate.  4. Chronic atrial fibrillation [place] vasc 2 score of 3. On Coumadin and digoxin, is also on Lopressor and will titrate the dose up as needed based on blood pressure tolerance.  5. Depression. On Zoloft and stable.  6. Hypoxic respiratory failure acute which was noted in the ER. Currently resolved. This likely was due to severe hypotension and hypoperfusion. Monitor.  7. Chronic thrombocytopenia. No acute issues PCP to monitor.  8. Mild Hypercapnic Resp  failure noted on ABG (likely due to Syncope and encephalopathy episode )-  was given BiPAP for a few hours, will repeat ABG and monitor.   9. Colloid cyst. Chronic and stable. Seen by neurosurgery, supportive care only at this time, per neurosurgery hydrocephalus is not impressive and not causing any symptoms per se.  10. Hypotension in the hospital after hydration. Likely due to wrong cuff placement while in the hospital, cuff was replaced blood pressure 150/100. Monitor.  11. Multiple comorbidities, generalized deconditioning and extreme weakness. Patient wished to be DO NOT RESUSCITATE with pulmonary consultation yesterday, wants to continue general medical treatment of declines wants to focus on comfort care, will also involve palliative care for goals of care and future plan.    Diet : Diet renal with fluid restriction Fluid restriction: 1200 mL Fluid; Room service appropriate? Yes; Fluid consistency: Thin    Family Communication  :  None  Code Status :  Full  Disposition Plan  :  TBD, likely will require SNF  Consults  : Nephrology, neurosurgery, pulmonary critical care, palliative care  Procedures  :    CT head with choroid cyst and hydrocephalus.  Echocardiogram done 6 months ago - Normal LV systolic function; severe LAE; mild RAE; small density in posterior pericardial space likely represents epicardial fat; mild TR with moderate to severe elevation in pulmonary pressure; moderate posterior and lateral pericardial effusion.  DVT Prophylaxis  :   Coumadin  Lab Results  Component Value Date   INR 2.72 11/15/2016   INR 2.33 11/09/2016   INR 1.9 (A) 11/03/2016    Lab Results  Component Value Date   PLT 90 (L) 11/16/2016    Inpatient Medications  Scheduled Meds:  Continuous Infusions:  PRN Meds:.acetaminophen **OR** acetaminophen, haloperidol **OR** haloperidol **OR** haloperidol lactate, LORazepam **OR** LORazepam **OR** LORazepam, morphine injection, [DISCONTINUED]  ondansetron **OR** ondansetron (ZOFRAN) IV  Antibiotics  :    Anti-infectives    None         Objective:   Vitals:   11/16/16 1200 11/16/16 1242 11/16/16 1415 11/16/16 1630  BP: (!) 77/64     Pulse: (!) 30 (!) 32    Resp: 19 (!) 27    Temp:      TempSrc:      SpO2:  (!) 73% 97% 91%  Weight:      Height:        Wt Readings from Last 3 Encounters:  11/16/16 99.3 kg (218 lb 14.7 oz)  11/10/16 89.8 kg (198 lb)  03/11/16 95.3 kg (210 lb)     Intake/Output Summary (Last 24 hours) at 2016-12-17 0718 Last data filed at 11/16/16 0817  Gross per 24 hour  Intake                3 ml  Output                0 ml  Net                3 ml     Physical Exam  Mildly somnolent but wakes up, answers all questions and follows all commands, No new F.N deficits, Normal affect [place].AT,PERRAL Supple Neck,No JVD, No cervical lymphadenopathy appriciated.  Symmetrical Chest wall movement, Good air movement bilaterally, CTAB RRR,No Gallops,Rubs or new Murmurs, No Parasternal Heave +ve B.Sounds, Abd Soft, No tenderness, No organomegaly appriciated, No rebound - guarding or rigidity. Large ventral abdomina hernia nonn No Cyanosis, Clubbing or edema, No new Rash or bruise       Data Review:    CBC  Recent Labs Lab 11/10/16 1410 11/22/2016 1703 11/15/16 0614 11/16/16 0528  WBC 7.0 9.1 6.7 9.5  HGB 16.0* 17.6* 16.6* 15.9*  HCT 53.4* 61.3* 58.5* 57.7*  PLT 99* 66* 51* 90*  MCV 88.1 90.0 90.0 91.6  MCH 26.4 25.8* 25.5* 25.2*  MCHC 30.0 28.7* 28.4* 27.6*  RDW 18.4* 19.1* 18.8* 19.1*    Chemistries   Recent Labs Lab 11/10/16 1410 11/02/2016 1806 11/15/16 0614 11/15/16 1857  NA 136 139 138  --   K 4.6 4.1 5.4*  --   CL 96* 97* 98*  --   CO2 25 26 24   --   GLUCOSE 83 95 68  --   BUN 52* 17 21*  --   CREATININE 10.28* 5.99* 6.52*  --   CALCIUM 8.6* 8.4* 8.5*  --   AST  --   --   --  33  ALT  --   --   --  19  ALKPHOS  --   --   --  81  BILITOT  --   --   --  1.0    ------------------------------------------------------------------------------------------------------------------  Recent Labs  11/15/16 0614  CHOL 113  HDL 39*  LDLCALC 49  TRIG 161  CHOLHDL 2.9    Lab Results  Component Value Date   HGBA1C 5.6 11/15/2016   ------------------------------------------------------------------------------------------------------------------  Recent Labs  11/15/16 1128  TSH 2.760   ------------------------------------------------------------------------------------------------------------------ No results for input(s): VITAMINB12, FOLATE, FERRITIN, TIBC, IRON, RETICCTPCT in the last 72 hours.  Coagulation profile  Recent Labs Lab 11/02/2016 1703 11/15/16 0614  INR 2.33 2.72    No results for input(s): DDIMER in the last 72 hours.  Cardiac Enzymes  Recent Labs Lab 11/15/16 [id number] 11/15/16 1128 11/15/16 1857  TROPONINI 0.03* 0.30* 0.05*   ------------------------------------------------------------------------------------------------------------------ No results found for: BNP  Micro Results Recent Results (from the past 240 hour(s))  Culture, blood (routine x 2)     Status: None (Preliminary result)   Collection Time: 11/09/2016  5:00 PM  Result Value Ref Range Status   Specimen Description BLOOD RIGHT UPPER ARM  Final   Special Requests IN PEDIATRIC BOTTLE Blood Culture adequate volume  Final   Culture NO GROWTH 2 DAYS  Final   Report Status PENDING  Incomplete  Culture, blood (routine x 2)     Status: None (Preliminary result)   Collection Time: 11/24/2016  5:09 PM  Result Value Ref Range Status   Specimen Description BLOOD RIGHT HAND  Final   Special Requests   Final    BOTTLES DRAWN AEROBIC ONLY Blood Culture results may not be optimal due to an inadequate volume of blood received in culture bottles   Culture NO GROWTH 2 DAYS  Final   Report Status PENDING  Incomplete  MRSA PCR Screening     Status: None   Collection  Time: 11/15/16 12:18 AM  Result Value Ref Range Status   MRSA by PCR NEGATIVE NEGATIVE Final    Comment:        The GeneXpert MRSA Assay (FDA approved for NASAL specimens only), is one component of a comprehensive MRSA colonization surveillance program. It is not intended to diagnose MRSA infection nor to guide or monitor treatment for MRSA infections.     Radiology Reports Dg Chest 2 View  Result Date: 11/10/2016 CLINICAL DATA:  Weakness.  Missed dialysis. EXAM: CHEST  2 VIEW COMPARISON:  05/16/2010 FINDINGS: Cardiomegaly accentuated by low lung volumes. Aortic atherosclerosis and vascular pedicle widening. Mild atelectasis seen in lateral projection as anterior streaky densities. There is no edema, air bronchogram, effusion, or pneumothorax. Exaggerated thoracic kyphosis. Limited visualization of vertebral bodies due to soft tissue attenuation. IMPRESSION: 1. Low lung volumes and mild atelectasis. Negative for edema or definitive pneumonia. 2. Cardiomegaly. Electronically Signed   By: Marnee SpringJonathon  Watts M.D.   On: 11/10/2016 13:13   Ct Head Wo Contrast  Result Date: 11/05/2016 CLINICAL DATA:  Syncope and unresponsiveness. EXAM: CT HEAD WITHOUT CONTRAST TECHNIQUE: Contiguous axial images were obtained from the base of the skull through the vertex without intravenous contrast. COMPARISON:  Report from outside brain MRI dated 06/19/2013, images unavailable for review at the time of dictation. FINDINGS: Brain: There is a hyperdensity at the foramina of [place] measuring 17 mm AP x 16 mm transverse. According to the outside MRI report, the lesion measures 17 mm AP previously. There is moderate hydrocephalus with mild periventricular hypoattenuation. No hemorrhage. No extra-axial collection. 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 Vascular: By report, the patient has a right MCA bifurcation aneurysm. This is not visible on this study. The vessels are otherwise unremarkable aside from  atherosclerotic calcification at the skullbase. Skull: Normal visualized skull base, calvarium and extracranial soft tissues. Sinuses/Orbits: No sinus fluid levels or advanced mucosal thickening. No mastoid effusion. Normal orbits. IMPRESSION: 1. 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 The AP dimension is  unchanged according to the outside MRI report of 06/19/2013. Without comparison images, it is impossible to know the acuity of the hydrocephalus 2. Periventricular hypoattenuation is suggestive of chronic microvascular ischemia. However, in the setting of hydrocephalus, a component of transependymal interstitial edema would be difficult to exclude. 3. Neurosurgical consultation may be appropriate, as colloid cysts may increase in size over time pain can be a source acute obstructive hydrocephalus. Electronically Signed   By: Deatra Robinson M.D.   On: 2016/12/03 22:59   Dg Chest Port 1 View  Result Date: Dec 03, 2016 CLINICAL DATA:  Shortness of breath during dialysis therapy EXAM: PORTABLE CHEST 1 VIEW COMPARISON:  11/10/2016 FINDINGS: Cardiac shadow is again enlarged. Aortic calcifications are again seen. Right lung is well aerated without focal infiltrate. Enlarging left-sided pleural effusion and likely underlying atelectasis is seen. No bony abnormality is noted. IMPRESSION: Enlarging left basilar pleural effusion and likely atelectasis. Electronically Signed   By: Alcide Clever M.D.   On: 2016-12-03 18:51    Time Spent in minutes  30   Susa Raring M.D on 10/29/2016 at 7:18 AM  Between 7am to 7pm - Pager - [phone] ( page via [email or web address], text pages only, please mention full 10 digit call back number). After 7pm go to [email or web address] - password [email or web address]

## 2016-11-27 NOTE — Care Management Note (Signed)
Case Management Note  Patient Details  Name: [name] FastSheila A Lesesne MRN: [id number] Date of Birth: 04/30/1956  Subjective/Objective:     Expired.               Action/Plan:   Expected Discharge Date:  11/25/2016               Expected Discharge Plan:  Expired  In-House Referral:     Discharge planning Services  CM Consult  Post Acute Care Choice:    Choice offered to:     DME Arranged:    DME Agency:     HH Arranged:    HH Agency:     Status of Service:  Completed, signed off  If discussed at [employer] Length [employer] Stay Meetings, dates discussed:    Additional Comments:  Leone Havenaylor, Dalya Maselli [place], RN 11/03/2016, 2:29 PM

## 2016-11-27 DEATH — deceased
